# Patient Record
Sex: Female | Born: 1945
Health system: Southern US, Community
[De-identification: ages and names within clinical notes are randomized; demographics above are authoritative.]

## PROBLEM LIST (undated history)

## (undated) DIAGNOSIS — M4712 Other spondylosis with myelopathy, cervical region: Secondary | ICD-10-CM

## (undated) DIAGNOSIS — T8859XA Other complications of anesthesia, initial encounter: Secondary | ICD-10-CM

## (undated) DIAGNOSIS — F329 Major depressive disorder, single episode, unspecified: Secondary | ICD-10-CM

## (undated) DIAGNOSIS — I639 Cerebral infarction, unspecified: Secondary | ICD-10-CM

## (undated) DIAGNOSIS — R51 Headache: Secondary | ICD-10-CM

## (undated) DIAGNOSIS — E119 Type 2 diabetes mellitus without complications: Secondary | ICD-10-CM

## (undated) DIAGNOSIS — K859 Acute pancreatitis without necrosis or infection, unspecified: Secondary | ICD-10-CM

## (undated) DIAGNOSIS — R413 Other amnesia: Secondary | ICD-10-CM

## (undated) DIAGNOSIS — F32A Depression, unspecified: Secondary | ICD-10-CM

## (undated) DIAGNOSIS — I10 Essential (primary) hypertension: Secondary | ICD-10-CM

## (undated) DIAGNOSIS — Z973 Presence of spectacles and contact lenses: Secondary | ICD-10-CM

## (undated) DIAGNOSIS — R519 Headache, unspecified: Secondary | ICD-10-CM

## (undated) DIAGNOSIS — Z972 Presence of dental prosthetic device (complete) (partial): Secondary | ICD-10-CM

## (undated) DIAGNOSIS — T4145XA Adverse effect of unspecified anesthetic, initial encounter: Secondary | ICD-10-CM

## (undated) DIAGNOSIS — J449 Chronic obstructive pulmonary disease, unspecified: Secondary | ICD-10-CM

## (undated) DIAGNOSIS — R0600 Dyspnea, unspecified: Secondary | ICD-10-CM

## (undated) DIAGNOSIS — E1140 Type 2 diabetes mellitus with diabetic neuropathy, unspecified: Secondary | ICD-10-CM

## (undated) DIAGNOSIS — F419 Anxiety disorder, unspecified: Secondary | ICD-10-CM

## (undated) DIAGNOSIS — F209 Schizophrenia, unspecified: Secondary | ICD-10-CM

## (undated) HISTORY — PX: RHINOPLASTY: SUR1284

## (undated) HISTORY — PX: TONSILLECTOMY: SUR1361

## (undated) HISTORY — PX: FOOT SURGERY: SHX648

## (undated) HISTORY — PX: MULTIPLE TOOTH EXTRACTIONS: SHX2053

## (undated) HISTORY — PX: TUBAL LIGATION: SHX77

## (undated) HISTORY — PX: ROTATOR CUFF REPAIR: SHX139

---

## 2013-08-02 DIAGNOSIS — Q618 Other cystic kidney diseases: Secondary | ICD-10-CM | POA: Diagnosis not present

## 2013-08-02 DIAGNOSIS — L723 Sebaceous cyst: Secondary | ICD-10-CM | POA: Diagnosis not present

## 2013-08-02 DIAGNOSIS — K8689 Other specified diseases of pancreas: Secondary | ICD-10-CM | POA: Diagnosis not present

## 2013-08-02 DIAGNOSIS — K7689 Other specified diseases of liver: Secondary | ICD-10-CM | POA: Diagnosis not present

## 2013-08-02 DIAGNOSIS — N281 Cyst of kidney, acquired: Secondary | ICD-10-CM | POA: Diagnosis not present

## 2013-08-09 DIAGNOSIS — R05 Cough: Secondary | ICD-10-CM | POA: Diagnosis not present

## 2013-08-09 DIAGNOSIS — R059 Cough, unspecified: Secondary | ICD-10-CM | POA: Diagnosis not present

## 2013-08-09 DIAGNOSIS — J370 Chronic laryngitis: Secondary | ICD-10-CM | POA: Diagnosis not present

## 2013-08-09 DIAGNOSIS — R498 Other voice and resonance disorders: Secondary | ICD-10-CM | POA: Diagnosis not present

## 2013-08-09 DIAGNOSIS — R51 Headache: Secondary | ICD-10-CM | POA: Diagnosis not present

## 2014-05-01 DIAGNOSIS — F319 Bipolar disorder, unspecified: Secondary | ICD-10-CM | POA: Diagnosis not present

## 2014-05-01 DIAGNOSIS — F331 Major depressive disorder, recurrent, moderate: Secondary | ICD-10-CM | POA: Diagnosis not present

## 2014-05-01 DIAGNOSIS — E119 Type 2 diabetes mellitus without complications: Secondary | ICD-10-CM | POA: Diagnosis not present

## 2014-05-01 DIAGNOSIS — Z23 Encounter for immunization: Secondary | ICD-10-CM | POA: Diagnosis not present

## 2014-05-01 DIAGNOSIS — I119 Hypertensive heart disease without heart failure: Secondary | ICD-10-CM | POA: Diagnosis not present

## 2014-05-01 DIAGNOSIS — E782 Mixed hyperlipidemia: Secondary | ICD-10-CM | POA: Diagnosis not present

## 2014-05-01 DIAGNOSIS — R413 Other amnesia: Secondary | ICD-10-CM | POA: Diagnosis not present

## 2014-05-01 DIAGNOSIS — F3170 Bipolar disorder, currently in remission, most recent episode unspecified: Secondary | ICD-10-CM | POA: Diagnosis not present

## 2014-05-28 DIAGNOSIS — R6889 Other general symptoms and signs: Secondary | ICD-10-CM | POA: Diagnosis not present

## 2014-06-24 DIAGNOSIS — J019 Acute sinusitis, unspecified: Secondary | ICD-10-CM | POA: Diagnosis not present

## 2014-06-24 DIAGNOSIS — H6593 Unspecified nonsuppurative otitis media, bilateral: Secondary | ICD-10-CM | POA: Diagnosis not present

## 2014-06-24 DIAGNOSIS — R05 Cough: Secondary | ICD-10-CM | POA: Diagnosis not present

## 2014-10-16 DIAGNOSIS — K5901 Slow transit constipation: Secondary | ICD-10-CM | POA: Diagnosis not present

## 2014-10-16 DIAGNOSIS — I119 Hypertensive heart disease without heart failure: Secondary | ICD-10-CM | POA: Diagnosis not present

## 2014-10-16 DIAGNOSIS — G4700 Insomnia, unspecified: Secondary | ICD-10-CM | POA: Diagnosis not present

## 2014-10-16 DIAGNOSIS — F331 Major depressive disorder, recurrent, moderate: Secondary | ICD-10-CM | POA: Diagnosis not present

## 2014-10-16 DIAGNOSIS — F3170 Bipolar disorder, currently in remission, most recent episode unspecified: Secondary | ICD-10-CM | POA: Diagnosis not present

## 2014-10-16 DIAGNOSIS — Z23 Encounter for immunization: Secondary | ICD-10-CM | POA: Diagnosis not present

## 2014-10-16 DIAGNOSIS — E119 Type 2 diabetes mellitus without complications: Secondary | ICD-10-CM | POA: Diagnosis not present

## 2014-10-16 DIAGNOSIS — J45909 Unspecified asthma, uncomplicated: Secondary | ICD-10-CM | POA: Diagnosis not present

## 2014-10-16 DIAGNOSIS — R413 Other amnesia: Secondary | ICD-10-CM | POA: Diagnosis not present

## 2014-10-16 DIAGNOSIS — E782 Mixed hyperlipidemia: Secondary | ICD-10-CM | POA: Diagnosis not present

## 2015-04-20 ENCOUNTER — Encounter (HOSPITAL_COMMUNITY): Payer: Self-pay

## 2015-04-20 ENCOUNTER — Emergency Department (HOSPITAL_COMMUNITY)
Admission: EM | Admit: 2015-04-20 | Discharge: 2015-04-20 | Disposition: A | Discharge status: Home / Self Care | Payer: Medicare Other | Attending: Emergency Medicine | Admitting: Emergency Medicine

## 2015-04-20 ENCOUNTER — Emergency Department (HOSPITAL_COMMUNITY): Payer: Medicare Other

## 2015-04-20 DIAGNOSIS — S0990XA Unspecified injury of head, initial encounter: Secondary | ICD-10-CM | POA: Diagnosis present

## 2015-04-20 DIAGNOSIS — S40022A Contusion of left upper arm, initial encounter: Secondary | ICD-10-CM | POA: Diagnosis not present

## 2015-04-20 DIAGNOSIS — Y9389 Activity, other specified: Secondary | ICD-10-CM | POA: Insufficient documentation

## 2015-04-20 DIAGNOSIS — Y9289 Other specified places as the place of occurrence of the external cause: Secondary | ICD-10-CM | POA: Diagnosis not present

## 2015-04-20 DIAGNOSIS — Y998 Other external cause status: Secondary | ICD-10-CM | POA: Diagnosis not present

## 2015-04-20 DIAGNOSIS — S8011XA Contusion of right lower leg, initial encounter: Secondary | ICD-10-CM | POA: Diagnosis not present

## 2015-04-20 DIAGNOSIS — W19XXXA Unspecified fall, initial encounter: Secondary | ICD-10-CM

## 2015-04-20 DIAGNOSIS — Z87891 Personal history of nicotine dependence: Secondary | ICD-10-CM | POA: Diagnosis not present

## 2015-04-20 DIAGNOSIS — S0101XA Laceration without foreign body of scalp, initial encounter: Secondary | ICD-10-CM | POA: Insufficient documentation

## 2015-04-20 DIAGNOSIS — M542 Cervicalgia: Secondary | ICD-10-CM | POA: Diagnosis not present

## 2015-04-20 DIAGNOSIS — S199XXA Unspecified injury of neck, initial encounter: Secondary | ICD-10-CM | POA: Diagnosis not present

## 2015-04-20 DIAGNOSIS — S40021A Contusion of right upper arm, initial encounter: Secondary | ICD-10-CM | POA: Diagnosis not present

## 2015-04-20 DIAGNOSIS — E119 Type 2 diabetes mellitus without complications: Secondary | ICD-10-CM | POA: Diagnosis not present

## 2015-04-20 DIAGNOSIS — W01198A Fall on same level from slipping, tripping and stumbling with subsequent striking against other object, initial encounter: Secondary | ICD-10-CM | POA: Insufficient documentation

## 2015-04-20 DIAGNOSIS — I10 Essential (primary) hypertension: Secondary | ICD-10-CM | POA: Diagnosis not present

## 2015-04-20 DIAGNOSIS — Z8673 Personal history of transient ischemic attack (TIA), and cerebral infarction without residual deficits: Secondary | ICD-10-CM | POA: Diagnosis not present

## 2015-04-20 DIAGNOSIS — S0003XA Contusion of scalp, initial encounter: Secondary | ICD-10-CM | POA: Diagnosis not present

## 2015-04-20 HISTORY — DX: Type 2 diabetes mellitus without complications: E11.9

## 2015-04-20 HISTORY — DX: Essential (primary) hypertension: I10

## 2015-04-20 HISTORY — DX: Cerebral infarction, unspecified: I63.9

## 2015-04-20 LAB — CBG MONITORING, ED: GLUCOSE-CAPILLARY: 180 mg/dL — AB (ref 65–99)

## 2015-04-20 MED ORDER — LIDOCAINE HCL 2 % IJ SOLN
10.0000 mL | Freq: Once | INTRAMUSCULAR | Status: AC
  Administered 2015-04-20: 200 mg via INTRADERMAL
  Filled 2015-04-20: qty 20

## 2015-04-20 NOTE — ED Notes (Signed)
Pt fell this morning getting back to bed.  Struck back of head on something.  Has laceration.  Bleeding controlled.  Pt on blood thinner.  Pt states she had no LOC but fall was unwitnessed and pt states she could hear everyone but she "dozed off".  Spouse heard her and came to check on her.

## 2015-04-20 NOTE — ED Provider Notes (Signed)
LACERATION REPAIR Performed by: Simeon Craft Authorized by: Simeon Craft Consent: Verbal consent obtained. Risks and benefits: risks, benefits and alternatives were discussed Consent given by: patient Patient identity confirmed: provided demographic data Prepped and Draped in normal sterile fashion Wound explored  Laceration Location: Occipital scalp  Laceration Length: 3 cm  No Foreign Bodies seen or palpated  Anesthesia: local infiltration  Local anesthetic: lidocaine 2% without epinephrine  Anesthetic total: 6 ml  Irrigation method: syringe Amount of cleaning: standard  Skin closure: approximated well  Number of staples: 4  Technique: staples  Patient tolerance: Patient tolerated the procedure well with no immediate complications.  Alveta Heimlich, PA-C 04/20/15 1220  Doug Sou, MD 04/20/15 1610

## 2015-04-20 NOTE — ED Notes (Signed)
MD at bedside. 

## 2015-04-20 NOTE — ED Provider Notes (Signed)
CSN: [id number]     Arrival date & time 04/20/15  0806 History   First MD Initiated Contact with Patient 04/20/15 [id number]     Chief Complaint  Patient presents with  . Fall  . Head Laceration     (Consider location/radiation/quality/duration/timing/severity/associated sxs/prior Treatment) HPI Patient fell while getting into bed 5 AM today. She struck her occipital scalp on table next to her bed creating laceration. She complains of headache and neck pain since the event. She is acting as her normal self per her daughters who accompany her. No treatment prior to coming here. Pain is mild at present. No other associated symptoms. It is uncertain if patient is currently on blood thinners. Past Medical History  Diagnosis Date  . Hypertension   . Diabetes mellitus without complication   . Stroke    Past Surgical History  Procedure Laterality Date  . Tubal ligation    . Rotator cuff repair    . Rhinoplasty    . Foot surgery     History reviewed. No pertinent family history. Social History  Substance Use Topics  . Smoking status: Former Games developer  . Smokeless tobacco: None  . Alcohol Use: No   OB History    No data available     Review of Systems  Musculoskeletal: Positive for gait problem and neck pain.       Walks with walker  Neurological: Positive for headaches.      Allergies  Review of patient's allergies indicates no known allergies.  Home Medications   Prior to Admission medications   Not on File   BP 134/70 mmHg  Pulse 67  Temp(Src) 98 F (36.7 C) (Oral)  Resp 18  SpO2 99% Physical Exam  Constitutional: No distress.  Chronically ill-appearing  HENT:  Right Ear: External ear normal.  Left Ear: External ear normal.  3 cm stellate laceration at occiput. No surrounding hematoma.  Eyes: Conjunctivae are normal. Pupils are equal, round, and reactive to light.  Neck: Neck supple. No tracheal deviation present. No thyromegaly present.  Mild diffuse tenderness  posteriorly  Cardiovascular: Normal rate.   No murmur heard. Pulmonary/Chest: Effort normal and breath sounds normal.  Abdominal: Soft. Bowel sounds are normal. She exhibits no distension. There is no tenderness.  Musculoskeletal: Normal range of motion. She exhibits no edema or tenderness.  Pelvis stable and nontender. All 4 extremity is a contusion abrasion and tenderness neurovascular intact  Neurological: She is alert. Coordination normal.  Walks with minimal assistance. Her baseline gait per her daughter who accompanies her  Skin: Skin is warm and dry. No rash noted.  Psychiatric: She has a normal mood and affect.  Nursing note and vitals reviewed.   ED Course  Procedures (including critical care time) Labs Review Labs Reviewed - No data to display  Imaging Review No results found. I have personally reviewed and evaluated these images and lab results as part of my medical decision-making.   EKG Interpretation None     Scalp laceration repaired by Ms. Barrett. Results for orders placed or performed during the hospital encounter of 04/20/15  CBG monitoring, ED  Result Value Ref Range   Glucose-Capillary 180 (H) 65 - 99 mg/dL   Comment 1 Notify RN    Comment 2 Document in Chart    Ct Head Wo Contrast  04/20/2015   CLINICAL DATA:  Fall this morning. Blunt trauma to back of head. Head and neck pain. On anticoagulation.  EXAM: CT HEAD WITHOUT CONTRAST  CT  CERVICAL SPINE WITHOUT CONTRAST  TECHNIQUE: Multidetector CT imaging of the head and cervical spine was performed following the standard protocol without intravenous contrast. Multiplanar CT image reconstructions of the cervical spine were also generated.  COMPARISON:  None.  FINDINGS: CT HEAD FINDINGS  There is no evidence of intracranial hemorrhage, brain edema, or other signs of acute infarction. There is no evidence of intracranial mass lesion or mass effect. No abnormal extraaxial fluid collections are identified.  Mild  chronic small vessel disease noted. No evidence of hydrocephalus. Mild posterior scalp soft tissue swelling and subcutaneous emphysema noted however there is no evidence of skull fracture or pneumocephalus.  CT CERVICAL SPINE FINDINGS  No evidence of acute fracture, subluxation, or prevertebral soft tissue swelling.  New moderate to severe degenerative disc disease seen at C3-4, C5-6, and C6-7. Mild facet DJD also seen bilaterally at multiple levels. Atlantoaxial degenerative changes noted. In addition, calcified disc herniations and spinal canal stenosis seen at levels of C3-4, C4-5, C5-6, and C6-7.  IMPRESSION: Posterior scalp hematoma. No evidence of skull fracture or acute intracranial findings. Mild chronic small vessel disease.  No evidence of acute cervical spine fracture or subluxation.  Degenerative cervical spondylosis, as described above. Calcified posterior disc herniations and spinal canal stenosis seen at levels of C3 through C7.   Electronically Signed   By: Myles Rosenthal M.D.   On: 04/20/2015 10:15   Ct Cervical Spine Wo Contrast  04/20/2015   CLINICAL DATA:  Fall this morning. Blunt trauma to back of head. Head and neck pain. On anticoagulation.  EXAM: CT HEAD WITHOUT CONTRAST  CT CERVICAL SPINE WITHOUT CONTRAST  TECHNIQUE: Multidetector CT imaging of the head and cervical spine was performed following the standard protocol without intravenous contrast. Multiplanar CT image reconstructions of the cervical spine were also generated.  COMPARISON:  None.  FINDINGS: CT HEAD FINDINGS  There is no evidence of intracranial hemorrhage, brain edema, or other signs of acute infarction. There is no evidence of intracranial mass lesion or mass effect. No abnormal extraaxial fluid collections are identified.  Mild chronic small vessel disease noted. No evidence of hydrocephalus. Mild posterior scalp soft tissue swelling and subcutaneous emphysema noted however there is no evidence of skull fracture or  pneumocephalus.  CT CERVICAL SPINE FINDINGS  No evidence of acute fracture, subluxation, or prevertebral soft tissue swelling.  New moderate to severe degenerative disc disease seen at C3-4, C5-6, and C6-7. Mild facet DJD also seen bilaterally at multiple levels. Atlantoaxial degenerative changes noted. In addition, calcified disc herniations and spinal canal stenosis seen at levels of C3-4, C4-5, C5-6, and C6-7.  IMPRESSION: Posterior scalp hematoma. No evidence of skull fracture or acute intracranial findings. Mild chronic small vessel disease.  No evidence of acute cervical spine fracture or subluxation.  Degenerative cervical spondylosis, as described above. Calcified posterior disc herniations and spinal canal stenosis seen at levels of C3 through C7.   Electronically Signed   By: Myles Rosenthal M.D.   On: 04/20/2015 10:15    MDM  Plan Cherlynn Polo out one week. Tylenol for pain. She'll be given resource guide and [facility] as referrals for to get Medicare physician Diagnosis #1 fall #2 minor closed head trauma #3 scalp laceration  #4 hyperglycemia  Final diagnoses:  None        Doug Sou, MD 04/20/15 1139

## 2015-04-20 NOTE — Discharge Instructions (Signed)
Fall Prevention and Home Safety Take Tylenol as directed for pain. You can wash your hair normally in 24 hours. Staples to come out in 1 week. You can go to any urgent care center to get them out. Call the [facility] and community wellness center or any of the numbers on the resource guide to get a primary care physician Falls cause injuries and can affect all age groups. It is possible to use preventive measures to significantly decrease the likelihood of falls. There are many simple measures which can make your home safer and prevent falls. OUTDOORS  Repair cracks and edges of walkways and driveways.  Remove high doorway thresholds.  Trim shrubbery on the main path into your home.  Have good outside lighting.  Clear walkways of tools, rocks, debris, and clutter.  Check that handrails are not broken and are securely fastened. Both sides of steps should have handrails.  Have leaves, snow, and ice cleared regularly.  Use sand or salt on walkways during winter months.  In the garage, clean up grease or oil spills. BATHROOM  Install night lights.  Install grab bars by the toilet and in the tub and shower.  Use non-skid mats or decals in the tub or shower.  Place a plastic non-slip stool in the shower to sit on, if needed.  Keep floors dry and clean up all water on the floor immediately.  Remove soap buildup in the tub or shower on a regular basis.  Secure bath mats with non-slip, double-sided rug tape.  Remove throw rugs and tripping hazards from the floors. BEDROOMS  Install night lights.  Make sure a bedside light is easy to reach.  Do not use oversized bedding.  Keep a telephone by your bedside.  Have a firm chair with side arms to use for getting dressed.  Remove throw rugs and tripping hazards from the floor. KITCHEN  Keep handles on pots and pans turned toward the center of the stove. Use back burners when possible.  Clean up spills quickly and allow time  for drying.  Avoid walking on wet floors.  Avoid hot utensils and knives.  Position shelves so they are not too high or low.  Place commonly used objects within easy reach.  If necessary, use a sturdy step stool with a grab bar when reaching.  Keep electrical cables out of the way.  Do not use floor polish or wax that makes floors slippery. If you must use wax, use non-skid floor wax.  Remove throw rugs and tripping hazards from the floor. STAIRWAYS  Never leave objects on stairs.  Place handrails on both sides of stairways and use them. Fix any loose handrails. Make sure handrails on both sides of the stairways are as long as the stairs.  Check carpeting to make sure it is firmly attached along stairs. Make repairs to worn or loose carpet promptly.  Avoid placing throw rugs at the top or bottom of stairways, or properly secure the rug with carpet tape to prevent slippage. Get rid of throw rugs, if possible.  Have an electrician put in a light switch at the top and bottom of the stairs. OTHER FALL PREVENTION TIPS  Wear low-heel or rubber-soled shoes that are supportive and fit well. Wear closed toe shoes.  When using a stepladder, make sure it is fully opened and both spreaders are firmly locked. Do not climb a closed stepladder.  Add color or contrast paint or tape to grab bars and handrails in your  home. Place contrasting color strips on first and last steps.  Learn and use mobility aids as needed. Install an electrical emergency response system.  Turn on lights to avoid dark areas. Replace light bulbs that burn out immediately. Get light switches that glow.  Arrange furniture to create clear pathways. Keep furniture in the same place.  Firmly attach carpet with non-skid or double-sided tape.  Eliminate uneven floor surfaces.  Select a carpet pattern that does not visually hide the edge of steps.  Be aware of all pets. OTHER HOME SAFETY TIPS  Set the water  temperature for 120 F (48.8 C).  Keep emergency numbers on or near the telephone.  Keep smoke detectors on every level of the home and near sleeping areas. Document Released: 07/08/2002 Document Revised: 01/17/2012 Document Reviewed: 10/07/2011 [facility] Patient Information 2015 [place], [place]. This information is not intended to replace advice given to you by your health care provider. Make sure you discuss any questions you have with your health care provider.  Emergency Department Resource Guide 1) Find a Doctor and Pay Out of Pocket Although you won't have to find out who is covered by your insurance plan, it is a good idea to ask around and get recommendations. You will then need to call the office and see if the doctor you have chosen will accept you as a new patient and what types of options they offer for patients who are self-pay. Some doctors offer discounts or will set up payment plans for their patients who do not have insurance, but you will need to ask so you aren't surprised when you get to your appointment.  2) Contact Your Local Health Department Not all health departments have doctors that can see patients for sick visits, but many do, so it is worth a call to see if yours does. If you don't know where your local health department is, you can check in your phone book. The CDC also has a tool to help you locate your state's health department, and many state websites also have listings of all of their local health departments.  3) Find a Walk-in Clinic If your illness is not likely to be very severe or complicated, you may want to try a walk in clinic. These are popping up all over the country in pharmacies, drugstores, and shopping centers. They're usually staffed by nurse practitioners or physician assistants that have been trained to treat common illnesses and complaints. They're usually fairly quick and inexpensive. However, if you have serious medical issues or chronic medical  problems, these are probably not your best option.  No Primary Care Doctor: - Call Health Connect at  [phone] - they can help you locate a primary care doctor that  accepts your insurance, provides certain services, etc. - Physician Referral Service- [phone]  Chronic Pain Problems: Organization         Address  Phone   Notes  [facility]  [phone] Patients need to be referred by their primary care doctor.   Medication Assistance: Organization         Address  Phone   Notes  [facility] Medication [facility] [address] [place] [place], [place] [address] [phone] --Must be a resident of [facility] -- Must have NO insurance coverage whatsoever (no Medicaid/ Medicare, etc.) -- The pt. MUST have a primary care doctor that directs their care regularly and follows them in the community   [facility]  [phone]  [employer]  [phone]    Agencies that provide inexpensive medical care: Organization         Address  Phone   Notes  [facility]  [phone]   [facility]    [phone]   [facility] [address] [place], [place] [address] [phone]   [facility] [address] [place] [place] [phone]   Planned Parenthood    [phone]   [facility]    [phone]   [facility]  [address], [place] Phone:  [phone], Fax:  [phone] Hours of Operation:  9 am - 6 pm, M-F.  Also accepts Medicaid/Medicare and self-pay.  [facility]  [address], [place] Phone: [phone], Fax: [phone]. Hours of Operation:  8:30 am - 5:30 pm, M-F.  Also accepts Medicaid and self-pay.  [facility] [place] [address] [place] Phone: [phone]   [facility] [address] Natasha Bence [place], [place] [phone], Ext. [phone]  Mondays & Thursdays: 7-9 AM.  First 15 patients are seen on a first come, first serve basis.    Medicaid-accepting [facility] Providers:  Organization         Address  Phone   Notes  [facility] [address], [place] [phone] Also accepts self-pay patients.  [facility] [address] Laurell Josephs [place], [place]  [phone]   [facility] [address], [place] [phone]   [facility] [address], [place] [phone]   Renaye Rakers [address], [place]   [phone] Only accepts [place] Access [place] patients after they have their name applied to their card.   Self-Pay (no insurance) in [facility]:  Organization         Address  Phone   Notes  Sickle Cell Patients, [facility] Internal Medicine [address] [place], [place] [phone]   [facility] [address] [place], [place] [phone]   [facility] [place]  [address], [place] [phone]   [facility]/Dr. Osei-Bonsu  [address] [place] or [address], [place] [phone] Phone number for both [place] and [place] locations is the same.  Urgent Medical and [facility] [address], [place] [phone]   [facility] [address], [place] or [address] [phone]   [facility] [address], [place] [phone], phone; [phone], fax Sees patients 1st and 3rd Saturday of every month.  Must not qualify for public or private insurance (i.e. Medicaid, Medicare, [place] Health Choice, Veterans' Benefits)  Household income should be no more than 200% of the poverty level The clinic cannot treat you if you are pregnant or think you are pregnant  Sexually transmitted diseases are not treated at  the clinic.    Dental Care: Organization         Address  Phone  Notes  [facility] [address] [place], [place] [phone] Accepts children up to age 77 who are enrolled in [place] or [place] Health Choice; pregnant women with a Medicaid card;  and children who have applied for Medicaid or [place] Health Choice, but were declined, whose parents can pay a reduced fee at time of service.  [facility]  [address], [place] [phone] Accepts children up to age 109 who are enrolled in [place] or [place] Health Choice; pregnant women with a Medicaid card; and children who have applied for Medicaid or [place] Health Choice, but were declined, whose parents can pay a reduced fee at time of service.  [facility]  [address] [place], [place] [phone] Patients are seen by appointment only. Walk-ins are not accepted. [facility] will see patients 2 years of age and older. Monday - Tuesday (8am-5pm) Most Wednesdays (8:30-5pm) $30 per visit, cash only  [facility] Adult Dental Access PRO[facility]  [address], [facility] [phone] Patients are seen by appointment only. Walk-ins are not accepted. [facility] will see patients 53 years of age and older. One Wednesday Evening (Monthly: Volunteer Based).  $30 per visit, cash only  [employer]  [phone] for adults; Children under age 47, call Graduate Pediatric Dentistry at [phone]. Children aged 67-14, please call [phone] to request a pediatric application.  Dental services are provided in all areas of dental care including fillings, crowns and bridges, complete and partial dentures, implants, gum treatment, root canals, and extractions. Preventive care is also provided. Treatment is provided to both adults and children. Patients are selected via a lottery and there is often a  waiting list.   [facility] [address], [place]  [phone] [email or web address]   [facility] [address] [place], [place] [phone], Ext. [phone] Second and Fourth Thursday of each month, opens at 6:30 AM; Clinic ends at 9 AM.  Patients are seen on a first-come first-served basis, and a limited number are seen during each clinic.   [facility]  [address] Ether Griffins [place], [place] [phone]   Eligibility Requirements You must have lived in [place], [place], or [place] counties for at least the last three months.   You cannot be eligible for state or federal sponsored [place], including [employer], [place], or [employer].   You generally cannot be eligible for healthcare insurance through your employer.    How to apply: Eligibility screenings are held every Tuesday and Wednesday afternoon from 1:00 pm until 4:00 pm. You do not need an appointment for the interview!  [facility] [address] [place], [place] [phone]   [facility] Health Department  [phone]   [facility] Health Department  [phone]   [facility]  [phone]    Behavioral Health Resources in the Community: Intensive Outpatient Programs Organization         Address  Phone  Notes  [facility] [address], [place], [place] [phone]   [facility] Outpatient [address] [place], [place] [phone]   [facility] [address] [place], [place]  [phone]   [facility] Mental Health [address],  [place], [place] [phone] or [phone]   Substance Abuse Resources Organization         Address  Phone  Notes  [facility]  [phone]   [facility]  [phone]   [facility]  [phone]   [facility]  [phone]   Residential &  Outpatient Substance Abuse  Program  [phone]   Psychological Services Organization         Address  Phone  Notes  [facility] Behavioral Health  [phone]   [facility]  [phone]   [facility] Mental Health [phone] [address] [place] [phone] or [phone]    Mobile Crisis Teams Organization         Address  Phone  Notes  [facility], Mobile Crisis Care Unit  [phone]   [facility]  [address] [place], [place] [phone]   Doristine Locks [address] [place] [phone]    Self-Help/Support Groups Organization         Address  Phone             Notes  [facility] [place] - variety of support groups  336- I7437963 Call for more information  Narcotics Anonymous (NA), [facility] [address], [employer] [place]  2 meetings at this location   Statistician         Address  Phone  Notes  [facility] [address],    [place]  [phone]   [facility]  [address], [place] [address], [place], [place] [phone]   [facility] [address] [place], [place] [phone] Admissions: 8am-3pm M-F  [facility] [address],    [place], [place] [phone]   [facility] [address] [place], [place], [place] [phone]   [facility] [address],  [place], [place] [phone]   [facility] - Intensive Outpatient [address] Laurell Josephs [address], [place], [place] [phone]   [facility] (Addiction [facility] [address] [place]  [place], [place] [phone] or [phone]   [facility] ([facility]) [address] [place], [place] [phone] Accepts Medicaid  [facility] [place] [address],  [place] [phone] Substance Abuse/Addiction Treatment   [facility] Organization          Address  Phone  Notes  [facility]  [phone]   Angie Fava, PhD [address] Ervin Knack [place], [place]   [phone] or [phone]   [facility] Behavioral   [address] [place], [place] [phone]   [facility] [address], [place], [place] [phone] Insurance/Medicaid/sponsorship through [facility] and Families [address]                                    [place], [place] [phone] Therapy/tele-psych/case  [facility] [address], [place] [phone]    Dr. Lolly Mustache  [phone]   [facility]  [employer] [facility] Dept. 1) [address] [place] 2) [address] [place] 3)  [address], [place] [phone]  [phone]   [facility] Child Abuse Hotline 669-409-6565 or [phone] (After Hours)

## 2015-04-20 NOTE — ED Notes (Signed)
Patient transported to CT 

## 2015-04-27 DIAGNOSIS — R112 Nausea with vomiting, unspecified: Secondary | ICD-10-CM | POA: Diagnosis not present

## 2015-04-27 DIAGNOSIS — Z4802 Encounter for removal of sutures: Secondary | ICD-10-CM | POA: Diagnosis not present

## 2015-05-15 DIAGNOSIS — M6281 Muscle weakness (generalized): Secondary | ICD-10-CM | POA: Diagnosis not present

## 2015-05-15 DIAGNOSIS — F331 Major depressive disorder, recurrent, moderate: Secondary | ICD-10-CM | POA: Diagnosis not present

## 2015-05-15 DIAGNOSIS — R42 Dizziness and giddiness: Secondary | ICD-10-CM | POA: Diagnosis not present

## 2015-05-15 DIAGNOSIS — E538 Deficiency of other specified B group vitamins: Secondary | ICD-10-CM | POA: Diagnosis not present

## 2015-05-15 DIAGNOSIS — I10 Essential (primary) hypertension: Secondary | ICD-10-CM | POA: Diagnosis not present

## 2015-05-15 DIAGNOSIS — Z23 Encounter for immunization: Secondary | ICD-10-CM | POA: Diagnosis not present

## 2015-05-15 DIAGNOSIS — Z79899 Other long term (current) drug therapy: Secondary | ICD-10-CM | POA: Diagnosis not present

## 2015-05-15 DIAGNOSIS — E119 Type 2 diabetes mellitus without complications: Secondary | ICD-10-CM | POA: Diagnosis not present

## 2016-01-01 DIAGNOSIS — Z7984 Long term (current) use of oral hypoglycemic drugs: Secondary | ICD-10-CM | POA: Diagnosis not present

## 2016-01-01 DIAGNOSIS — Z79899 Other long term (current) drug therapy: Secondary | ICD-10-CM | POA: Diagnosis not present

## 2016-01-01 DIAGNOSIS — E7800 Pure hypercholesterolemia, unspecified: Secondary | ICD-10-CM | POA: Diagnosis not present

## 2016-01-01 DIAGNOSIS — K5901 Slow transit constipation: Secondary | ICD-10-CM | POA: Diagnosis not present

## 2016-01-01 DIAGNOSIS — E119 Type 2 diabetes mellitus without complications: Secondary | ICD-10-CM | POA: Diagnosis not present

## 2016-01-01 DIAGNOSIS — R269 Unspecified abnormalities of gait and mobility: Secondary | ICD-10-CM | POA: Diagnosis not present

## 2016-01-01 DIAGNOSIS — E538 Deficiency of other specified B group vitamins: Secondary | ICD-10-CM | POA: Diagnosis not present

## 2016-01-01 DIAGNOSIS — F331 Major depressive disorder, recurrent, moderate: Secondary | ICD-10-CM | POA: Diagnosis not present

## 2016-01-01 DIAGNOSIS — E1165 Type 2 diabetes mellitus with hyperglycemia: Secondary | ICD-10-CM | POA: Diagnosis not present

## 2016-01-26 DIAGNOSIS — J069 Acute upper respiratory infection, unspecified: Secondary | ICD-10-CM | POA: Diagnosis not present

## 2016-01-26 DIAGNOSIS — R269 Unspecified abnormalities of gait and mobility: Secondary | ICD-10-CM | POA: Diagnosis not present

## 2016-02-19 ENCOUNTER — Encounter: Payer: Self-pay | Admitting: Neurology

## 2016-02-19 ENCOUNTER — Ambulatory Visit (INDEPENDENT_AMBULATORY_CARE_PROVIDER_SITE_OTHER): Payer: Medicare Other | Admitting: Neurology

## 2016-02-19 ENCOUNTER — Other Ambulatory Visit (INDEPENDENT_AMBULATORY_CARE_PROVIDER_SITE_OTHER): Payer: Medicare Other

## 2016-02-19 VITALS — BP 140/84 | HR 64 | Ht 64.0 in | Wt 134.2 lb

## 2016-02-19 DIAGNOSIS — F1010 Alcohol abuse, uncomplicated: Secondary | ICD-10-CM

## 2016-02-19 DIAGNOSIS — F1011 Alcohol abuse, in remission: Secondary | ICD-10-CM

## 2016-02-19 DIAGNOSIS — H5500 Unspecified nystagmus: Secondary | ICD-10-CM

## 2016-02-19 DIAGNOSIS — R292 Abnormal reflex: Secondary | ICD-10-CM

## 2016-02-19 DIAGNOSIS — R4189 Other symptoms and signs involving cognitive functions and awareness: Secondary | ICD-10-CM | POA: Diagnosis not present

## 2016-02-19 DIAGNOSIS — G959 Disease of spinal cord, unspecified: Secondary | ICD-10-CM | POA: Diagnosis not present

## 2016-02-19 LAB — C-REACTIVE PROTEIN: CRP: 0.1 mg/dL — AB (ref 0.5–20.0)

## 2016-02-19 LAB — SEDIMENTATION RATE: Sed Rate: 10 mm/hr (ref 0–30)

## 2016-02-19 NOTE — Addendum Note (Signed)
Addended by: Vivien RotaRIVER, Kalub Morillo H on: 4/09/81197/21/2017 02:36 PM   Modules accepted: Orders

## 2016-02-19 NOTE — Progress Notes (Signed)
Note routed

## 2016-02-19 NOTE — Patient Instructions (Signed)
1.  Check blood work 2.  MRI brain and cervical spine 3.  Home health referral will be made 4.  Please call my office with the name and number of her previous psychiatrist so we can obtain their records  We will call you with the results of the testing and determine the next step

## 2016-02-19 NOTE — Progress Notes (Signed)
[facility] Clinic Note - Initial Visit   Date: 02/19/2016  [name] MRN: [id number] DOB: 03/08/1946   Dear Dr. Lauretta Grill:  Thank you for your kind referral of [name] for consultation of bilateral leg weakness and gait instability. Although her history is well known to you, please allow [place] to reiterate it for the purpose of our medical record. The patient was accompanied to the clinic by daughter who also provides collateral information.     History of Present Illness: [name] is a 70 y.o. right-handed [place] American female with hypertension, hyperlipidemia, diabetes mellitus, vitamin B12 deficiency, stroke with residual dysarthria and dysphagia (2014), prior tobacco use, and paranoid schizophrenia presenting for evaluation of gait problems and falls.  She had a stroke in 2014 which manifested with right face and arm weakness, and dysarthria.  Her weakness improved, but she was left with difficulty with speech and swallowing.   Starting ~ 2015, she began having stiffness and difficulty with her walking.  She started using a cane but over the past year has been using rollator.  She fallen four this year with superficial injuries, including scalp laceration which required sutures.   She does not have any pain and denies numbness/tingling of the legs.  She endorses slowed movements, constipation, and vivid dreams.  Smell is intact.  Mood is "up and down".  She has been seeing a psychiatrist for schizophrenia and takes ziprasidone 3m daily.  Her prior antipsychotic medications are unknown.   She used to work in [employer]and retired at the age of 70    She used to drink 1 pint - 1 quart daily for 50 years, but quit drinking in 2013.  She also previously smoked 1-2ppd x 50 years.  When she moved in to live with her daughters, she quit drinking and smoking.  [place]    Out-side paper records, electronic medical record, and images have been reviewed where available and summarized as:   Labs 01/04/2016:  HbA1c 7.5, TSH 1.51, vitamin B12 260  Past Medical History  Diagnosis Date  . Hypertension   . [place]   . Stroke [facility]     Past Surgical History  Procedure Laterality Date  . Tubal ligation    . Rotator cuff repair    . Rhinoplasty    . Foot surgery       Medications:  Outpatient Encounter Prescriptions as of 02/19/2016  Medication Sig Note  . albuterol (PROVENTIL) (2.5 MG/3ML) 0.083% nebulizer solution Take 2.5 mg by nebulization every 6 (six) hours as needed for wheezing or shortness of breath.   .Marland KitchenamLODipine-benazepril (LOTREL) 5-10 MG per capsule Take 1 capsule by mouth daily.   .Marland Kitchenaspirin EC 81 MG tablet Take 81 mg by mouth daily.   . DULoxetine (CYMBALTA) 60 MG capsule Take 60 mg by mouth daily.   . fluticasone (FLONASE) 50 MCG/ACT nasal spray Place 1 spray into both nostrils daily.   . Linaclotide (LINZESS) 145 MCG CAPS capsule Take 145 mcg by mouth daily.   . meclizine (ANTIVERT) 25 MG tablet Take 25 mg by mouth 3 (three) times daily as needed for dizziness.   . metFORMIN (GLUCOPHAGE) 500 MG tablet Take 500 mg by mouth 2 (two) times daily with a meal.   . rosuvastatin (CRESTOR) 20 MG tablet Take 20 mg by mouth daily.   .Marland KitchentiZANidine (ZANAFLEX) 4 MG tablet Take 4 mg  by mouth every 6 (six) hours as needed for muscle spasms.   . traZODone (DESYREL) 150 MG tablet Take 150 mg by mouth at bedtime.   . ziprasidone (GEODON) 80 MG capsule Take 80 mg by mouth at bedtime.    . [DISCONTINUED] Cyanocobalamin (VITAMIN B-12 IJ) Inject 1,000 mcg as directed every 30 (thirty) days. 04/20/2015: 03/02/2015  . [DISCONTINUED] potassium chloride SA (K-DUR,KLOR-CON) 20 MEQ tablet Take 40 mEq by mouth daily.    No facility-administered encounter medications on file as of 02/19/2016.     Allergies: No Known  Allergies  Family History: No family history on file.  Social History: Social History  Substance Use Topics  . Smoking status: Former Research scientist (life sciences)  . Smokeless tobacco: Never Used  . Alcohol Use: No   Social History   Social History Narrative   Lives with 2 sisters in a one story home.  Retired from [employer] work.  Education: GED     Review of Systems:  CONSTITUTIONAL: No fevers, chills, night sweats, or weight loss.   EYES: No visual changes or eye pain ENT: No hearing changes.  No history of nose bleeds.   RESPIRATORY: No cough, wheezing and shortness of breath.   CARDIOVASCULAR: Negative for chest pain, and palpitations.   GI: Negative for abdominal discomfort, blood in stools or black stools.  No recent change in bowel habits.   GU:  No history of incontinence.   MUSCLOSKELETAL: No history of joint pain or swelling.  No myalgias.   SKIN: Negative for lesions, rash, and itching.   HEMATOLOGY/ONCOLOGY: Negative for prolonged bleeding, bruising easily, and swollen nodes.  No history of cancer.   ENDOCRINE: Negative for cold or heat intolerance, polydipsia or goiter.   PSYCH:  +depression or anxiety symptoms.   NEURO: As Above.   Vital Signs:  BP 140/84 mmHg  Pulse 64  Ht 5' 4" (1.626 m)  Wt 134 lb 3 oz (60.867 kg)  BMI 23.02 kg/m2  SpO2 98% Pain Scale: 0 on a scale of 0-10   General Medical Exam:   General:  Thin-appearing, comfortable.   Eyes/ENT: see cranial nerve examination.   Neck: No masses appreciated.  Full range of motion without tenderness.  No carotid bruits. Respiratory:  Clear to auscultation, good air entry bilaterally.   Cardiac:  Regular rate and rhythm, no murmur.   Extremities:  No deformities, edema, or skin discoloration.  Skin:  No rashes or lesions.  Neurological Exam: MENTAL STATUS including orientation to time, place, person, recent and remote memory, attention span and concentration, language, and fund of knowledge is fair. Speech is mildly  dysarthric. Montreal Cognitive Assessment  02/19/2016  Visuospatial/ Executive (0/5) 2  Naming (0/3) 2  Attention: Read list of digits (0/2) 2  Attention: Read list of letters (0/1) 1  Attention: Serial 7 subtraction starting at 100 (0/3) 1  Language: Repeat phrase (0/2) 2  Language : Fluency (0/1) 0  Abstraction (0/2) 1  Delayed Recall (0/5) 1  Orientation (0/6) 5  Total 17  Adjusted Score (based on education) 18    CRANIAL NERVES: II:  No visual field defects.  Unremarkable fundi.   III-IV-VI: Pupils equal round and reactive to light.  Normal conjugate, extra-ocular eye movements in all directions of gaze, except mildly restricted upgaze.  There is nystagmus in horizontal gaze > vertical gaze. Square wave jerks are present.  No ptosis.   V:  Normal facial sensation.  Jaw jerk is present.   VII:  Normal facial symmetry  and movements. Pathologic facial reflexes are present.  VIII:  Normal hearing and vestibular function.   IX-X:  Normal palatal movement.   XI:  Normal shoulder shrug and head rotation.   XII:  Normal tongue strength and range of motion, no deviation or fasciculation.  MOTOR:  Reduced muscle bulk throughout. No fasciculations or abnormal movements.  No pronator drift.  Motor strength is 5/5 throughout. Tone is shows cogwheel rigidity in the upper extremities (1) and spastic catch in the lower extremities (1+).  MSRs:  Reflexes are brisk 3+/4 throughout and 2+/4 at the ankles bilaterally.  Plantar responses are down going.  SENSORY:  Normal and symmetric perception of light touch, pinprick, and vibration.    COORDINATION/GAIT: Normal finger-to- nose-finger.  Bradykinesia with finger tapping bilaterally.  She is unable to rise from a chair without using arms.  Gait severely spastic appearing and unstable.    IMPRESSION: Ms. Cifuentes is a 70 year-old female with referred for evaluation of gait instability and falls.  Her exam shows severely spastic gait, brisk reflexes,  and increased tone of the limbs.  A structural lesion involving the brain and cervical spine needs to be excluded.  If this is unrevealing, the possibility of a neurodegenerative condition along the parkinson-plus spectrum (?progressive supranuclear palsy) is considered.  She does have a history of vitamin B12 deficiency which can cause subacute combined degeneration of the spinal cord, but I would expect more sensory involvement and her recent B12 was within normal limits.   PLAN/RECOMMENDATIONS:  1.  MRI brain and cervical spine wwo contrast 2.  Check copper, MMA, zinc, ESR, ANA, CRP, vitamin B1 3.  Home health referral for PT, OT, and medication management 4.  Consider neuropsychological testing 5.  Records from her prior psychiatrist will be requested to see what antipsychotics she has taken  Return to clinic in 2 months.   The duration of this appointment visit was 60 minutes of face-to-face time with the patient.  Greater than 50% of this time was spent in counseling, explanation of diagnosis, planning of further management, and coordination of care.   Thank you for allowing me to participate in patient's care.  If I can answer any additional questions, I would be pleased to do so.    Sincerely,    Garlin Batdorf K. Posey Pronto, DO

## 2016-02-22 LAB — ANA: Anti Nuclear Antibody(ANA): NEGATIVE

## 2016-02-22 LAB — ZINC: Zinc: 79 ug/dL (ref 60–130)

## 2016-02-23 LAB — COPPER, SERUM: Copper: 114 ug/dL (ref 72–166)

## 2016-02-24 LAB — VITAMIN B1: Vitamin B1 (Thiamine): 7 nmol/L — ABNORMAL LOW (ref 8–30)

## 2016-02-24 LAB — METHYLMALONIC ACID, SERUM: METHYLMALONIC ACID, QUANT: 138 nmol/L (ref 87–318)

## 2016-02-25 DIAGNOSIS — R261 Paralytic gait: Secondary | ICD-10-CM | POA: Diagnosis not present

## 2016-02-25 DIAGNOSIS — I69322 Dysarthria following cerebral infarction: Secondary | ICD-10-CM | POA: Diagnosis not present

## 2016-02-25 DIAGNOSIS — E119 Type 2 diabetes mellitus without complications: Secondary | ICD-10-CM | POA: Diagnosis not present

## 2016-02-25 DIAGNOSIS — Z7982 Long term (current) use of aspirin: Secondary | ICD-10-CM | POA: Diagnosis not present

## 2016-02-25 DIAGNOSIS — Z7984 Long term (current) use of oral hypoglycemic drugs: Secondary | ICD-10-CM | POA: Diagnosis not present

## 2016-02-25 DIAGNOSIS — I10 Essential (primary) hypertension: Secondary | ICD-10-CM | POA: Diagnosis not present

## 2016-02-25 DIAGNOSIS — H5500 Unspecified nystagmus: Secondary | ICD-10-CM | POA: Diagnosis not present

## 2016-02-25 DIAGNOSIS — G959 Disease of spinal cord, unspecified: Secondary | ICD-10-CM | POA: Diagnosis not present

## 2016-02-25 DIAGNOSIS — R296 Repeated falls: Secondary | ICD-10-CM | POA: Diagnosis not present

## 2016-02-25 DIAGNOSIS — I69331 Monoplegia of upper limb following cerebral infarction affecting right dominant side: Secondary | ICD-10-CM | POA: Diagnosis not present

## 2016-02-25 DIAGNOSIS — Z87891 Personal history of nicotine dependence: Secondary | ICD-10-CM | POA: Diagnosis not present

## 2016-02-25 DIAGNOSIS — I69392 Facial weakness following cerebral infarction: Secondary | ICD-10-CM | POA: Diagnosis not present

## 2016-02-25 DIAGNOSIS — I69391 Dysphagia following cerebral infarction: Secondary | ICD-10-CM | POA: Diagnosis not present

## 2016-02-25 DIAGNOSIS — F200 Paranoid schizophrenia: Secondary | ICD-10-CM | POA: Diagnosis not present

## 2016-02-25 DIAGNOSIS — Z8659 Personal history of other mental and behavioral disorders: Secondary | ICD-10-CM | POA: Diagnosis not present

## 2016-02-25 NOTE — Progress Notes (Signed)
Patient's daughter given results and instructions.

## 2016-03-02 DIAGNOSIS — R296 Repeated falls: Secondary | ICD-10-CM | POA: Diagnosis not present

## 2016-03-02 DIAGNOSIS — G959 Disease of spinal cord, unspecified: Secondary | ICD-10-CM | POA: Diagnosis not present

## 2016-03-02 DIAGNOSIS — R261 Paralytic gait: Secondary | ICD-10-CM | POA: Diagnosis not present

## 2016-03-02 DIAGNOSIS — I69331 Monoplegia of upper limb following cerebral infarction affecting right dominant side: Secondary | ICD-10-CM | POA: Diagnosis not present

## 2016-03-02 DIAGNOSIS — E119 Type 2 diabetes mellitus without complications: Secondary | ICD-10-CM | POA: Diagnosis not present

## 2016-03-02 DIAGNOSIS — H5500 Unspecified nystagmus: Secondary | ICD-10-CM | POA: Diagnosis not present

## 2016-03-04 ENCOUNTER — Other Ambulatory Visit: Payer: Medicare Other

## 2016-03-04 ENCOUNTER — Ambulatory Visit
Admission: RE | Admit: 2016-03-04 | Discharge: 2016-03-04 | Disposition: A | Discharge status: Home / Self Care | Payer: Medicare Other | Source: Ambulatory Visit | Attending: Neurology | Admitting: Neurology

## 2016-03-04 ENCOUNTER — Telehealth: Payer: Self-pay | Admitting: Neurology

## 2016-03-04 DIAGNOSIS — F1011 Alcohol abuse, in remission: Secondary | ICD-10-CM

## 2016-03-04 DIAGNOSIS — R4189 Other symptoms and signs involving cognitive functions and awareness: Secondary | ICD-10-CM

## 2016-03-04 DIAGNOSIS — H5500 Unspecified nystagmus: Secondary | ICD-10-CM | POA: Diagnosis not present

## 2016-03-04 DIAGNOSIS — R292 Abnormal reflex: Secondary | ICD-10-CM

## 2016-03-04 DIAGNOSIS — G959 Disease of spinal cord, unspecified: Secondary | ICD-10-CM | POA: Diagnosis not present

## 2016-03-04 DIAGNOSIS — G992 Myelopathy in diseases classified elsewhere: Secondary | ICD-10-CM | POA: Diagnosis not present

## 2016-03-04 DIAGNOSIS — E119 Type 2 diabetes mellitus without complications: Secondary | ICD-10-CM | POA: Diagnosis not present

## 2016-03-04 DIAGNOSIS — R261 Paralytic gait: Secondary | ICD-10-CM | POA: Diagnosis not present

## 2016-03-04 DIAGNOSIS — R296 Repeated falls: Secondary | ICD-10-CM | POA: Diagnosis not present

## 2016-03-04 DIAGNOSIS — I69331 Monoplegia of upper limb following cerebral infarction affecting right dominant side: Secondary | ICD-10-CM | POA: Diagnosis not present

## 2016-03-04 DIAGNOSIS — M4802 Spinal stenosis, cervical region: Secondary | ICD-10-CM | POA: Diagnosis not present

## 2016-03-04 MED ORDER — GADOBENATE DIMEGLUMINE 529 MG/ML IV SOLN
12.0000 mL | Freq: Once | INTRAVENOUS | Status: AC | PRN
  Administered 2016-03-04: 12 mL via INTRAVENOUS
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
## 2016-03-07 NOTE — Telephone Encounter (Signed)
Note and reports faxed.  

## 2016-03-09 DIAGNOSIS — R261 Paralytic gait: Secondary | ICD-10-CM | POA: Diagnosis not present

## 2016-03-09 DIAGNOSIS — I69331 Monoplegia of upper limb following cerebral infarction affecting right dominant side: Secondary | ICD-10-CM | POA: Diagnosis not present

## 2016-03-09 DIAGNOSIS — R296 Repeated falls: Secondary | ICD-10-CM | POA: Diagnosis not present

## 2016-03-09 DIAGNOSIS — H5500 Unspecified nystagmus: Secondary | ICD-10-CM | POA: Diagnosis not present

## 2016-03-09 DIAGNOSIS — E119 Type 2 diabetes mellitus without complications: Secondary | ICD-10-CM | POA: Diagnosis not present

## 2016-03-09 DIAGNOSIS — G959 Disease of spinal cord, unspecified: Secondary | ICD-10-CM | POA: Diagnosis not present

## 2016-03-09 NOTE — Progress Notes (Signed)
Note faxed and confirmed that referral was received.

## 2016-03-10 ENCOUNTER — Telehealth: Payer: Self-pay | Admitting: Neurology

## 2016-03-10 NOTE — Telephone Encounter (Signed)
[name] [place] 09-02-2045. Her daughter called regarding information on her mother. She was needing to know about where Dr. Allena KatzPatel was referring her mother. Her # is [phone]. Thank you

## 2016-03-11 DIAGNOSIS — I69331 Monoplegia of upper limb following cerebral infarction affecting right dominant side: Secondary | ICD-10-CM | POA: Diagnosis not present

## 2016-03-11 DIAGNOSIS — G959 Disease of spinal cord, unspecified: Secondary | ICD-10-CM | POA: Diagnosis not present

## 2016-03-11 DIAGNOSIS — R261 Paralytic gait: Secondary | ICD-10-CM | POA: Diagnosis not present

## 2016-03-11 DIAGNOSIS — E119 Type 2 diabetes mellitus without complications: Secondary | ICD-10-CM | POA: Diagnosis not present

## 2016-03-11 DIAGNOSIS — H5500 Unspecified nystagmus: Secondary | ICD-10-CM | POA: Diagnosis not present

## 2016-03-11 DIAGNOSIS — R296 Repeated falls: Secondary | ICD-10-CM | POA: Diagnosis not present

## 2016-03-11 NOTE — Telephone Encounter (Signed)
Pt daughter is calling back to check the status of the message left on 03-10-16 please call [phone]

## 2016-03-11 NOTE — Telephone Encounter (Signed)
Patient's daughter has been notified by [place] from [facility] that patient's appointment is on Monday at 10:00.

## 2016-03-11 NOTE — Telephone Encounter (Signed)
Noted  

## 2016-03-14 DIAGNOSIS — M4712 Other spondylosis with myelopathy, cervical region: Secondary | ICD-10-CM | POA: Diagnosis not present

## 2016-03-16 ENCOUNTER — Ambulatory Visit (INDEPENDENT_AMBULATORY_CARE_PROVIDER_SITE_OTHER): Payer: Medicare Other | Admitting: Clinical

## 2016-03-16 ENCOUNTER — Encounter (HOSPITAL_COMMUNITY): Payer: Self-pay | Admitting: Clinical

## 2016-03-16 ENCOUNTER — Encounter (INDEPENDENT_AMBULATORY_CARE_PROVIDER_SITE_OTHER): Payer: Self-pay

## 2016-03-16 DIAGNOSIS — F203 Undifferentiated schizophrenia: Secondary | ICD-10-CM | POA: Diagnosis not present
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
## 2016-03-17 ENCOUNTER — Other Ambulatory Visit: Payer: Self-pay | Admitting: Neurosurgery

## 2016-03-25 ENCOUNTER — Encounter (HOSPITAL_COMMUNITY)
Admission: RE | Admit: 2016-03-25 | Discharge: 2016-03-25 | Disposition: A | Discharge status: Home / Self Care | Payer: Medicare Other | Source: Ambulatory Visit | Attending: Neurosurgery | Admitting: Neurosurgery

## 2016-03-25 ENCOUNTER — Telehealth: Payer: Self-pay | Admitting: Neurology

## 2016-03-25 ENCOUNTER — Encounter (HOSPITAL_COMMUNITY): Payer: Self-pay

## 2016-03-25 DIAGNOSIS — R001 Bradycardia, unspecified: Secondary | ICD-10-CM | POA: Insufficient documentation

## 2016-03-25 DIAGNOSIS — M4712 Other spondylosis with myelopathy, cervical region: Secondary | ICD-10-CM | POA: Diagnosis not present

## 2016-03-25 DIAGNOSIS — Z01818 Encounter for other preprocedural examination: Secondary | ICD-10-CM | POA: Diagnosis not present

## 2016-03-25 DIAGNOSIS — Z01812 Encounter for preprocedural laboratory examination: Secondary | ICD-10-CM | POA: Diagnosis not present

## 2016-03-25 DIAGNOSIS — E119 Type 2 diabetes mellitus without complications: Secondary | ICD-10-CM | POA: Insufficient documentation

## 2016-03-25 HISTORY — DX: Depression, unspecified: F32.A

## 2016-03-25 HISTORY — DX: Schizophrenia, unspecified: F20.9

## 2016-03-25 HISTORY — DX: Other complications of anesthesia, initial encounter: T88.59XA

## 2016-03-25 HISTORY — DX: Other amnesia: R41.3

## 2016-03-25 HISTORY — DX: Presence of spectacles and contact lenses: Z97.3

## 2016-03-25 HISTORY — DX: Major depressive disorder, single episode, unspecified: F32.9

## 2016-03-25 HISTORY — DX: Acute pancreatitis without necrosis or infection, unspecified: K85.90

## 2016-03-25 HISTORY — DX: Headache: R51

## 2016-03-25 HISTORY — DX: Type 2 diabetes mellitus with diabetic neuropathy, unspecified: E11.40

## 2016-03-25 HISTORY — DX: Presence of dental prosthetic device (complete) (partial): Z97.2

## 2016-03-25 HISTORY — DX: Chronic obstructive pulmonary disease, unspecified: J44.9

## 2016-03-25 HISTORY — DX: Adverse effect of unspecified anesthetic, initial encounter: T41.45XA

## 2016-03-25 HISTORY — DX: Headache, unspecified: R51.9

## 2016-03-25 HISTORY — DX: Other spondylosis with myelopathy, cervical region: M47.12

## 2016-03-25 HISTORY — DX: Anxiety disorder, unspecified: F41.9

## 2016-03-25 LAB — CBC
HEMATOCRIT: 39.6 % (ref 36.0–46.0)
Hemoglobin: 12.9 g/dL (ref 12.0–15.0)
MCH: 28.4 pg (ref 26.0–34.0)
MCHC: 32.6 g/dL (ref 30.0–36.0)
MCV: 87 fL (ref 78.0–100.0)
Platelets: 165 10*3/uL (ref 150–400)
RBC: 4.55 MIL/uL (ref 3.87–5.11)
RDW: 13.3 % (ref 11.5–15.5)
WBC: 8.7 10*3/uL (ref 4.0–10.5)

## 2016-03-25 LAB — BASIC METABOLIC PANEL
ANION GAP: 11 (ref 5–15)
BUN: 10 mg/dL (ref 6–20)
CHLORIDE: 108 mmol/L (ref 101–111)
CO2: 20 mmol/L — ABNORMAL LOW (ref 22–32)
Calcium: 10.5 mg/dL — ABNORMAL HIGH (ref 8.9–10.3)
Creatinine, Ser: 1.07 mg/dL — ABNORMAL HIGH (ref 0.44–1.00)
GFR calc non Af Amer: 52 mL/min — ABNORMAL LOW (ref >60)
GFR, EST AFRICAN AMERICAN: 60 mL/min — AB (ref >60)
Glucose, Bld: 137 mg/dL — ABNORMAL HIGH (ref 65–99)
POTASSIUM: 4 mmol/L (ref 3.5–5.1)
SODIUM: 139 mmol/L (ref 135–145)

## 2016-03-25 LAB — SURGICAL PCR SCREEN
MRSA, PCR: NEGATIVE
Staphylococcus aureus: NEGATIVE

## 2016-03-25 LAB — GLUCOSE, CAPILLARY: GLUCOSE-CAPILLARY: 122 mg/dL — AB (ref 65–99)

## 2016-03-25 NOTE — Telephone Encounter (Signed)
Patient daughter [place] Sinkrita  states that the patient needs a brace and [facility] are leaving [facility] now and would like to get brace. She state that dr patel ordered the brace but the order has not been sent to the place on [address] please call [phone]

## 2016-03-25 NOTE — Pre-Procedure Instructions (Signed)
Lurline DelWallace D Eaker  03/25/2016      [facility] [phone] - [place] - [address] [facility] [facility] [address] [place] [id number] Phone: [phone] Fax: [phone]  [facility] - [place], [place] - [address] [place] [id number] Phone: [phone] Fax: [phone]  [facility] [address] - [place], [place] - [id number] [place] [address] Phone: [phone] Fax: [phone]    Your procedure is scheduled on Friday, April 01, 2016  [facility].M.  Call this number if you have problems the morning of surgery:  [phone]   Remember:  Do not eat food or drink liquids after midnight Thursday, March 31, 2016  Take these medicines the morning of surgery with A SIP OF WATER : Duloxetine (Cymbalta), if needed: Meclizine ( Antivert ) for dizziness, Flonase nasal spray for allergies, Albuterol nebulizer treatment for wheezing or shortness of breath Stop taking Aspirin, vitamins and herbal medications. Do not take any NSAIDs ie: Ibuprofen, Advil, Naproxen, BC and Goody Powder or any medication containing Aspirin; stop now.     How to Manage Your Diabetes Before and After Surgery  Why is it important to control my blood sugar before and after surgery? . Improving blood sugar levels before and after surgery helps healing and can limit problems. . A way of improving blood sugar control is eating a healthy diet by: o  Eating less sugar and carbohydrates o  Increasing activity/exercise o  Talking with your doctor about reaching your blood sugar goals . High blood sugars (greater than 180 mg/dL) can raise your risk of infections and slow your recovery, so you will need to focus on controlling your diabetes during the weeks before surgery. . Make sure that the doctor who takes care of your diabetes  knows about your planned surgery including the date and location.  How do I manage my blood sugar before surgery? . Check your blood sugar at least 4 times a day, starting 2 days before surgery, to make sure that the level is not too high or low. o Check your blood sugar the morning of your surgery when you wake up and every 2 hours until you get to the Short Stay unit. . If your blood sugar is less than 70 mg/dL, you will need to treat for low blood sugar: o Do not take insulin. o Treat a low blood sugar (less than 70 mg/dL) with  cup of clear juice (cranberry or apple), 4 glucose tablets, OR glucose gel. o Recheck blood sugar in 15 minutes after treatment (to make sure it is greater than 70 mg/dL). If your blood sugar is not greater than 70 mg/dL on recheck, call [phone] for further instructions. . Report your blood sugar to the short stay nurse when you get to Short Stay.  . If you are admitted to the hospital after surgery: o Your blood sugar will be checked by the staff and you will probably be given insulin after surgery (instead of oral diabetes medicines) to make sure you have good blood sugar levels. o The goal for blood sugar control after surgery is 80-180 mg/dL.  WHAT DO I DO ABOUT MY DIABETES MEDICATION?   Marland Kitchen. Do not take oral diabetes medicines (pills) the morning of surgery such as Metformin ( Glucophage).   Patient Signature:  Date:   Nurse Signature:  Date:  Reviewed and Endorsed by [facility] Patient Education Committee, August 2015  Do not wear jewelry, make-up or nail polish.  Do not wear lotions, powders, or perfumes, or deoderant.  Do not shave 48 hours prior to surgery.    Do not bring valuables to the hospital.  [facility] is not responsible for any belongings or valuables.  Contacts, dentures or bridgework may not be worn into surgery.  Leave your suitcase in the car.  After surgery it may be brought to your room.  For patients admitted to the  hospital, discharge time will be determined by your treatment team.  Patients discharged the day of surgery will not be allowed to drive home.   Name and phone number of your driver:   Special instructions: Shower the night before surgery and the morning of surgery with CHG.  Please read over the following fact sheets that you were given. Pain Booklet, Coughing and Deep Breathing, MRSA Information and Surgical Site Infection Prevention

## 2016-03-25 NOTE — Telephone Encounter (Signed)
[place] SinkRita will call the ordering physician for neck brace order.

## 2016-03-25 NOTE — Progress Notes (Signed)
Pt accompanied by two daughters to PAT appointment. The one daughter, [place] SinkRita, did assist with pt assessment with pt consent. Pt denies SOB, chest pain, and being under the care of a cardiologist. Pt denies having a stress test, echo and cardiac cath. Pt denies having a chest x ray and EKG within the last year. Pt denies having labs within the last two weeks. Pt stated that she does not have a glucometer, therefore, she does not check her blood glucose. Spoke with Clair GullingNicki,, medical assistant at MD's office to clarify pre-op instructions regarding Aspirin. [facility], it is okay for pt to stop Aspirin now.

## 2016-03-26 LAB — HEMOGLOBIN A1C
HEMOGLOBIN A1C: 7.6 % — AB (ref 4.8–5.6)
Mean Plasma Glucose: 171 mg/dL

## 2016-03-31 MED ORDER — CEFAZOLIN SODIUM-DEXTROSE 2-4 GM/100ML-% IV SOLN
2.0000 g | INTRAVENOUS | Status: AC
  Administered 2016-04-01: 2 g via INTRAVENOUS
  Filled 2016-03-31: qty 100

## 2016-04-01 ENCOUNTER — Inpatient Hospital Stay (HOSPITAL_COMMUNITY): Payer: Medicare Other

## 2016-04-01 ENCOUNTER — Inpatient Hospital Stay (HOSPITAL_COMMUNITY)
Admission: RE | Admit: 2016-04-01 | Discharge: 2016-04-05 | DRG: 473 | Disposition: A | Discharge status: Skilled Nursing Facility | Payer: Medicare Other | Source: Ambulatory Visit | Attending: Neurosurgery | Admitting: Neurosurgery

## 2016-04-01 ENCOUNTER — Inpatient Hospital Stay (HOSPITAL_COMMUNITY): Payer: Medicare Other | Admitting: Anesthesiology

## 2016-04-01 ENCOUNTER — Encounter (HOSPITAL_COMMUNITY)
Admission: RE | Disposition: A | Discharge status: Skilled Nursing Facility | Payer: Self-pay | Source: Ambulatory Visit | Attending: Neurosurgery

## 2016-04-01 ENCOUNTER — Encounter (HOSPITAL_COMMUNITY): Payer: Self-pay | Admitting: Anesthesiology

## 2016-04-01 DIAGNOSIS — F209 Schizophrenia, unspecified: Secondary | ICD-10-CM | POA: Diagnosis present

## 2016-04-01 DIAGNOSIS — E1140 Type 2 diabetes mellitus with diabetic neuropathy, unspecified: Secondary | ICD-10-CM | POA: Diagnosis present

## 2016-04-01 DIAGNOSIS — Z7982 Long term (current) use of aspirin: Secondary | ICD-10-CM

## 2016-04-01 DIAGNOSIS — F329 Major depressive disorder, single episode, unspecified: Secondary | ICD-10-CM | POA: Diagnosis present

## 2016-04-01 DIAGNOSIS — F419 Anxiety disorder, unspecified: Secondary | ICD-10-CM | POA: Diagnosis present

## 2016-04-01 DIAGNOSIS — Z4789 Encounter for other orthopedic aftercare: Secondary | ICD-10-CM | POA: Diagnosis not present

## 2016-04-01 DIAGNOSIS — Z7984 Long term (current) use of oral hypoglycemic drugs: Secondary | ICD-10-CM | POA: Diagnosis not present

## 2016-04-01 DIAGNOSIS — Z981 Arthrodesis status: Secondary | ICD-10-CM | POA: Diagnosis not present

## 2016-04-01 DIAGNOSIS — R1310 Dysphagia, unspecified: Secondary | ICD-10-CM | POA: Diagnosis not present

## 2016-04-01 DIAGNOSIS — M6281 Muscle weakness (generalized): Secondary | ICD-10-CM | POA: Diagnosis not present

## 2016-04-01 DIAGNOSIS — Z87891 Personal history of nicotine dependence: Secondary | ICD-10-CM

## 2016-04-01 DIAGNOSIS — Z419 Encounter for procedure for purposes other than remedying health state, unspecified: Secondary | ICD-10-CM

## 2016-04-01 DIAGNOSIS — J449 Chronic obstructive pulmonary disease, unspecified: Secondary | ICD-10-CM | POA: Diagnosis present

## 2016-04-01 DIAGNOSIS — R2681 Unsteadiness on feet: Secondary | ICD-10-CM | POA: Diagnosis not present

## 2016-04-01 DIAGNOSIS — M4712 Other spondylosis with myelopathy, cervical region: Principal | ICD-10-CM | POA: Diagnosis present

## 2016-04-01 DIAGNOSIS — Z79899 Other long term (current) drug therapy: Secondary | ICD-10-CM

## 2016-04-01 DIAGNOSIS — Z8673 Personal history of transient ischemic attack (TIA), and cerebral infarction without residual deficits: Secondary | ICD-10-CM

## 2016-04-01 DIAGNOSIS — R1312 Dysphagia, oropharyngeal phase: Secondary | ICD-10-CM | POA: Diagnosis not present

## 2016-04-01 DIAGNOSIS — I10 Essential (primary) hypertension: Secondary | ICD-10-CM | POA: Diagnosis present

## 2016-04-01 DIAGNOSIS — M549 Dorsalgia, unspecified: Secondary | ICD-10-CM | POA: Diagnosis not present

## 2016-04-01 DIAGNOSIS — R262 Difficulty in walking, not elsewhere classified: Secondary | ICD-10-CM | POA: Diagnosis not present

## 2016-04-01 HISTORY — PX: ANTERIOR CERVICAL DECOMPRESSION/DISCECTOMY FUSION 4 LEVELS: SHX5556

## 2016-04-01 LAB — GLUCOSE, CAPILLARY
GLUCOSE-CAPILLARY: 207 mg/dL — AB (ref 65–99)
GLUCOSE-CAPILLARY: 283 mg/dL — AB (ref 65–99)
GLUCOSE-CAPILLARY: 224 mg/dL — AB (ref 65–99)
Glucose-Capillary: 207 mg/dL — ABNORMAL HIGH (ref 65–99)
Glucose-Capillary: 230 mg/dL — ABNORMAL HIGH (ref 65–99)

## 2016-04-01 SURGERY — ANTERIOR CERVICAL DECOMPRESSION/DISCECTOMY FUSION 4 LEVELS
Anesthesia: General

## 2016-04-01 MED ORDER — DULOXETINE HCL 60 MG PO CPEP
60.0000 mg | ORAL_CAPSULE | Freq: Every day | ORAL | Status: DC
  Administered 2016-04-02 – 2016-04-05 (×4): 60 mg via ORAL
  Filled 2016-04-01 (×5): qty 1

## 2016-04-01 MED ORDER — METHOCARBAMOL 500 MG PO TABS
500.0000 mg | ORAL_TABLET | Freq: Four times a day (QID) | ORAL | Status: DC | PRN
  Administered 2016-04-02 – 2016-04-05 (×6): 500 mg via ORAL
  Filled 2016-04-01 (×7): qty 1

## 2016-04-01 MED ORDER — HYDROMORPHONE HCL 1 MG/ML IJ SOLN
0.2500 mg | INTRAMUSCULAR | Status: DC | PRN
  Administered 2016-04-01 (×2): 0.5 mg via INTRAVENOUS

## 2016-04-01 MED ORDER — INSULIN ASPART 100 UNIT/ML ~~LOC~~ SOLN
SUBCUTANEOUS | Status: AC
  Filled 2016-04-01: qty 1

## 2016-04-01 MED ORDER — FLUTICASONE PROPIONATE 50 MCG/ACT NA SUSP
1.0000 | Freq: Every day | NASAL | Status: DC | PRN
  Filled 2016-04-01: qty 16

## 2016-04-01 MED ORDER — ONDANSETRON HCL 4 MG/2ML IJ SOLN
INTRAMUSCULAR | Status: DC | PRN
  Administered 2016-04-01 (×2): 4 mg via INTRAVENOUS

## 2016-04-01 MED ORDER — SODIUM CHLORIDE 0.9 % IR SOLN
Status: DC | PRN
  Administered 2016-04-01: 09:00:00

## 2016-04-01 MED ORDER — HYDROMORPHONE HCL 1 MG/ML IJ SOLN
0.5000 mg | INTRAMUSCULAR | Status: DC | PRN
  Administered 2016-04-01 – 2016-04-03 (×9): 1 mg via INTRAVENOUS
  Administered 2016-04-04: 0.5 mg via INTRAVENOUS
  Filled 2016-04-01 (×10): qty 1

## 2016-04-01 MED ORDER — SENNA 8.6 MG PO TABS
1.0000 | ORAL_TABLET | Freq: Two times a day (BID) | ORAL | Status: DC
  Administered 2016-04-02 – 2016-04-05 (×6): 8.6 mg via ORAL
  Filled 2016-04-01 (×8): qty 1

## 2016-04-01 MED ORDER — PROPOFOL 10 MG/ML IV BOLUS
INTRAVENOUS | Status: DC | PRN
  Administered 2016-04-01: 120 mg via INTRAVENOUS

## 2016-04-01 MED ORDER — SODIUM CHLORIDE 0.9 % IV SOLN
INTRAVENOUS | Status: DC
  Administered 2016-04-01: 20:00:00 via INTRAVENOUS
  Administered 2016-04-04: 980 mL via INTRAVENOUS

## 2016-04-01 MED ORDER — PROMETHAZINE HCL 25 MG/ML IJ SOLN: 6.2500 mg | INTRAMUSCULAR | Status: DC | PRN

## 2016-04-01 MED ORDER — HYDRALAZINE HCL 20 MG/ML IJ SOLN
INTRAMUSCULAR | Status: DC | PRN
  Administered 2016-04-01 (×2): 5 mg via INTRAVENOUS

## 2016-04-01 MED ORDER — ALBUTEROL SULFATE (2.5 MG/3ML) 0.083% IN NEBU: 2.5000 mg | INHALATION_SOLUTION | Freq: Four times a day (QID) | RESPIRATORY_TRACT | Status: DC | PRN

## 2016-04-01 MED ORDER — MENTHOL 3 MG MT LOZG: 1.0000 | LOZENGE | OROMUCOSAL | Status: DC | PRN

## 2016-04-01 MED ORDER — MIDAZOLAM HCL 5 MG/5ML IJ SOLN
INTRAMUSCULAR | Status: DC | PRN
Start: 2016-04-01 — End: 2016-04-01
  Administered 2016-04-01: 2 mg via INTRAVENOUS

## 2016-04-01 MED ORDER — ONDANSETRON HCL 4 MG/2ML IJ SOLN: 4.0000 mg | INTRAMUSCULAR | Status: DC | PRN

## 2016-04-01 MED ORDER — BUPIVACAINE HCL 0.5 % IJ SOLN
INTRAMUSCULAR | Status: DC | PRN
  Administered 2016-04-01: 7.5 mL

## 2016-04-01 MED ORDER — LIDOCAINE HCL (CARDIAC) 20 MG/ML IV SOLN
INTRAVENOUS | Status: DC | PRN
  Administered 2016-04-01: 60 mg via INTRAVENOUS

## 2016-04-01 MED ORDER — ROCURONIUM BROMIDE 100 MG/10ML IV SOLN
INTRAVENOUS | Status: DC | PRN
  Administered 2016-04-01: 10 mg via INTRAVENOUS
  Administered 2016-04-01: 50 mg via INTRAVENOUS
  Administered 2016-04-01: 10 mg via INTRAVENOUS

## 2016-04-01 MED ORDER — CEFAZOLIN SODIUM-DEXTROSE 2-4 GM/100ML-% IV SOLN
2.0000 g | Freq: Three times a day (TID) | INTRAVENOUS | Status: AC
  Administered 2016-04-01 – 2016-04-02 (×2): 2 g via INTRAVENOUS
  Filled 2016-04-01 (×2): qty 100

## 2016-04-01 MED ORDER — LIDOCAINE-EPINEPHRINE 1 %-1:100000 IJ SOLN
INTRAMUSCULAR | Status: DC | PRN
  Administered 2016-04-01: 7.5 mL

## 2016-04-01 MED ORDER — SUGAMMADEX SODIUM 200 MG/2ML IV SOLN
INTRAVENOUS | Status: DC | PRN
  Administered 2016-04-01: 118.4 mg via INTRAVENOUS

## 2016-04-01 MED ORDER — ACETAMINOPHEN 650 MG RE SUPP: 650.0000 mg | RECTAL | Status: DC | PRN

## 2016-04-01 MED ORDER — CYANOCOBALAMIN 1000 MCG/ML IJ SOLN: 1000.0000 ug | INTRAMUSCULAR | Status: DC

## 2016-04-01 MED ORDER — MECLIZINE HCL 12.5 MG PO TABS: 25.0000 mg | ORAL_TABLET | Freq: Three times a day (TID) | ORAL | Status: DC | PRN

## 2016-04-01 MED ORDER — AMLODIPINE BESY-BENAZEPRIL HCL 5-10 MG PO CAPS: 1.0000 | ORAL_CAPSULE | Freq: Every day | ORAL | Status: DC

## 2016-04-01 MED ORDER — INSULIN ASPART 100 UNIT/ML ~~LOC~~ SOLN
10.0000 [IU] | Freq: Once | SUBCUTANEOUS | Status: AC
  Administered 2016-04-01: 10 [IU] via SUBCUTANEOUS

## 2016-04-01 MED ORDER — OXYCODONE-ACETAMINOPHEN 5-325 MG PO TABS
1.0000 | ORAL_TABLET | ORAL | Status: DC | PRN
  Administered 2016-04-02 (×2): 2 via ORAL
  Administered 2016-04-04 (×2): 1 via ORAL
  Administered 2016-04-04: 2 via ORAL
  Administered 2016-04-05 (×2): 1 via ORAL
  Filled 2016-04-01 (×3): qty 1
  Filled 2016-04-01 (×2): qty 2
  Filled 2016-04-01: qty 1
  Filled 2016-04-01: qty 2

## 2016-04-01 MED ORDER — PROPOFOL 10 MG/ML IV BOLUS
INTRAVENOUS | Status: AC
  Filled 2016-04-01: qty 20

## 2016-04-01 MED ORDER — HYDRALAZINE HCL 20 MG/ML IJ SOLN
5.0000 mg | INTRAMUSCULAR | Status: DC | PRN
  Administered 2016-04-01: 5 mg via INTRAVENOUS
  Administered 2016-04-02: 10 mg via INTRAVENOUS
  Filled 2016-04-01 (×2): qty 1

## 2016-04-01 MED ORDER — AMLODIPINE BESYLATE 5 MG PO TABS
5.0000 mg | ORAL_TABLET | Freq: Every day | ORAL | Status: DC
  Administered 2016-04-02 – 2016-04-04 (×3): 5 mg via ORAL
  Filled 2016-04-01 (×3): qty 1

## 2016-04-01 MED ORDER — ROSUVASTATIN CALCIUM 20 MG PO TABS
20.0000 mg | ORAL_TABLET | Freq: Every day | ORAL | Status: DC
  Administered 2016-04-02 – 2016-04-05 (×4): 20 mg via ORAL
  Filled 2016-04-01 (×5): qty 1

## 2016-04-01 MED ORDER — FENTANYL CITRATE (PF) 100 MCG/2ML IJ SOLN
INTRAMUSCULAR | Status: DC | PRN
  Administered 2016-04-01 (×2): 100 ug via INTRAVENOUS
  Administered 2016-04-01 (×2): 50 ug via INTRAVENOUS
  Administered 2016-04-01 (×2): 25 ug via INTRAVENOUS
  Administered 2016-04-01 (×2): 50 ug via INTRAVENOUS

## 2016-04-01 MED ORDER — 0.9 % SODIUM CHLORIDE (POUR BTL) OPTIME
TOPICAL | Status: DC | PRN
  Administered 2016-04-01: 1000 mL

## 2016-04-01 MED ORDER — SODIUM CHLORIDE 0.9% FLUSH: 3.0000 mL | INTRAVENOUS | Status: DC | PRN

## 2016-04-01 MED ORDER — METHOCARBAMOL 1000 MG/10ML IJ SOLN
500.0000 mg | Freq: Four times a day (QID) | INTRAVENOUS | Status: DC | PRN
  Administered 2016-04-02: 500 mg via INTRAVENOUS
  Filled 2016-04-01 (×3): qty 5

## 2016-04-01 MED ORDER — FENTANYL CITRATE (PF) 100 MCG/2ML IJ SOLN
INTRAMUSCULAR | Status: AC
  Filled 2016-04-01: qty 2

## 2016-04-01 MED ORDER — METFORMIN HCL 500 MG PO TABS
500.0000 mg | ORAL_TABLET | Freq: Two times a day (BID) | ORAL | Status: DC
  Administered 2016-04-02 – 2016-04-05 (×7): 500 mg via ORAL
  Filled 2016-04-01 (×7): qty 1

## 2016-04-01 MED ORDER — BISACODYL 10 MG RE SUPP: 10.0000 mg | Freq: Every day | RECTAL | Status: DC | PRN

## 2016-04-01 MED ORDER — ONDANSETRON HCL 4 MG/2ML IJ SOLN
INTRAMUSCULAR | Status: AC
  Filled 2016-04-01: qty 2

## 2016-04-01 MED ORDER — TRAZODONE HCL 50 MG PO TABS
150.0000 mg | ORAL_TABLET | Freq: Every day | ORAL | Status: DC
  Administered 2016-04-02 – 2016-04-04 (×3): 150 mg via ORAL
  Filled 2016-04-01 (×4): qty 1

## 2016-04-01 MED ORDER — THROMBIN 5000 UNITS EX SOLR
OROMUCOSAL | Status: DC | PRN
  Administered 2016-04-01 (×2): via TOPICAL

## 2016-04-01 MED ORDER — INSULIN ASPART 100 UNIT/ML ~~LOC~~ SOLN
0.0000 [IU] | SUBCUTANEOUS | Status: DC
  Administered 2016-04-02: 2 [IU] via SUBCUTANEOUS
  Administered 2016-04-02: 1 [IU] via SUBCUTANEOUS
  Administered 2016-04-02: 2 [IU] via SUBCUTANEOUS
  Administered 2016-04-02: 5 [IU] via SUBCUTANEOUS
  Administered 2016-04-02: 2 [IU] via SUBCUTANEOUS
  Administered 2016-04-02: 3 [IU] via SUBCUTANEOUS
  Administered 2016-04-03 (×3): 2 [IU] via SUBCUTANEOUS
  Administered 2016-04-03: 1 [IU] via SUBCUTANEOUS
  Administered 2016-04-03: 3 [IU] via SUBCUTANEOUS
  Administered 2016-04-04 (×2): 1 [IU] via SUBCUTANEOUS
  Administered 2016-04-04 (×2): 2 [IU] via SUBCUTANEOUS
  Administered 2016-04-04: 1 [IU] via SUBCUTANEOUS
  Administered 2016-04-04: 2 [IU] via SUBCUTANEOUS
  Administered 2016-04-05 (×2): 1 [IU] via SUBCUTANEOUS
  Administered 2016-04-05: 2 [IU] via SUBCUTANEOUS

## 2016-04-01 MED ORDER — DOCUSATE SODIUM 100 MG PO CAPS
100.0000 mg | ORAL_CAPSULE | Freq: Two times a day (BID) | ORAL | Status: DC
  Administered 2016-04-02 – 2016-04-05 (×5): 100 mg via ORAL
  Filled 2016-04-01 (×7): qty 1

## 2016-04-01 MED ORDER — PANTOPRAZOLE SODIUM 20 MG PO TBEC
20.0000 mg | DELAYED_RELEASE_TABLET | Freq: Every day | ORAL | Status: DC
  Administered 2016-04-02 – 2016-04-05 (×4): 20 mg via ORAL
  Filled 2016-04-01 (×6): qty 1

## 2016-04-01 MED ORDER — PHENOL 1.4 % MT LIQD
1.0000 | OROMUCOSAL | Status: DC | PRN
Start: 2016-04-01 — End: 2016-04-05
  Administered 2016-04-03: 1 via OROMUCOSAL
  Filled 2016-04-01: qty 177

## 2016-04-01 MED ORDER — POLYETHYLENE GLYCOL 3350 17 G PO PACK: 17.0000 g | PACK | Freq: Every day | ORAL | Status: DC | PRN

## 2016-04-01 MED ORDER — LINACLOTIDE 145 MCG PO CAPS
145.0000 ug | ORAL_CAPSULE | Freq: Every day | ORAL | Status: DC
  Administered 2016-04-02 – 2016-04-05 (×3): 145 ug via ORAL
  Filled 2016-04-01 (×5): qty 1

## 2016-04-01 MED ORDER — PHENYLEPHRINE HCL 10 MG/ML IJ SOLN
INTRAVENOUS | Status: DC | PRN
  Administered 2016-04-01: 25 ug/min via INTRAVENOUS

## 2016-04-01 MED ORDER — FENTANYL CITRATE (PF) 100 MCG/2ML IJ SOLN
INTRAMUSCULAR | Status: AC
  Filled 2016-04-01: qty 4

## 2016-04-01 MED ORDER — THROMBIN 20000 UNITS EX SOLR
CUTANEOUS | Status: DC | PRN
  Administered 2016-04-01: 09:00:00 via TOPICAL

## 2016-04-01 MED ORDER — TIZANIDINE HCL 4 MG PO TABS
4.0000 mg | ORAL_TABLET | Freq: Every day | ORAL | Status: DC
  Administered 2016-04-02 – 2016-04-04 (×3): 4 mg via ORAL
  Filled 2016-04-01 (×5): qty 1

## 2016-04-01 MED ORDER — ZIPRASIDONE HCL 80 MG PO CAPS
80.0000 mg | ORAL_CAPSULE | Freq: Every day | ORAL | Status: DC
  Administered 2016-04-02 – 2016-04-04 (×3): 80 mg via ORAL
  Filled 2016-04-01 (×5): qty 1

## 2016-04-01 MED ORDER — ACETAMINOPHEN 325 MG PO TABS: 650.0000 mg | ORAL_TABLET | ORAL | Status: DC | PRN

## 2016-04-01 MED ORDER — DEXAMETHASONE SODIUM PHOSPHATE 10 MG/ML IJ SOLN
INTRAMUSCULAR | Status: DC | PRN
  Administered 2016-04-01: 10 mg via INTRAVENOUS

## 2016-04-01 MED ORDER — MIDAZOLAM HCL 2 MG/2ML IJ SOLN
INTRAMUSCULAR | Status: AC
  Filled 2016-04-01: qty 2

## 2016-04-01 MED ORDER — DEXAMETHASONE SODIUM PHOSPHATE 10 MG/ML IJ SOLN
INTRAMUSCULAR | Status: AC
  Filled 2016-04-01: qty 1

## 2016-04-01 MED ORDER — HYDROMORPHONE HCL 1 MG/ML IJ SOLN
INTRAMUSCULAR | Status: AC
  Filled 2016-04-01: qty 1

## 2016-04-01 MED ORDER — SODIUM CHLORIDE 0.9 % IV SOLN: 250.0000 mL | INTRAVENOUS | Status: DC

## 2016-04-01 MED ORDER — BENAZEPRIL HCL 20 MG PO TABS
10.0000 mg | ORAL_TABLET | Freq: Every day | ORAL | Status: DC
  Administered 2016-04-02 – 2016-04-04 (×3): 10 mg via ORAL
  Filled 2016-04-01 (×3): qty 1

## 2016-04-01 MED ORDER — SODIUM CHLORIDE 0.9% FLUSH
3.0000 mL | Freq: Two times a day (BID) | INTRAVENOUS | Status: DC
  Administered 2016-04-01 – 2016-04-05 (×7): 3 mL via INTRAVENOUS

## 2016-04-01 MED ORDER — LACTATED RINGERS IV SOLN
INTRAVENOUS | Status: DC | PRN
  Administered 2016-04-01 (×2): via INTRAVENOUS

## 2016-04-01 SURGICAL SUPPLY — 78 items
BAG DECANTER FOR FLEXI CONT (MISCELLANEOUS) ×3 IMPLANT
BENZOIN TINCTURE PRP APPL 2/3 (GAUZE/BANDAGES/DRESSINGS) ×3 IMPLANT
BLADE CLIPPER SURG (BLADE) IMPLANT
BLADE SURG 11 STRL SS (BLADE) ×3 IMPLANT
BLADE ULTRA TIP 2M (BLADE) IMPLANT
BUR MATCHSTICK NEURO 3.0 LAGG (BURR) ×6 IMPLANT
BUR PRECISION FLUTE 5.0 (BURR) ×3 IMPLANT
CAGE EXPANSE 8X6X6 (Cage) ×6 IMPLANT
CAGE PEEK 5X14X11 (Cage) ×3 IMPLANT
CAGE PEEK 6X14X11 (Cage) ×8 IMPLANT
CANISTER SUCT 3000ML PPV (MISCELLANEOUS) ×3 IMPLANT
CLOSURE WOUND 1/2 X4 (GAUZE/BANDAGES/DRESSINGS) ×1
DECANTER SPIKE VIAL GLASS SM (MISCELLANEOUS) ×3 IMPLANT
DERMABOND ADVANCED (GAUZE/BANDAGES/DRESSINGS) ×2
DERMABOND ADVANCED .7 DNX12 (GAUZE/BANDAGES/DRESSINGS) ×1 IMPLANT
DRAIN CHANNEL 10M FLAT 3/4 FLT (DRAIN) IMPLANT
DRAPE C-ARM 42X72 X-RAY (DRAPES) ×6 IMPLANT
DRAPE HALF SHEET 40X57 (DRAPES) IMPLANT
DRAPE LAPAROTOMY 100X72 PEDS (DRAPES) ×3 IMPLANT
DRAPE MICROSCOPE LEICA (MISCELLANEOUS) ×3 IMPLANT
DRAPE POUCH INSTRU U-SHP 10X18 (DRAPES) ×3 IMPLANT
DRSG OPSITE POSTOP 3X4 (GAUZE/BANDAGES/DRESSINGS) IMPLANT
DRSG OPSITE POSTOP 4X6 (GAUZE/BANDAGES/DRESSINGS) ×3 IMPLANT
DURAPREP 6ML APPLICATOR 50/CS (WOUND CARE) ×3 IMPLANT
ELECT COATED BLADE 2.86 ST (ELECTRODE) ×3 IMPLANT
ELECT REM PT RETURN 9FT ADLT (ELECTROSURGICAL) ×3
ELECTRODE REM PT RTRN 9FT ADLT (ELECTROSURGICAL) ×1 IMPLANT
EVACUATOR SILICONE 100CC (DRAIN) IMPLANT
GAUZE SPONGE 4X4 16PLY XRAY LF (GAUZE/BANDAGES/DRESSINGS) ×3 IMPLANT
GLOVE BIO SURGEON STRL SZ8 (GLOVE) ×3 IMPLANT
GLOVE BIOGEL PI IND STRL 7.0 (GLOVE) ×1 IMPLANT
GLOVE BIOGEL PI IND STRL 7.5 (GLOVE) ×2 IMPLANT
GLOVE BIOGEL PI IND STRL 8.5 (GLOVE) ×1 IMPLANT
GLOVE BIOGEL PI INDICATOR 7.0 (GLOVE) ×2
GLOVE BIOGEL PI INDICATOR 7.5 (GLOVE) ×4
GLOVE BIOGEL PI INDICATOR 8.5 (GLOVE) ×2
GLOVE ECLIPSE 7.0 STRL STRAW (GLOVE) ×6 IMPLANT
GLOVE EXAM NITRILE LRG STRL (GLOVE) IMPLANT
GLOVE EXAM NITRILE XL STR (GLOVE) IMPLANT
GLOVE EXAM NITRILE XS STR PU (GLOVE) IMPLANT
GOWN STRL REUS W/ TWL LRG LVL3 (GOWN DISPOSABLE) ×2 IMPLANT
GOWN STRL REUS W/ TWL XL LVL3 (GOWN DISPOSABLE) IMPLANT
GOWN STRL REUS W/TWL 2XL LVL3 (GOWN DISPOSABLE) IMPLANT
GOWN STRL REUS W/TWL LRG LVL3 (GOWN DISPOSABLE) ×4
GOWN STRL REUS W/TWL XL LVL3 (GOWN DISPOSABLE)
HEMOSTAT POWDER KIT SURGIFOAM (HEMOSTASIS) ×6 IMPLANT
KIT BASIN OR (CUSTOM PROCEDURE TRAY) ×3 IMPLANT
KIT ROOM TURNOVER OR (KITS) ×3 IMPLANT
NEEDLE HYPO 25X1 1.5 SAFETY (NEEDLE) ×3 IMPLANT
NEEDLE SPNL 22GX3.5 QUINCKE BK (NEEDLE) ×3 IMPLANT
NS IRRIG 1000ML POUR BTL (IV SOLUTION) ×3 IMPLANT
PACK LAMINECTOMY NEURO (CUSTOM PROCEDURE TRAY) ×3 IMPLANT
PAD ARMBOARD 7.5X6 YLW CONV (MISCELLANEOUS) ×9 IMPLANT
PATTIES SURGICAL 1X1 (DISPOSABLE) ×3 IMPLANT
PEEK ANATOMIC STRUT 5X14X11MM (Peek) ×3 IMPLANT
PIN DISTRACTION 14MM (PIN) ×6 IMPLANT
PLATE 4 77.5XLCK NS SPNE CVD (Plate) ×1 IMPLANT
PLATE 4 ATLANTIS TRANS (Plate) ×2 IMPLANT
PUTTY DBX 1CC (Putty) ×3 IMPLANT
PUTTY DBX 1CC DEPUY (Putty) ×1 IMPLANT
RUBBERBAND STERILE (MISCELLANEOUS) ×6 IMPLANT
SCREW 4.0X13 (Screw) ×6 IMPLANT
SCREW BN 13X4XSLF DRL FXANG (Screw) ×3 IMPLANT
SCREW RESCUE 13MM (Screw) ×3 IMPLANT
SCREW SELF TAP VAR 4.0X13 (Screw) ×12 IMPLANT
SPACER SPNL 11X14X6XPEEK CVD (Cage) ×1 IMPLANT
SPCR SPNL 11X14X6XPEEK CVD (Cage) ×1 IMPLANT
SPONGE INTESTINAL PEANUT (DISPOSABLE) ×3 IMPLANT
SPONGE SURGIFOAM ABS GEL 100 (HEMOSTASIS) ×3 IMPLANT
STRIP CLOSURE SKIN 1/2X4 (GAUZE/BANDAGES/DRESSINGS) ×2 IMPLANT
SUT ETHILON 3 0 FSL (SUTURE) IMPLANT
SUT VIC AB 0 CT1 27 (SUTURE) ×2
SUT VIC AB 0 CT1 27XBRD ANBCTR (SUTURE) ×1 IMPLANT
SUT VIC AB 3-0 SH 8-18 (SUTURE) ×3 IMPLANT
SUT VICRYL 3-0 RB1 18 ABS (SUTURE) ×6 IMPLANT
TOWEL OR 17X24 6PK STRL BLUE (TOWEL DISPOSABLE) ×3 IMPLANT
TOWEL OR 17X26 10 PK STRL BLUE (TOWEL DISPOSABLE) ×3 IMPLANT
WATER STERILE IRR 1000ML POUR (IV SOLUTION) ×3 IMPLANT

## 2016-04-01 NOTE — Progress Notes (Signed)
Pt arrived to the unit from PACU; alert and verbally responsive. Pt IV intact and transfusing, foley intact and unclamped; pt has cervical collar on and aligned; pt oriented to the unit and room; fall precaution and prevention education completed with pt and family. Pt VSS; neck honeycomb dsg remains clean, dry and intact; MAE x4; pt report difficulty swallowing; pt speech slurred as pt report it as residual from previous stroke; pt noted to cough when given sips of water. Pt made NPO and advised not to eat until speech comes in to evaluate pt. Will continue to closely monitor pt. Dionne BucyP Amo Temeca Somma RN

## 2016-04-01 NOTE — H&P (Signed)
CC:  Difficulty walking  HPI: [name] is a 70 year old woman I'm seeing for gait instability. Her history begins about 3 years ago, when they started to notice difficulty walking. Her symptoms slowly but progressively worsened. Initially, she did not require any assistive device, but now is only able to walk safely with a Rollator walker. She does not really complain of any significant pain symptoms, neither in the neck, arms, or lower back. Upon questioning, she says that she has also noted some weakness in her hands, as well as a significant change in her handwriting, and sometimes she has a hard time putting on a shirt with buttons. She does not describe any incontinence, or any retention, however she does relate a vague history of urinary frequency. She does have a history of hypertension, diabetes, as well as a stroke about 3 or 4 years ago at which time she had difficulty speaking. They say she recovered very well from the stroke, prior to the onset of the symptoms above  PMH: Past Medical History:  Diagnosis Date  . Anxiety   . Cervical spondylosis with myelopathy   . Complication of anesthesia    hard time waking up with last procedure years ago  . COPD (chronic obstructive pulmonary disease) ([place])   . Depression   . Diabetes mellitus without complication ([place])   . Diabetic neuropathy ([place])   . Headache   . Hypertension   . Memory deficit   . Pancreatitis   . Schizophrenia ([place])   . Stroke ([place])   . Wears dentures   . Wears glasses     PSH: Past Surgical History:  Procedure Laterality Date  . FOOT SURGERY    . MULTIPLE TOOTH EXTRACTIONS    . RHINOPLASTY    . ROTATOR CUFF REPAIR    . TONSILLECTOMY    . TUBAL LIGATION      SH: Social History  Substance Use Topics  . Smoking status: Former Smoker    Types: Cigarettes  . Smokeless tobacco: Never Used     Comment: in 2013  . Alcohol use No    MEDS: Prior to Admission medications   Medication Sig Start Date  End Date Taking? Authorizing Provider  amLODipine-benazepril (LOTREL) 5-10 MG per capsule Take 1 capsule by mouth at bedtime.    Yes Historical Provider, MD  aspirin EC 81 MG tablet Take 81 mg by mouth daily.   Yes Historical Provider, MD  Cyanocobalamin (B-12) 1000 MCG/ML KIT Inject 1,000 mcg as directed every 30 (thirty) days.   Yes Historical Provider, MD  DULoxetine (CYMBALTA) 60 MG capsule Take 60 mg by mouth daily.   Yes Historical Provider, MD  fluticasone (FLONASE) 50 MCG/ACT nasal spray Place 1 spray into both nostrils daily as needed for allergies.    Yes Historical Provider, MD  Linaclotide Rolan Lipa) 145 MCG CAPS capsule Take 145 mcg by mouth daily.   Yes Historical Provider, MD  meclizine (ANTIVERT) 25 MG tablet Take 25 mg by mouth 3 (three) times daily as needed for dizziness.   Yes Historical Provider, MD  metFORMIN (GLUCOPHAGE) 500 MG tablet Take 500 mg by mouth 2 (two) times daily with a meal.   Yes Historical Provider, MD  rosuvastatin (CRESTOR) 20 MG tablet Take 20 mg by mouth daily.   Yes Historical Provider, MD  tiZANidine (ZANAFLEX) 4 MG tablet Take 4 mg by mouth at bedtime.    Yes Historical Provider, MD  traZODone (DESYREL) 150 MG tablet Take 150 mg by mouth at  bedtime.   Yes Historical Provider, MD  ziprasidone (GEODON) 80 MG capsule Take 80 mg by mouth at bedtime.    Yes Historical Provider, MD  albuterol (PROVENTIL) (2.5 MG/3ML) 0.083% nebulizer solution Take 2.5 mg by nebulization every 6 (six) hours as needed for wheezing or shortness of breath.    Historical Provider, MD    ALLERGY: Allergies  Allergen Reactions  . No Known Allergies     ROS: ROS  NEUROLOGIC EXAM: Awake, alert, oriented Memory and concentration grossly intact Speech fluent, appropriate CN grossly intact Motor exam: Upper Extremities Deltoid Bicep Tricep Grip  Right 5/5 5/5 4/5 4/5  Left 5/5 5/5 4/5 4/5   Lower Extremity IP Quad PF DF EHL  Right 5/5 5/5 5/5 5/5 5/5  Left 5/5 5/5 5/5  5/5 5/5   Sensation grossly intact to LT Diffusely increased DTR (+) Hoffman's bilaterally (+) Pectoralis bilaterally  IMGAING: MRI of the cervical spine dated 03/04/16 was reviewed. There is straightening of the normal cervical lordosis. Primary finding is at C3 C4, C4 C5, C5 C6, and C6 C7 where there is fairly central broad-based disc bulge with resultant moderate to severe spinal cord compression. Worst is at C3 C4 and C5 C6. At the C5 C6 level, there is thinning of the spinal cord with T2 signal change.  IMPRESSION: 70 year old woman with chronic cervical spondylosis with myelopathy, related to 4 level disease from C3 to C7. Given the severity of her symptoms and radiographic compression, she will need decompression.  PLAN: ACDF C3 C4, C4 C5, C5 C6, C6 C7  I did review the MRI images and findings with the patient and her family in the office.  With her symptoms and the degree of compression, I did recommend surgical decompression and fusion. The risks of surgery were discussed in detail with the patient and daughters which include but are not limited to spinal cord injury which may result in hand, leg, and bowel dysfunction, postoperative dysphagia, dysphonia, neck hematoma, or subsequent surgery for hematoma. The risk of CSF leak was also discussed. In addition, I explained to him that after spinal fusion surgery, there is the possibility of requiring future surgical intervention. The patient understood our discussion as well as the risks of the surgery and is willing to proceed. All questions were answered.

## 2016-04-01 NOTE — Progress Notes (Signed)
MD paged x2 concerning pt swallowing difficulty and painful swallowing; awaiting on return call back from MD. Pt po medications held until speech eval or MD address. Will continue to closely monitor pt. Dionne BucyP. Amo Snow Peoples RN

## 2016-04-01 NOTE — Anesthesia Postprocedure Evaluation (Signed)
Anesthesia Post Note  Patient: [name]  Procedure(s) Performed: Procedure(s) (LRB): ANTERIOR CERVICAL DECOMPRESSION/DISCECTOMY FUSION CERVICAL THREE- CERVICAL FOUR, CERVICAL FOUR-CERVICAL FIVE, CORPECTOMY C6 (N/A)  Patient location during evaluation: PACU Anesthesia Type: General Level of consciousness: awake and alert Pain management: pain level controlled Vital Signs Assessment: post-procedure vital signs reviewed and stable Respiratory status: spontaneous breathing, nonlabored ventilation, respiratory function stable and patient connected to nasal cannula oxygen Cardiovascular status: blood pressure returned to baseline and stable Postop Assessment: no signs of nausea or vomiting Anesthetic complications: no    Last Vitals:  Vitals:   04/01/16 0632 04/01/16 1254  BP: (!) 159/90 (!) 151/74  Pulse: (!) 53 83  Resp: 18 (!) 26  Temp: 37.1 C 36.6 C    Last Pain:  Vitals:   04/01/16 0632  TempSrc: Oral                 Keston Seever S

## 2016-04-01 NOTE — Op Note (Signed)
PREOP DIAGNOSIS: Cervical Spondylosis with myelopathy, C3-4, C4-5, C5-6, C6-7  POSTOP DIAGNOSIS: Same  PROCEDURE: 1. Discectomy at C3-4, C4-5 for decompression of spinal cord and exiting nerve roots  2. Corpectomy, C6 for decompression of spinal cord and exiting nerve roots  2. Placement of intervertebral biomechanical device, Medtronic PTC PEEK 562m lordotic @ C3-4, C4-5, 166mstatic PEEK C5-C7 3. Placement of anterior instrumentation consisting of interbody plate and screws spanning C3-C7, Medtronic Atlantis translational 77.62m362m. Use of morselized bone allograft  5. Arthrodesis C3-4, C4-5, C5-7, anterior interbody technique  6. Use of intraoperative microscope  SURGEON: Dr. NeeConsuella LoseD  ASSISTANT: Dr. JosErline LevineD  ANESTHESIA: General Endotracheal  EBL: 225cc  SPECIMENS: None  DRAINS: None  COMPLICATIONS: None immediate  CONDITION: Hemodynamically stable to PACU  HISTORY: [name] a 69 47o. woman presenting to the office with progressive weakness and difficulty walking. MRI demonstrated severe spinal cord compression at C3-C7. Treatment options were discussed, and she elected to proceed with surgical decompression and fusion. The preoperative plan was for C3-4, C4-5, C5-6, C6-7 ACDF. Risks and benefits of the surgery were reviewed in detail. AFter all questions were answered, consent was obtained.  PROCEDURE IN DETAIL: The patient was brought to the operating room and transferred to the operative table. After induction of general anesthesia, the patient was positioned on the operative table in the supine position with all pressure points meticulously padded. The skin of the neck was then prepped and draped in the usual sterile fashion.  After timeout was conducted, the skin was infiltrated with local anesthetic. Skin incision was then made sharply and Bovie electrocautery was used to dissect the subcutaneous tissue until the platysma was identified.  The platysma was then divided and undermined. The sternocleidomastoid muscle was then identified and, utilizing natural fascial planes in the neck, the prevertebral fascia was identified and the carotid sheath was retracted laterally and the trachea and esophagus retracted medially. The omohyoid muscle was identified, circumferentially dissected, and reflected inferiorly. Using fluoroscopy, the correct disc spaces were identified. Bovie electrocautery was used to dissect in the subperiosteal plane and elevate the bilateral longus coli muscles. Table mounted retractor were then placed. At this point, the microscope was draped and brought into the field, and the remainder of the case was done under the microscope using microdissecting technique.  The C3-4 disc space was incised sharply and rongeurs were use to initially complete a discectomy. The high-speed drill was then used to complete discectomy until the posterior annulus was identified and removed and the posterior longitudinal ligament was identified. Using a nerve hook, the PLL was elevated, and Kerrison rongeurs were used to remove the posterior longitudinal ligament and the ventral thecal sac was identified. The posterior longitudinal ligament was noted to be significantly thickened, with partially calcified disc fragments significantly indenting the thecal sac slightly eccentric to the left. Using a combination of curettes and rongeurs, complete decompression of the thecal sac and exiting nerve roots at this level was completed, and verified using micro-nerve hook.  At this point, a 6 mm lordotic interbody cage was sized and packed with morcellized bone allograft. This was then inserted and tapped into place.  Attention was then turned to the C4-5 level. In a similar fashion, discectomy was completed initially with curettes and rongeurs, and completed with the drill. The PLL was again identified, elevated and incised. Very similar findings were seen  at C4-5 with significantly spondylotic posterior longitudinal ligament, although compression at this level  did not seem as severe as C3-4. Using Kerrison rongeurs, decompression of the spinal cord and exiting rootswas completed and confirmed with a dissector.  A 6 mm lordotic interbody cage was then sized and filled with bone allograft, and tapped into place. Position of the interbody devices was then confirmed with fluoroscopy.  Attention was then turned to the C5-6 level. Initially, anterior osteophytes were drilled down so that the anterior vertebral body was flush with C4. In a similar fashion, discectomy was completed initially with curettes and rongeurs, and completed with the drill. The PLL was again identified, elevated and incised. Again seen at this level was significantly thickened posterior longitudinal ligament, with partially calcified disc material indenting the thecal sac significantly, slightly towards the left. Multiple attempts were made to decompress the thecal sac however there appeared to be significant compression behind the body of C6. This was inaccessible from the C5-6 disc space. I therefore elected to proceed with C6 corpectomy rather then C5-6 and C6-7 cervical discectomy.  The C6-7 disc space was identified, and again anterior osteophytes on the C7 vertebral body were drilled flush. Superficial discectomy was then completed, and a high-speed drill was used to drill down the inferior endplate of C6 and the disc space, until the posterior longitudinal ligament was identified. Using the high-speed drill, complete C6 corpectomy was then completed. Kerrison rongeurs were then used to elevate the posterior longitudinal ligament, and complete decompression of the thecal sac was then completed behind the body of C6, as well as partially behind the body of C7. Again noted at this level was significant compression of the thecal sac due to hypertrophic posterior longitudinal ligament, and  partially calcified disc material.  The corpectomy defect was then measured, and a 16 mm graft was selected. This was packed with morcellized bone allograft, and tapped into place.  A 77.5 mm translational plate was then selected, and placed across the anterior vertebral bodies. 13 mm screws were then placed in C3, C4, C5, and C7. Final AP and lateral fluoroscopic images demonstrated good position of the interbody devices as well as the anterior plate and screws.  At this point, after all counts were verified to be correct, meticulous hemostasis was secured using a combination of bipolar electrocautery and passive hemostatics. The platysma muscle was then closed using interrupted 3-0 Vicryl sutures, and the skin was closed with a interrupted subcuticular stitch. Dermabond was then applied as were sterile dressings. Drapes were then removed.  The patient tolerated the procedure well and was extubated in the room and taken to the postanesthesia care unit in stable condition. At the end of the case all sponge, needle, instrument, and cottonoid counts were correct.

## 2016-04-01 NOTE — Transfer of Care (Signed)
Immediate Anesthesia Transfer of Care Note  Patient: [name]  Procedure(s) Performed: Procedure(s) with comments: ANTERIOR CERVICAL DECOMPRESSION/DISCECTOMY FUSION CERVICAL THREE- CERVICAL FOUR, CERVICAL FOUR-CERVICAL FIVE, CORPECTOMY C6 (N/A) - ANTERIOR CERVICAL DECOMPRESSION/DISCECTOMY FUSION C3-C4, C4-C5,C5-C6,C6-C7  Patient Location: PACU  Anesthesia Type:General  Level of Consciousness: awake, alert , oriented and patient cooperative  Airway & Oxygen Therapy: Patient Spontanous Breathing and Patient connected to nasal cannula oxygen  Post-op Assessment: Report given to RN and Post -op Vital signs reviewed and stable  Post vital signs: Reviewed and stable  Last Vitals:  Vitals:   04/01/16 0632 04/01/16 1254  BP: (!) 159/90   Pulse: (!) 53   Resp: 18   Temp: 37.1 C (P) 36.6 C    Last Pain:  Vitals:   04/01/16 16100632  TempSrc: Oral         Complications: No apparent anesthesia complications

## 2016-04-01 NOTE — Anesthesia Preprocedure Evaluation (Signed)
Anesthesia Evaluation  Patient identified by MRN, date of birth, ID band Patient awake    Reviewed: Allergy & Precautions, NPO status , Patient's Chart, lab work & pertinent test results  Airway Mallampati: II  TM Distance: >3 FB Neck ROM: Full    Dental no notable dental hx.    Pulmonary COPD, former smoker,    Pulmonary exam normal breath sounds clear to auscultation       Cardiovascular hypertension, Normal cardiovascular exam Rhythm:Regular Rate:Normal     Neuro/Psych Anxiety CVA    GI/Hepatic negative GI ROS, Neg liver ROS,   Endo/Other  diabetes  Renal/GU negative Renal ROS  negative genitourinary   Musculoskeletal negative musculoskeletal ROS (+)   Abdominal   Peds negative pediatric ROS (+)  Hematology negative hematology ROS (+)   Anesthesia Other Findings   Reproductive/Obstetrics negative OB ROS                             Anesthesia Physical Anesthesia Plan  ASA: III  Anesthesia Plan: General   Post-op Pain Management:    Induction: Intravenous  Airway Management Planned: Oral ETT and Video Laryngoscope Planned  Additional Equipment:   Intra-op Plan:   Post-operative Plan: Extubation in OR  Informed Consent: I have reviewed the patients History and Physical, chart, labs and discussed the procedure including the risks, benefits and alternatives for the proposed anesthesia with the patient or authorized representative who has indicated his/her understanding and acceptance.   Dental advisory given  Plan Discussed with: CRNA and Surgeon  Anesthesia Plan Comments:         Anesthesia Quick Evaluation

## 2016-04-02 ENCOUNTER — Encounter (HOSPITAL_COMMUNITY): Payer: Self-pay | Admitting: General Practice

## 2016-04-02 LAB — GLUCOSE, CAPILLARY
GLUCOSE-CAPILLARY: 168 mg/dL — AB (ref 65–99)
GLUCOSE-CAPILLARY: 147 mg/dL — AB (ref 65–99)
GLUCOSE-CAPILLARY: 171 mg/dL — AB (ref 65–99)
Glucose-Capillary: 177 mg/dL — ABNORMAL HIGH (ref 65–99)
Glucose-Capillary: 299 mg/dL — ABNORMAL HIGH (ref 65–99)

## 2016-04-02 MED ORDER — RESOURCE THICKENUP CLEAR PO POWD
ORAL | Status: DC | PRN
  Filled 2016-04-02: qty 125

## 2016-04-02 NOTE — Evaluation (Signed)
Occupational Therapy Evaluation Patient Details Name: MAJESTI GAMBRELL MRN: [id number] DOB: 11/05/1945 Today's Date: 04/02/2016    History of Present Illness Pt is a 70 yo female who underwent a ACDF x4 levels for gait instability and LE weakness x 3 years due to cervical cord compression. PMH: schizophrenia, anxiety.   Clinical Impression   Pt admitted with above. She demonstrates the below listed deficits and will benefit from continued OT to maximize safety and independence with BADLs.  Pt presents to OT with generalized weakness, impaired FMC, impaired sensation bil. Hands (subjective), and pain.  She currently requires min - mod A for ADLs.  Family works during the day.  Recommend SNF level rehab.       Follow Up Recommendations  SNF;Supervision/Assistance - 24 hour    Equipment Recommendations  None recommended by OT    Recommendations for Other Services       Precautions / Restrictions Precautions Precautions: Fall Precaution Comments: cervical precautions-educated pt Required Braces or Orthoses: Cervical Brace Cervical Brace: Hard collar Restrictions Weight Bearing Restrictions: No      Mobility Bed Mobility Overal bed mobility: Needs Assistance Bed Mobility: Rolling;Sidelying to Sit;Sit to Sidelying Rolling: Min assist Sidelying to sit: Min guard     Sit to sidelying: Mod assist General bed mobility comments: max directional v/c's, pt initiated LEs mvmt off bed, HOB elevated, assist required for LE management back into bed  Transfers Overall transfer level: Needs assistance Equipment used: Rolling walker (2 wheeled) Transfers: Sit to/from Stand Sit to Stand: Min assist;Mod assist         General transfer comment: pt unsteady upon standing    Balance Overall balance assessment: Needs assistance Sitting-balance support: Feet supported;No upper extremity supported Sitting balance-Leahy Scale: Fair Sitting balance - Comments: pt sat EOB and assisted  with hygiene   Standing balance support: Single extremity supported Standing balance-Leahy Scale: Poor Standing balance comment: pt stood to perform pericare with minA to maintain balance                            ADL Overall ADL's : Needs assistance/impaired Eating/Feeding: Minimal assistance;Sitting   Grooming: Wash/dry hands;Wash/dry face;Set up;Sitting   Upper Body Bathing: Set up;Supervision/ safety;Sitting   Lower Body Bathing: Moderate assistance;Sit to/from stand   Upper Body Dressing : Moderate assistance;Sitting   Lower Body Dressing: Maximal assistance;Sit to/from stand   Toilet Transfer: Minimal assistance;Ambulation;Comfort height toilet;Grab bars;RW   Toileting- Clothing Manipulation and Hygiene: Minimal assistance;Sit to/from stand       Functional mobility during ADLs: Minimal assistance;Rolling walker       Vision     Perception     Praxis      Pertinent Vitals/Pain Pain Assessment: 0-10 Pain Score: 10-Worst pain ever Pain Location: neck  Pain Descriptors / Indicators: Aching;Operative site guarding Pain Intervention(s): Limited activity within patient's tolerance;Monitored during session;Repositioned;RN gave pain meds during session     Hand Dominance Right   Extremity/Trunk Assessment Upper Extremity Assessment Upper Extremity Assessment: RUE deficits/detail;Generalized weakness;LUE deficits/detail RUE Deficits / Details:   Pt with limited shoulder flexion due to previous CVA and rotator cuff injury.  Pt with impaired FMC, and reports decreased light touch sensation bil hands.  RUE Sensation: decreased light touch RUE Coordination: decreased fine motor LUE Deficits / Details: Pt with impaired FMC, and reports decreased light touch sensation bil hands.  LUE Sensation: decreased light touch LUE Coordination: decreased fine motor   Lower Extremity Assessment  Lower Extremity Assessment: Defer to PT evaluation;RLE  deficits/detail;LLE deficits/detail RLE Deficits / Details: grossly 3/5 RLE Sensation: decreased light touch RLE Coordination: decreased gross motor LLE Deficits / Details: grossly 3/5 LLE Sensation: history of peripheral neuropathy LLE Coordination: decreased gross motor   Cervical / Trunk Assessment Cervical / Trunk Assessment: Other exceptions Cervical / Trunk Exceptions: limited due to cervical fusion    Communication Communication Communication: Expressive difficulties (dysarthria )   Cognition Arousal/Alertness: Awake/alert Behavior During Therapy: WFL for tasks assessed/performed Overall Cognitive Status: Within Functional Limits for tasks assessed                     General Comments       Exercises       Shoulder Instructions      Home Living Family/patient expects to be discharged to:: Skilled nursing facility Living Arrangements: Children                               Additional Comments: pt lives with daughter usually, however she works      Prior Functioning/Environment Level of Independence: Needs Financial plannerassistance  Gait / Transfers Assistance Needed: used rollator ADL's / Homemaking Assistance Needed: requires some help recently due to extremity weakness   Comments: required assist with fasterners and IADLs     OT Diagnosis: Generalized weakness;Acute pain   OT Problem List: Decreased strength;Decreased range of motion;Impaired balance (sitting and/or standing);Decreased activity tolerance;Decreased coordination;Decreased knowledge of use of DME or AE;Decreased knowledge of precautions;Impaired sensation;Impaired UE functional use;Pain   OT Treatment/Interventions: Self-care/ADL training;DME and/or AE instruction;Therapeutic activities;Balance training;Patient/family education    OT Goals(Current goals can be found in the care plan section) Acute Rehab OT Goals Patient Stated Goal: feel better  OT Goal Formulation: With patient Time  For Goal Achievement: 04/16/16 ADL Goals Pt Will Perform Grooming: with min guard assist;standing Pt Will Perform Lower Body Bathing: with min guard assist;sit to/from stand;with adaptive equipment Pt Will Perform Lower Body Dressing: with min guard assist;sit to/from stand Pt Will Transfer to Toilet: with min guard assist;ambulating;regular height toilet;bedside commode;grab bars Pt Will Perform Toileting - Clothing Manipulation and hygiene: with min guard assist;sit to/from stand;with adaptive equipment  OT Frequency: Min 2X/week   Barriers to D/C: Decreased caregiver support  family works during the day        Co-evaluation PT/OT/SLP Co-Evaluation/Treatment: Yes Reason for Co-Treatment: For patient/therapist safety   OT goals addressed during session: ADL's and self-care      End of Session Equipment Utilized During Treatment: Rolling walker;Cervical collar Nurse Communication: Mobility status;Patient requests pain meds  Activity Tolerance: Patient limited by pain;Patient limited by fatigue Patient left: in bed;with call bell/phone within reach;with bed alarm set   Time: 6045-40981402-1422 OT Time Calculation (min): 20 min Charges:  OT General Charges $OT Visit: 1 Procedure OT Evaluation $OT Eval Moderate Complexity: 1 Procedure G-Codes:    Shatori Bertucci M 04/02/2016, 4:20 PM

## 2016-04-02 NOTE — Evaluation (Signed)
Physical Therapy Evaluation Patient Details Name: [name] D Fodera MRN: [id number] DOB: 02/21/1946 Today's Date: 04/02/2016   History of Present Illness  Pt is a 70 yo female who underwent a ACDF x4 levels for gait instability and LE weakness x 3 years due to cervical cord compression. PMH: schizophrenia, anxiety.  Clinical Impression  Pt admitted with above. Pt with bilat UE and LE weakness, impaired balance, increased falls risk, and significant neck pain limiting OOB mobility this date. Pt to benefit from ST-SNF upon d/c to maximize functionally recovery for safe transition home with daughter.    Follow Up Recommendations SNF;Supervision/Assistance - 24 hour (family requesting [facility])    Equipment Recommendations  None recommended by PT    Recommendations for Other Services       Precautions / Restrictions Precautions Precautions: Fall Precaution Comments: cervical precautions-educated pt Required Braces or Orthoses: Cervical Brace Cervical Brace: Hard collar Restrictions Weight Bearing Restrictions: No      Mobility  Bed Mobility Overal bed mobility: Needs Assistance Bed Mobility: Rolling;Sidelying to Sit;Sit to Sidelying Rolling: Min assist Sidelying to sit: Min guard     Sit to sidelying: Mod assist General bed mobility comments: max directional v/c's, pt initiated LEs mvmt off bed, HOB elevated, assist required for LE management back into bed  Transfers Overall transfer level: Needs assistance Equipment used: Rolling walker (2 wheeled) Transfers: Sit to/from Stand Sit to Stand: Min assist;Mod assist         General transfer comment: pt unsteady upon standing  Ambulation/Gait Ambulation/Gait assistance: Min assist;Mod assist;+2 safety/equipment Ambulation Distance (Feet): 20 Feet Assistive device: Rolling walker (2 wheeled) (2nd person for line mangement) Gait Pattern/deviations: Decreased stride length;Shuffle Gait velocity: slow Gait velocity  interpretation: Below normal speed for age/gender General Gait Details: shuffled gait pattern with extremely narrow support, v/c's to increased step lenght, pt shaky with increased pain  Stairs            Wheelchair Mobility    Modified Rankin (Stroke Patients Only)       Balance Overall balance assessment: Needs assistance Sitting-balance support: Feet supported;No upper extremity supported Sitting balance-Leahy Scale: Fair Sitting balance - Comments: pt sat EOB and assisted with hygiene   Standing balance support: Single extremity supported Standing balance-Leahy Scale: Poor Standing balance comment: pt stood to perform pericare with minA to maintain balance                             Pertinent Vitals/Pain Pain Assessment: 0-10 Pain Score: 10-Worst pain ever Pain Location: neck Pain Intervention(s): RN gave pain meds during session    Home Living Family/patient expects to be discharged to:: Skilled nursing facility Living Arrangements: Children               Additional Comments: pt lives with daughter usually, however she works    Prior Function Level of Independence: Soil scientisteeds assistance   Gait / Transfers Assistance Needed: used rollator  ADL's / Homemaking Assistance Needed: requires some help recently due to extremity weakness        Hand Dominance   Dominant Hand: Right    Extremity/Trunk Assessment   Upper Extremity Assessment: Defer to OT evaluation           Lower Extremity Assessment: LLE deficits/detail;RLE deficits/detail RLE Deficits / Details: grossly 3/5 LLE Deficits / Details: grossly 3/5  Cervical / Trunk Assessment:  (neck surgery)  Communication   Communication: Expressive difficulties (dysarthric)  Cognition  Arousal/Alertness: Awake/alert Behavior During Therapy: WFL for tasks assessed/performed Overall Cognitive Status: Within Functional Limits for tasks assessed (but does have pysch history)                       General Comments General comments (skin integrity, edema, etc.): pt sat EOB with OT and assisted with bathing    Exercises        Assessment/Plan    PT Assessment Patient needs continued PT services  PT Diagnosis Difficulty walking;Generalized weakness   PT Problem List Decreased strength;Decreased activity tolerance;Decreased range of motion;Decreased balance;Decreased mobility;Decreased coordination;Decreased cognition;Impaired sensation  PT Treatment Interventions DME instruction;Gait training;Stair training;Functional mobility training;Therapeutic activities;Therapeutic exercise;Balance training;Neuromuscular re-education;Patient/family education   PT Goals (Current goals can be found in the Care Plan section) Acute Rehab PT Goals Patient Stated Goal: stop the pain PT Goal Formulation: With patient Time For Goal Achievement: 04/16/16 Potential to Achieve Goals: Good    Frequency Min 5X/week   Barriers to discharge Decreased caregiver support daughter works    Co-evaluation               End of Session Equipment Utilized During Treatment: Gait belt;Cervical collar Activity Tolerance: Patient limited by pain Patient left: in bed;with call bell/phone within reach;with bed alarm set Nurse Communication: Mobility status         Time: 9604-5409 PT Time Calculation (min) (ACUTE ONLY): 16 min   Charges:   PT Evaluation $PT Eval Moderate Complexity: 1 Procedure     PT G CodesMarcene Brawn 04/02/2016, 3:24 PM   Lewis Shock, PT, DPT Pager #: [phone] Office #: [phone]

## 2016-04-02 NOTE — Progress Notes (Signed)
No acute events Awake and alert Moving extremities well Neck c/d/i and flat Stable Therapy

## 2016-04-02 NOTE — Progress Notes (Addendum)
Pt voices concerns that she is really thirsty and wanted to try ice chips.Pt verbalized that she feels much better. Ice chips offered per requests and able to swallow. Pt able to swallow oral liquids without any sign of coughing or difficulty at this time.  Pt noted performing ICS on her own. RN still rec'ed speech to see pt prior to having solid food. Family at bedside. Per family, one of pt's dght will bring in her dentures today.  Sim BoastHavy, RN

## 2016-04-02 NOTE — Evaluation (Signed)
Clinical/Bedside Swallow Evaluation Patient Details  Name: [name] MRN: [id number] Date of Birth: [birth year]  Today's Date: 04/02/2016 Time: SLP Start Time (ACUTE ONLY): 2130 SLP Stop Time (ACUTE ONLY): 0836 SLP Time Calculation (min) (ACUTE ONLY): 25 min  Past Medical History:  Past Medical History:  Diagnosis Date  . Anxiety   . Cervical spondylosis with myelopathy   . Complication of anesthesia    hard time waking up with last procedure years ago  . COPD (chronic obstructive pulmonary disease) (HCC)   . Depression   . Diabetes mellitus without complication (HCC)   . Diabetic neuropathy (HCC)   . Headache   . Hypertension   . Memory deficit   . Pancreatitis   . Schizophrenia (HCC)   . Stroke (HCC)   . Wears dentures   . Wears glasses    Past Surgical History:  Past Surgical History:  Procedure Laterality Date  . FOOT SURGERY    . MULTIPLE TOOTH EXTRACTIONS    . RHINOPLASTY    . ROTATOR CUFF REPAIR    . TONSILLECTOMY    . TUBAL LIGATION     HPI:  [name] a 70 y.o.woman presenting to the office with progressive weakness and difficulty walking. MRI demonstrated severe spinal cord compression at C3-C7. Treatment options were discussed, and she elected to proceed with surgical decompression and fusion. Pt developed swallow difficulty post op, ST to evaluate swallow function.    Assessment / Plan / Recommendation Clinical Impression  Pt presents with mild to moderate oropharyngeal dysphagia. Pt with suspected reduced airway protection following thin liquids which was characterized by intermittent wet vocal quality and delayed coughing. Facial grimacing evidenced and pt reports odynophagia. Nectar thick liquids without overt signs or symptoms of aspiration. Pts cervical collar limits oral musculature ROM and impacts ability for functional mastication of solid PO. Recommend conservative diet of nectar thick liquids and dysphagia 2 (chopped) with medicines  whole in puree. ST to follow up for diet tolerance.     Aspiration Risk  Moderate aspiration risk    Diet Recommendation   Dysphagia 2 (chopped) Nectar thick liquids   Medication Administration: Whole meds with puree; crush larger pills    Other  Recommendations Oral Care Recommendations: Oral care BID Other Recommendations: Order thickener from pharmacy   Follow up Recommendations  Skilled Nursing facility;24 hour supervision/assistance    Frequency and Duration min 2x/week  1 week       Prognosis        Swallow Study   General Date of Onset: 04/01/16 HPI: [name] a 70 y.o.woman presenting to the office with progressive weakness and difficulty walking. MRI demonstrated severe spinal cord compression at C3-C7. Treatment options were discussed, and she elected to proceed with surgical decompression and fusion. Pt developed swallow difficulty post op, ST to evaluate swallow function.  Type of Study: Bedside Swallow Evaluation Previous Swallow Assessment: no prior hx Diet Prior to this Study: NPO Temperature Spikes Noted: Yes Respiratory Status: Room air Behavior/Cognition: Alert;Cooperative Oral Cavity Assessment: Within Functional Limits Oral Cavity - Dentition: Edentulous;Dentures, not available Vision: Functional for self-feeding Self-Feeding Abilities: Needs assist Patient Positioning: Upright in bed Baseline Vocal Quality: Breathy;Low vocal intensity Volitional Cough: Weak Volitional Swallow: Unable to elicit    Oral/Motor/Sensory Function Overall Oral Motor/Sensory Function: Generalized oral weakness   Ice Chips Ice chips: Impaired Presentation: Spoon Oral Phase Impairments: Reduced lingual movement/coordination Oral Phase Functional Implications: Prolonged oral transit Pharyngeal Phase Impairments: Suspected delayed Swallow;Multiple swallows  Thin Liquid Thin Liquid: Impaired Presentation: Cup;Straw Oral Phase Impairments: Reduced lingual  movement/coordination Oral Phase Functional Implications: Prolonged oral transit Pharyngeal  Phase Impairments: Suspected delayed Swallow;Multiple swallows;Wet Vocal Quality;Cough - Delayed;Throat Clearing - Delayed;Decreased hyoid-laryngeal movement    Nectar Thick Nectar Thick Liquid: Impaired Presentation: Straw;Cup Oral Phase Impairments: Reduced lingual movement/coordination Oral phase functional implications: Prolonged oral transit Pharyngeal Phase Impairments: Suspected delayed Swallow;Multiple swallows;Throat Clearing - Delayed   Honey Thick Honey Thick Liquid: Not tested   Puree Puree: Within functional limits   Solid   GO   Solid: Impaired Oral Phase Impairments: Impaired mastication;Reduced lingual movement/coordination Oral Phase Functional Implications: Prolonged oral transit;Impaired mastication Pharyngeal Phase Impairments: Suspected delayed Swallow;Multiple swallows       Marcene Duoshelsea Sumney MA, CCC-SLP Acute Care Speech Language Pathologist    Kennieth RadSumney, Signe Tackitt E 04/02/2016,8:42 AM

## 2016-04-03 LAB — GLUCOSE, CAPILLARY
GLUCOSE-CAPILLARY: 124 mg/dL — AB (ref 65–99)
GLUCOSE-CAPILLARY: 161 mg/dL — AB (ref 65–99)
Glucose-Capillary: 116 mg/dL — ABNORMAL HIGH (ref 65–99)
Glucose-Capillary: 164 mg/dL — ABNORMAL HIGH (ref 65–99)
Glucose-Capillary: 168 mg/dL — ABNORMAL HIGH (ref 65–99)
Glucose-Capillary: 205 mg/dL — ABNORMAL HIGH (ref 65–99)

## 2016-04-03 MED ORDER — GLUCERNA SHAKE PO LIQD
237.0000 mL | Freq: Three times a day (TID) | ORAL | Status: DC
  Administered 2016-04-03 – 2016-04-05 (×6): 237 mL via ORAL
  Filled 2016-04-03 (×9): qty 237

## 2016-04-03 NOTE — Progress Notes (Signed)
Pt BP has come up to 111/58 pulse 70 all other vital signs normal.

## 2016-04-03 NOTE — Progress Notes (Signed)
Subjective: Patient reports doing well  Objective: Vital signs in last 24 hours: Temp:  [97.5 F (36.4 C)-100.1 F (37.8 C)] 97.8 F (36.6 C) (09/03 0422) Pulse Rate:  [65-103] 70 (09/03 0422) Resp:  [16-18] 16 (09/03 0422) BP: (83-147)/(53-87) 111/58 (09/03 0422) SpO2:  [95 %-100 %] 95 % (09/03 0422) Weight:  [57.2 kg (125 lb 15.9 oz)] 57.2 kg (125 lb 15.9 oz) (09/02 2300)  Intake/Output from previous day: 09/02 0701 - 09/03 0700 In: 828 [I.V.:828] Out: 400 [Urine:400] Intake/Output this shift: No intake/output data recorded.  Physical Exam: Dressing CDI.  Less difficulty with swallowing.  Strength good bilateral D/B/T/HI.  MAEW.  Feels she is stronger.  Lab Results: No results for input(s): WBC, HGB, HCT, PLT in the last 72 hours. BMET No results for input(s): NA, K, CL, CO2, GLUCOSE, BUN, CREATININE, CALCIUM in the last 72 hours.  Studies/Results: Dg Cervical Spine 2-3 Views  Result Date: 04/01/2016 CLINICAL DATA:  C3-C7 ACDF. EXAM: DG C-ARM 61-120 MIN; CERVICAL SPINE - 2-3 VIEW COMPARISON:  03/04/2016 cervical spine MRI. FINDINGS: Fluoroscopy time 0 minutes 8 seconds. Two spot fluoroscopic nondiagnostic intraoperative cervical spine radiographs demonstrate postsurgical changes from C3-C7 ACDF. Tubes and wires overlie the anterior neck. IMPRESSION: Intraoperative fluoroscopic guidance for C3-C7 ACDF. Electronically Signed   By: Delbert PhenixJason A Poff M.D.   On: 04/01/2016 15:23   Dg C-arm 1-60 Min  Result Date: 04/01/2016 CLINICAL DATA:  C3-C7 ACDF. EXAM: DG C-ARM 61-120 MIN; CERVICAL SPINE - 2-3 VIEW COMPARISON:  03/04/2016 cervical spine MRI. FINDINGS: Fluoroscopy time 0 minutes 8 seconds. Two spot fluoroscopic nondiagnostic intraoperative cervical spine radiographs demonstrate postsurgical changes from C3-C7 ACDF. Tubes and wires overlie the anterior neck. IMPRESSION: Intraoperative fluoroscopic guidance for C3-C7 ACDF. Electronically Signed   By: Delbert PhenixJason A Poff M.D.   On: 04/01/2016  15:23    Assessment/Plan: Mobilize with PT.  Patient is making good progress.    LOS: 2 days    Dorian HeckleSTERN,Dink Creps D, MD 04/03/2016, 9:53 AM

## 2016-04-03 NOTE — Progress Notes (Signed)
Physical Therapy Treatment Patient Details Name: Lurline DelWallace D Gaumer MRN: [id number] DOB: 19-Apr-1946 Today's Date: 04/03/2016    History of Present Illness Pt is a 70 yo female who underwent a ACDF x4 levels for gait instability and LE weakness x 3 years due to cervical cord compression. PMH: schizophrenia, anxiety.    PT Comments    Pt progressing well towards all goals. Pt with improved gait tolerance this date. Con't to recommend SNF upon d/c to progress indep with mobility.  Follow Up Recommendations  SNF;Supervision/Assistance - 24 hour     Equipment Recommendations  None recommended by PT    Recommendations for Other Services       Precautions / Restrictions Precautions Precautions: Fall Precaution Comments: cervical precautions-educated pt Required Braces or Orthoses: Cervical Brace Cervical Brace: Hard collar Restrictions Weight Bearing Restrictions: No    Mobility  Bed Mobility Overal bed mobility: Needs Assistance Bed Mobility: Rolling;Sidelying to Sit;Sit to Sidelying Rolling: Min assist Sidelying to sit: Min guard       General bed mobility comments: pt with good technique and adherence to precautions, used bed rail  Transfers Overall transfer level: Needs assistance Equipment used: Rolling walker (2 wheeled) Transfers: Sit to/from Stand Sit to Stand: Min assist         General transfer comment: v/c's for safe hand placement  Ambulation/Gait Ambulation/Gait assistance: Min assist Ambulation Distance (Feet): 50 Feet (x2) Assistive device: Rolling walker (2 wheeled) Gait Pattern/deviations: Step-through pattern;Decreased stride length;Narrow base of support Gait velocity: slow Gait velocity interpretation: Below normal speed for age/gender General Gait Details: shuffled gait pattern with extremely narrow support, v/c's to increased step lenght, pt shaky with increased pain   Stairs            Wheelchair Mobility    Modified Rankin  (Stroke Patients Only)       Balance Overall balance assessment: Needs assistance Sitting-balance support: Feet supported;No upper extremity supported Sitting balance-Leahy Scale: Fair     Standing balance support: Single extremity supported;Bilateral upper extremity supported Standing balance-Leahy Scale: Poor Standing balance comment: needs RW                    Cognition Arousal/Alertness: Awake/alert Behavior During Therapy: WFL for tasks assessed/performed Overall Cognitive Status: Within Functional Limits for tasks assessed                      Exercises      General Comments        Pertinent Vitals/Pain Pain Assessment: 0-10 Pain Score: 5  Pain Location: soar throat Pain Intervention(s): RN gave pain meds during session    Home Living                      Prior Function            PT Goals (current goals can now be found in the care plan section) Progress towards PT goals: Progressing toward goals    Frequency  Min 5X/week    PT Plan Current plan remains appropriate    Co-evaluation             End of Session Equipment Utilized During Treatment: Gait belt;Cervical collar Activity Tolerance: Patient tolerated treatment well Patient left: in chair;with call bell/phone within reach;with chair alarm set     Time: [id number] PT Time Calculation (min) (ACUTE ONLY): 22 min  Charges:  $Gait Training: 8-22 mins  G CodesMarcene Brawn 04/03/2016, 2:29 PM   Lewis Shock, PT, DPT Pager #: [phone] Office #: [phone]

## 2016-04-03 NOTE — Progress Notes (Signed)
Pt's BP is soft on dynamap 83/53 taken manually it was 102/55 Hypotension is a common side affect of her sleeping medicine which she received for the first time since surgery to night her daughter is in the room with her. She is asymptomatic and all other vital signs are normal, will assess her again around 0400.

## 2016-04-04 ENCOUNTER — Encounter (HOSPITAL_COMMUNITY): Payer: Self-pay | Admitting: Neurosurgery

## 2016-04-04 LAB — GLUCOSE, CAPILLARY
GLUCOSE-CAPILLARY: 135 mg/dL — AB (ref 65–99)
GLUCOSE-CAPILLARY: 149 mg/dL — AB (ref 65–99)
Glucose-Capillary: 127 mg/dL — ABNORMAL HIGH (ref 65–99)
Glucose-Capillary: 164 mg/dL — ABNORMAL HIGH (ref 65–99)
Glucose-Capillary: 164 mg/dL — ABNORMAL HIGH (ref 65–99)
Glucose-Capillary: 169 mg/dL — ABNORMAL HIGH (ref 65–99)

## 2016-04-04 NOTE — Clinical Social Work Note (Signed)
Clinical Social Work Assessment  Patient Details  Name: [name] MRN: [id number] Date of Birth: [birth year]  Date of referral:  04/04/16               Reason for consult:  Facility Placement                Permission sought to share information with:  Family Supports, Guardian Permission granted to share information::  Yes, Verbal Permission Granted  Name::     Madolyn Frieze  Relationship::  daughter/guardian  Contact Information:  [phone]  Housing/Transportation Living arrangements for the past 2 months:  [place] of Information:  Adult Children, Guardian Patient Interpreter Needed:  None Criminal Activity/Legal Involvement Pertinent to Current Situation/Hospitalization:  No - Comment as needed Significant Relationships:  Adult Children, Other Family Members Lives with:  Adult Children Do you feel safe going back to the place where you live?  Yes Need for family participation in patient care:  Yes (Comment)  Care giving concerns:  No care giving concerns identified.   Social Worker assessment / plan:  CSW met with pt's daughter, Velva Harman, to address consult for New SNF. CSW introduced herself and explained role of social work. CSW also explained the process of discharging to SNF. Pt was sleeping soundly during assessment. Pt's daughter, Velva Harman, is pt's guardian, and paperwork has been placed on chart. Pt lives with Velva Harman and her family. Pt has a history of schizophrenia, depression, and anxiety. Pt has been seen at [facility] to establish with a psychiatrist. Pt was last seen on 03/16/2016. Pt's daughter shared that she has preregistered with [employer] for STR. PT is recommending STR at SNF. CSW spoke with [facility] and they are able to accept pt at discharge. PASARR is currently under manual review. CSW will continue to follow.   Employment status:  Retired Forensic scientist:  Medicare PT Recommendations:  [place] / Referral to community resources:  [place]  Patient/Family's Response to care:  Pt's daughter was Patent attorney of CSW support.   Patient/Family's Understanding of and Emotional Response to Diagnosis, Current Treatment, and Prognosis:  Pt's daughter understands that pt needs STR at discharge in order to return home safely.   Emotional Assessment Appearance:  Appears stated age Attitude/Demeanor/Rapport:  Other Affect (typically observed):  Other Orientation:  Oriented to Self Alcohol / Substance use:  Never Used Psych involvement (Current and /or in the community):  Outpatient Provider  Discharge Needs  Concerns to be addressed:  Adjustment to Illness Readmission within the last 30 days:  No Current discharge risk:  None Barriers to Discharge:    Continued Medical Workup  Darden Dates, LCSW 04/04/2016, 11:27 AM

## 2016-04-04 NOTE — Progress Notes (Signed)
Pt having very difficult time swallowing and the aspen collar she has does not fit her well. Will inform on coming day nurse that the collar needs to be re evaluated by bio med or who ever sets the patients up with these.

## 2016-04-04 NOTE — Care Management Note (Signed)
Case Management Note  Patient Details  Name: [name] MRN: [id number] Date of Birth: Sep 17, 1945  Subjective/Objective:      Pt underwent:ANTERIOR CERVICAL DECOMPRESSION/DISCECTOMY FUSION CERVICAL THREE- CERVICAL FOUR, CERVICAL FOUR-CERVICAL FIVE, CORPECTOMY C6. She is from home with family.                Action/Plan: Plan is SNF when medically ready. CM following for d/c needs.   Expected Discharge Date:                  Expected Discharge Plan:  Skilled Nursing Facility  In-House Referral:  Clinical Social Work  Discharge planning Services  CM Consult  Post Acute Care Choice:    Choice offered to:     DME Arranged:    DME Agency:     HH Arranged:    HH Agency:     Status of Service:  In process, will continue to follow  If discussed at Long Length of Stay Meetings, dates discussed:    Additional Comments:  Kermit BaloKelli F Josip Merolla, RN 04/04/2016, 4:34 PM

## 2016-04-04 NOTE — Progress Notes (Signed)
Physical Therapy Treatment Patient Details Name: [name] MRN: [id number] DOB: [birth year] Today's Date: 04/04/2016    History of Present Illness Pt is a 70 yo female who underwent a ACDF x4 levels for gait instability and LE weakness x 3 years due to cervical cord compression. PMH: schizophrenia, anxiety.    PT Comments    Pt with much less pain today during session, ambulated 90' with RW and min A, continues to have LE weakness L>R. PT will continue to follow.   Follow Up Recommendations  SNF;Supervision/Assistance - 24 hour     Equipment Recommendations  None recommended by PT    Recommendations for Other Services       Precautions / Restrictions Precautions Precautions: Fall Precaution Comments: cervical precautions-educated pt Required Braces or Orthoses: Cervical Brace Cervical Brace: Hard collar Restrictions Weight Bearing Restrictions: No    Mobility  Bed Mobility Overal bed mobility: Needs Assistance Bed Mobility: Supine to Sit;Sit to Supine Rolling: Supervision Sidelying to sit: Supervision Supine to sit: Supervision     General bed mobility comments: vc's for rolling before beginning to sit up but pt able to get out and back into bed without physical assist  Transfers Overall transfer level: Needs assistance Equipment used: Rolling walker (2 wheeled) Transfers: Sit to/from Stand Sit to Stand: Min guard         General transfer comment: v/c's for safe hand placement  Ambulation/Gait Ambulation/Gait assistance: Min assist Ambulation Distance (Feet): 90 Feet Assistive device: Rolling walker (2 wheeled) Gait Pattern/deviations: Step-through pattern;Decreased weight shift to left;Decreased step length - left;Narrow base of support Gait velocity: decreased Gait velocity interpretation: <1.8 ft/sec, indicative of risk for recurrent falls General Gait Details: pt with left foot shuffle, improved slightly with vc's. Fatigue after 50' and slower  return back to room.    Stairs            Wheelchair Mobility    Modified Rankin (Stroke Patients Only)       Balance Overall balance assessment: Needs assistance Sitting-balance support: No upper extremity supported Sitting balance-Leahy Scale: Fair     Standing balance support: Single extremity supported Standing balance-Leahy Scale: Poor                      Cognition Arousal/Alertness: Lethargic;Suspect due to medications Behavior During Therapy: WFL for tasks assessed/performed Overall Cognitive Status: Within Functional Limits for tasks assessed                      Exercises General Exercises - Lower Extremity Hip Flexion/Marching: AROM;10 reps;Standing Mini-Sqauts: 10 reps;Standing    General Comments General comments (skin integrity, edema, etc.): pt without pain after pain meds this afternoon, encouraged by that      Pertinent Vitals/Pain Pain Assessment: No/denies pain    Home Living                      Prior Function            PT Goals (current goals can now be found in the care plan section) Acute Rehab PT Goals Patient Stated Goal: feel better  PT Goal Formulation: With patient Time For Goal Achievement: 04/16/16 Potential to Achieve Goals: Good Progress towards PT goals: Progressing toward goals    Frequency  Min 5X/week    PT Plan Current plan remains appropriate    Cross-evaluation             End of Session  Equipment Utilized During Treatment: Gait belt;Cervical collar Activity Tolerance: Patient tolerated treatment well Patient left: in bed;with call bell/phone within reach     Time: 1255-1316 PT Time Calculation (min) (ACUTE ONLY): 21 min  Charges:  $Gait Training: 8-22 mins                    G Codes:     [name], PT  Acute Rehab Services  [phone]  [place], [place] 04/04/2016, 2:03 PM

## 2016-04-04 NOTE — Clinical Social Work Placement (Signed)
   CLINICAL SOCIAL WORK PLACEMENT  NOTE  Date:  04/04/2016  Patient Details  Name: [name] MRN: [id number] Date of Birth: 1946/05/20  Clinical Social Work is seeking post-discharge placement for this patient at the Skilled  Nursing Facility level of care (*CSW will initial, date and re-position this form in  chart as items are completed):  Yes   Patient/family provided with [facility] Clinical Social Work Department's list of facilities offering this level of care within the geographic area requested by the patient (or if unable, by the patient's family).  Yes   Patient/family informed of their freedom to choose among providers that offer the needed level of care, that participate in Medicare, Medicaid or managed care program needed by the patient, have an available bed and are willing to accept the patient.  Yes   Patient/family informed of [facility]'s ownership interest in [facility] and [facility], as well as of the fact that they are under no obligation to receive care at these facilities.  PASRR submitted to EDS on 04/04/16     PASRR number received on       Existing PASRR number confirmed on       FL2 transmitted to all facilities in geographic area requested by pt/family on 04/04/16     FL2 transmitted to all facilities within larger geographic area on       Patient informed that his/her managed care company has contracts with or will negotiate with certain facilities, including the following:        Yes   Patient/family informed of bed offers received.  [facility]     Physician recommends and patient chooses bed at      Patient to be transferred to   on  .  Patient to be transferred to facility by       Patient family notified on   of transfer.  Name of family member notified:        PHYSICIAN       Additional Comment:    _______________________________________________ Dede QuerySarah Tomeca Helm, LCSW 04/04/2016, 11:25 AM

## 2016-04-04 NOTE — Care Management Important Message (Signed)
Important Message  Patient Details  Name: [name] MRN: [id number] Date of Birth: 1946/05/09   Medicare Important Message Given:  Yes    [name] 04/04/2016, 9:25 AM

## 2016-04-04 NOTE — Progress Notes (Signed)
No acute events Moving arms and legs well Stable Therapy

## 2016-04-04 NOTE — Progress Notes (Signed)
Initial Nutrition Assessment  DOCUMENTATION CODES:   Not applicable  INTERVENTION:   Glucerna Shake po TID, each supplement provides 220 kcal and 10 grams of protein  NUTRITION DIAGNOSIS:   Inadequate oral intake related to  (decreased appetite) as evidenced by meal completion < 50%.  GOAL:   Patient will meet greater than or equal to 90% of their needs  MONITOR:   PO intake, Supplement acceptance, I & O's  REASON FOR ASSESSMENT:   Malnutrition Screening Tool    ASSESSMENT:   Pt is a [age] yo female who underwent a ACDF x4 levels for gait instability and LE weakness x 3 years due to cervical cord compression. PMH: schizophrenia, anxiety.  Meal Completion: 50% Per family pt eats oatmeal for breakfast, peanut butter and jelly sandwich for lunch and dinner with the family. They report that if they are not there while she eats she sometimes throws away food before she eats much of it. Mostly they feel she has been eating well. She has lost weight they feel due to increased activity. She had been mostly sitting on the sofa but recently started in home therapy and was more active. Reports she likes glucerna.   Medications reviewed and include: vitamin B 12, colace CBG's: 116-168 Labs reviewed  Unable to complete Nutrition-Focused physical exam at this time.  Pt sleeping soundly, family prefers if I do not disturb.  Per family pt will d/c to SNF today or tomorrow.   Diet Order:  DIET DYS 2 Room service appropriate? Yes; Fluid consistency: Nectar Thick  Skin:  Reviewed, no issues (closed neck incision)  Last BM:  unknown  Height:   Ht Readings from Last 1 Encounters:  04/02/16 5\' 4"  (1.626 m)    Weight:   Wt Readings from Last 1 Encounters:  04/02/16 125 lb 15.9 oz (57.2 kg)    Ideal Body Weight:  54.5 kg  BMI:  Body mass index is 21.63 kg/m.  Estimated Nutritional Needs:   Kcal:  1500-1700  Protein:  70-80 grams  Fluid:  > 1.5 L/day  EDUCATION NEEDS:    No education needs identified at this time  Kendell BaneHeather Vernis Cabacungan RD, LDN, CNSC [phone] Pager [phone] After Hours Pager

## 2016-04-04 NOTE — NC FL2 (Deleted)
[place] MEDICAID FL2 LEVEL OF CARE SCREENING TOOL     IDENTIFICATION  Patient Name: [name] Birthdate: Jul 31, 1946 Sex: female Admission Date (Current Location): 04/01/2016  [facility] and [place] Number:  Producer, television/film/video and Address:  [facility] [place], [address] [place], [place] [address]      Provider Number: [id number]  Attending Physician Name and Address:  Lisbeth Renshaw, MD  Relative Name and Phone Number:       Current Level of Care: Hospital Recommended Level of Care: Skilled Nursing Facility Prior Approval Number:    Date Approved/Denied:   PASRR Number:    Discharge Plan: SNF    Current Diagnoses: Patient Active Problem List   Diagnosis Date Noted  . Cervical spondylosis with myelopathy 04/01/2016  . Myelopathy (HCC) 02/19/2016  . Cognitive impairment 02/19/2016  . History of alcohol abuse 02/19/2016    Orientation RESPIRATION BLADDER Height & Weight     Self  Normal Continent Weight: 125 lb 15.9 oz (57.2 kg) Height:  5\' 4"  (162.6 cm)  BEHAVIORAL SYMPTOMS/MOOD NEUROLOGICAL BOWEL NUTRITION STATUS      Continent Diet (DYS 2, Nectar Thick, Medicines whole in puree, Full supervision with all PO)  AMBULATORY STATUS COMMUNICATION OF NEEDS Skin   Limited Assist   Normal                       Personal Care Assistance Level of Assistance  Bathing, Feeding, Dressing Bathing Assistance: Limited assistance Feeding assistance: Maximum assistance Dressing Assistance: Limited assistance     Functional Limitations Info  Sight, Hearing, Speech Sight Info: Adequate Hearing Info: Adequate Speech Info: Adequate    SPECIAL CARE FACTORS FREQUENCY  PT (By licensed PT), OT (By licensed OT)     PT Frequency: 5 OT Frequency: 5            Contractures Contractures Info: Not present    Additional Factors Info  Code Status, Allergies, Psychotropic, Insulin Sliding Scale Code Status Info: Full Code Allergies  Info: No Known Allergies Psychotropic Info: Medications Insulin Sliding Scale Info: 4x/day       Current Medications (04/04/2016):  This is the current hospital active medication list Current Facility-Administered Medications  Medication Dose Route Frequency Provider Last Rate Last Dose  . 0.9 %  sodium chloride infusion   Intravenous Continuous Lisbeth Renshaw, MD 75 mL/hr at 04/04/16 0018 980 mL at 04/04/16 0018  . 0.9 %  sodium chloride infusion  250 mL Intravenous Continuous Lisbeth Renshaw, MD      . acetaminophen (TYLENOL) tablet 650 mg  650 mg Oral Q4H PRN Lisbeth Renshaw, MD       Or  . acetaminophen (TYLENOL) suppository 650 mg  650 mg Rectal Q4H PRN Lisbeth Renshaw, MD      . albuterol (PROVENTIL) (2.5 MG/3ML) 0.083% nebulizer solution 2.5 mg  2.5 mg Nebulization Q6H PRN Lisbeth Renshaw, MD      . amLODipine (NORVASC) tablet 5 mg  5 mg Oral QHS Lisbeth Renshaw, MD   5 mg at 04/03/16 2244  . benazepril (LOTENSIN) tablet 10 mg  10 mg Oral QHS Lisbeth Renshaw, MD   10 mg at 04/03/16 2245  . bisacodyl (DULCOLAX) suppository 10 mg  10 mg Rectal Daily PRN Lisbeth Renshaw, MD      . Melene Muller ON 04/25/2016] cyanocobalamin ((VITAMIN B-12)) injection 1,000 mcg  1,000 mcg Intramuscular Q30 days Lisbeth Renshaw, MD      . docusate sodium (COLACE) capsule  100 mg  100 mg Oral BID Lisbeth Renshaw, MD   100 mg at 04/03/16 2245  . DULoxetine (CYMBALTA) DR capsule 60 mg  60 mg Oral Daily Lisbeth Renshaw, MD   60 mg at 04/04/16 0809  . feeding supplement (GLUCERNA SHAKE) (GLUCERNA SHAKE) liquid 237 mL  237 mL Oral TID BM Loura Halt Ditty, MD   237 mL at 04/04/16 1000  . fluticasone (FLONASE) 50 MCG/ACT nasal spray 1 spray  1 spray Each Nare Daily PRN Lisbeth Renshaw, MD      . hydrALAZINE (APRESOLINE) injection 5-10 mg  5-10 mg Intravenous Q4H PRN Maeola Harman, MD   10 mg at 04/02/16 0748  . HYDROmorphone (DILAUDID) injection 0.5-1 mg  0.5-1 mg Intravenous Q2H PRN Lisbeth Renshaw, MD   0.5 mg at 04/04/16 0528  . insulin aspart (novoLOG) injection 0-9 Units  0-9 Units Subcutaneous Q4H Maeola Harman, MD   1 Units at 04/04/16 0808  . linaclotide Karlene Einstein) capsule 145 mcg  145 mcg Oral Daily Lisbeth Renshaw, MD   145 mcg at 04/04/16 0809  . meclizine (ANTIVERT) tablet 25 mg  25 mg Oral TID PRN Lisbeth Renshaw, MD      . menthol-cetylpyridinium (CEPACOL) lozenge 3 mg  1 lozenge Oral PRN Lisbeth Renshaw, MD       Or  . phenol (CHLORASEPTIC) mouth spray 1 spray  1 spray Mouth/Throat PRN Lisbeth Renshaw, MD   1 spray at 04/03/16 0602  . metFORMIN (GLUCOPHAGE) tablet 500 mg  500 mg Oral BID WC Lisbeth Renshaw, MD   500 mg at 04/04/16 0809  . methocarbamol (ROBAXIN) tablet 500 mg  500 mg Oral Q6H PRN Lisbeth Renshaw, MD   500 mg at 04/04/16 0809   Or  . methocarbamol (ROBAXIN) 500 mg in dextrose 5 % 50 mL IVPB  500 mg Intravenous Q6H PRN Lisbeth Renshaw, MD   500 mg at 04/02/16 0019  . ondansetron (ZOFRAN) injection 4 mg  4 mg Intravenous Q4H PRN Lisbeth Renshaw, MD      . oxyCODONE-acetaminophen (PERCOCET/ROXICET) 5-325 MG per tablet 1-2 tablet  1-2 tablet Oral Q4H PRN Lisbeth Renshaw, MD   1 tablet at 04/04/16 0845  . pantoprazole (PROTONIX) EC tablet 20 mg  20 mg Oral Daily Lisbeth Renshaw, MD   20 mg at 04/04/16 0809  . polyethylene glycol (MIRALAX / GLYCOLAX) packet 17 g  17 g Oral Daily PRN Lisbeth Renshaw, MD      . RESOURCE THICKENUP CLEAR   Oral PRN Lisbeth Renshaw, MD      . rosuvastatin (CRESTOR) tablet 20 mg  20 mg Oral Daily Lisbeth Renshaw, MD   20 mg at 04/04/16 0809  . senna (SENOKOT) tablet 8.6 mg  1 tablet Oral BID Lisbeth Renshaw, MD   8.6 mg at 04/04/16 0809  . sodium chloride flush (NS) 0.9 % injection 3 mL  3 mL Intravenous Q12H Lisbeth Renshaw, MD   3 mL at 04/03/16 2246  . sodium chloride flush (NS) 0.9 % injection 3 mL  3 mL Intravenous PRN Lisbeth Renshaw, MD      . tiZANidine (ZANAFLEX) tablet 4 mg  4 mg Oral QHS  Lisbeth Renshaw, MD   4 mg at 04/03/16 2242  . traZODone (DESYREL) tablet 150 mg  150 mg Oral QHS Lisbeth Renshaw, MD   150 mg at 04/03/16 2254  . ziprasidone (GEODON) capsule 80 mg  80 mg Oral QHS Lisbeth Renshaw, MD   80 mg at 04/03/16 2242     Discharge  Medications: Please see discharge summary for a list of discharge medications.  Relevant Imaging Results:  Relevant Lab Results:   Additional Information SSN:  [id number]  Dede QuerySarah Deyonna Fitzsimmons, LCSW

## 2016-04-04 NOTE — [place] FL2 (Signed)
[place] MEDICAID FL2 LEVEL OF CARE SCREENING TOOL     IDENTIFICATION  Patient Name: [name] Birthdate: 07/20/1946 Sex: female Admission Date (Current Location): 04/01/2016  [facility] and [place] Number:  Producer, television/film/video and Address:  [facility], [address], [place], [place] [address]      Provider Number: [id number]  Attending Physician Name and Address:  Lisbeth Renshaw, MD  Relative Name and Phone Number:       Current Level of Care: Hospital Recommended Level of Care: Skilled Nursing Facility Prior Approval Number:    Date Approved/Denied:   PASRR Number:    Discharge Plan: SNF    Current Diagnoses: Patient Active Problem List   Diagnosis Date Noted  . Cervical spondylosis with myelopathy 04/01/2016  . Myelopathy (HCC) 02/19/2016  . Cognitive impairment 02/19/2016  . History of alcohol abuse 02/19/2016    Orientation RESPIRATION BLADDER Height & Weight     Self  Normal Continent Weight: 125 lb 15.9 oz (57.2 kg) Height:  5\' 4"  (162.6 cm)  BEHAVIORAL SYMPTOMS/MOOD NEUROLOGICAL BOWEL NUTRITION STATUS      Continent Diet (DYS 2, Nectar Thick, Medicines whole in puree, Full supervision with all PO)  AMBULATORY STATUS COMMUNICATION OF NEEDS Skin   Limited Assist Verbally Normal                       Personal Care Assistance Level of Assistance  Bathing, Feeding, Dressing Bathing Assistance: Limited assistance Feeding assistance: Maximum assistance Dressing Assistance: Limited assistance     Functional Limitations Info  Sight, Hearing, Speech Sight Info: Adequate Hearing Info: Adequate Speech Info: Adequate    SPECIAL CARE FACTORS FREQUENCY  PT (By licensed PT), OT (By licensed OT), Speech therapy     PT Frequency: 5 OT Frequency: 5     Speech Therapy Frequency: 5      Contractures Contractures Info: Not present    Additional Factors Info  Code Status, Allergies, Psychotropic, Insulin Sliding  Scale Code Status Info: Full Code Allergies Info: No Known Allergies Psychotropic Info: Medications Insulin Sliding Scale Info: 4x/day       Current Medications (04/04/2016):  This is the current hospital active medication list Current Facility-Administered Medications  Medication Dose Route Frequency Provider Last Rate Last Dose  . 0.9 %  sodium chloride infusion   Intravenous Continuous Lisbeth Renshaw, MD 75 mL/hr at 04/04/16 0018 980 mL at 04/04/16 0018  . 0.9 %  sodium chloride infusion  250 mL Intravenous Continuous Lisbeth Renshaw, MD      . acetaminophen (TYLENOL) tablet 650 mg  650 mg Oral Q4H PRN Lisbeth Renshaw, MD       Or  . acetaminophen (TYLENOL) suppository 650 mg  650 mg Rectal Q4H PRN Lisbeth Renshaw, MD      . albuterol (PROVENTIL) (2.5 MG/3ML) 0.083% nebulizer solution 2.5 mg  2.5 mg Nebulization Q6H PRN Lisbeth Renshaw, MD      . amLODipine (NORVASC) tablet 5 mg  5 mg Oral QHS Lisbeth Renshaw, MD   5 mg at 04/03/16 2244  . benazepril (LOTENSIN) tablet 10 mg  10 mg Oral QHS Lisbeth Renshaw, MD   10 mg at 04/03/16 2245  . bisacodyl (DULCOLAX) suppository 10 mg  10 mg Rectal Daily PRN Lisbeth Renshaw, MD      . Melene Muller ON 04/25/2016] cyanocobalamin ((VITAMIN B-12)) injection 1,000 mcg  1,000 mcg Intramuscular Q30 days Lisbeth Renshaw, MD      . docusate  sodium (COLACE) capsule 100 mg  100 mg Oral BID Lisbeth Renshaw, MD   100 mg at 04/03/16 2245  . DULoxetine (CYMBALTA) DR capsule 60 mg  60 mg Oral Daily Lisbeth Renshaw, MD   60 mg at 04/04/16 0809  . feeding supplement (GLUCERNA SHAKE) (GLUCERNA SHAKE) liquid 237 mL  237 mL Oral TID BM Loura Halt Ditty, MD   237 mL at 04/04/16 1000  . fluticasone (FLONASE) 50 MCG/ACT nasal spray 1 spray  1 spray Each Nare Daily PRN Lisbeth Renshaw, MD      . hydrALAZINE (APRESOLINE) injection 5-10 mg  5-10 mg Intravenous Q4H PRN Maeola Harman, MD   10 mg at 04/02/16 0748  . HYDROmorphone (DILAUDID) injection 0.5-1 mg   0.5-1 mg Intravenous Q2H PRN Lisbeth Renshaw, MD   0.5 mg at 04/04/16 0528  . insulin aspart (novoLOG) injection 0-9 Units  0-9 Units Subcutaneous Q4H Maeola Harman, MD   1 Units at 04/04/16 0808  . linaclotide Karlene Einstein) capsule 145 mcg  145 mcg Oral Daily Lisbeth Renshaw, MD   145 mcg at 04/04/16 0809  . meclizine (ANTIVERT) tablet 25 mg  25 mg Oral TID PRN Lisbeth Renshaw, MD      . menthol-cetylpyridinium (CEPACOL) lozenge 3 mg  1 lozenge Oral PRN Lisbeth Renshaw, MD       Or  . phenol (CHLORASEPTIC) mouth spray 1 spray  1 spray Mouth/Throat PRN Lisbeth Renshaw, MD   1 spray at 04/03/16 0602  . metFORMIN (GLUCOPHAGE) tablet 500 mg  500 mg Oral BID WC Lisbeth Renshaw, MD   500 mg at 04/04/16 0809  . methocarbamol (ROBAXIN) tablet 500 mg  500 mg Oral Q6H PRN Lisbeth Renshaw, MD   500 mg at 04/04/16 0809   Or  . methocarbamol (ROBAXIN) 500 mg in dextrose 5 % 50 mL IVPB  500 mg Intravenous Q6H PRN Lisbeth Renshaw, MD   500 mg at 04/02/16 0019  . ondansetron (ZOFRAN) injection 4 mg  4 mg Intravenous Q4H PRN Lisbeth Renshaw, MD      . oxyCODONE-acetaminophen (PERCOCET/ROXICET) 5-325 MG per tablet 1-2 tablet  1-2 tablet Oral Q4H PRN Lisbeth Renshaw, MD   1 tablet at 04/04/16 0845  . pantoprazole (PROTONIX) EC tablet 20 mg  20 mg Oral Daily Lisbeth Renshaw, MD   20 mg at 04/04/16 0809  . polyethylene glycol (MIRALAX / GLYCOLAX) packet 17 g  17 g Oral Daily PRN Lisbeth Renshaw, MD      . RESOURCE THICKENUP CLEAR   Oral PRN Lisbeth Renshaw, MD      . rosuvastatin (CRESTOR) tablet 20 mg  20 mg Oral Daily Lisbeth Renshaw, MD   20 mg at 04/04/16 0809  . senna (SENOKOT) tablet 8.6 mg  1 tablet Oral BID Lisbeth Renshaw, MD   8.6 mg at 04/04/16 0809  . sodium chloride flush (NS) 0.9 % injection 3 mL  3 mL Intravenous Q12H Lisbeth Renshaw, MD   3 mL at 04/03/16 2246  . sodium chloride flush (NS) 0.9 % injection 3 mL  3 mL Intravenous PRN Lisbeth Renshaw, MD      . tiZANidine  (ZANAFLEX) tablet 4 mg  4 mg Oral QHS Lisbeth Renshaw, MD   4 mg at 04/03/16 2242  . traZODone (DESYREL) tablet 150 mg  150 mg Oral QHS Lisbeth Renshaw, MD   150 mg at 04/03/16 2254  . ziprasidone (GEODON) capsule 80 mg  80 mg Oral QHS Lisbeth Renshaw, MD   80 mg at 04/03/16 2242  Discharge Medications: Please see discharge summary for a list of discharge medications.  Relevant Imaging Results:  Relevant Lab Results:   Additional Information SSN:  [id number]  Dede QuerySarah Macio Kissoon, LCSW

## 2016-04-04 NOTE — Progress Notes (Signed)
Speech Language Pathology Treatment: Dysphagia  Patient Details Name: [name] MRN: [id number] DOB: 1946-06-28 Today's Date: 04/04/2016 Time: 1350-1402 SLP Time Calculation (min) (ACUTE ONLY): 12 min  Assessment / Plan / Recommendation Clinical Impression  Pt continues to present with dysphagia s/p ACDF, likely due to edema preventing adequate airway closure.  Is tolerating a modified diet (dys 2) with nectar-liquids.  Cough is strong and lungs remain clear per notes, RN report.  Trials of thin liquids continue to elicit overt s/s of aspiration.  Recommend continued current diet, but meds should be crushed given concerns for narrowing of pharyngeal space. SLP will follow for safety and to determine if MBS is necessary prior to D/C to SNF.  D/W RN.    HPI HPI: Robin ConstantWallace D Warfieldis a 70 y.o.woman presenting to the office with progressive weakness and difficulty walking. MRI demonstrated severe spinal cord compression at C3-C7. Treatment options were discussed, and she elected to proceed with surgical decompression and fusion. Pt developed swallow difficulty post op, ST to evaluate swallow function.       SLP Plan  Continue with current plan of care     Recommendations  Diet recommendations: Dysphagia 2 (fine chop);Nectar-thick liquid Liquids provided via: Cup;Teaspoon Medication Administration: Crushed with puree Compensations: Small sips/bites Postural Changes and/or Swallow Maneuvers: Seated upright 90 degrees             Oral Care Recommendations: Oral care BID Plan: Continue with current plan of care     GO             Sadey Yandell L. Samson Fredericouture, [place] CCC/SLP Pager [phone]    Blenda MountsCouture, Ruth Tully Laurice 04/04/2016, 2:08 PM

## 2016-04-05 DIAGNOSIS — E1100 Type 2 diabetes mellitus with hyperosmolarity without nonketotic hyperglycemic-hyperosmolar coma (NKHHC): Secondary | ICD-10-CM | POA: Diagnosis not present

## 2016-04-05 DIAGNOSIS — R4189 Other symptoms and signs involving cognitive functions and awareness: Secondary | ICD-10-CM | POA: Diagnosis not present

## 2016-04-05 DIAGNOSIS — F203 Undifferentiated schizophrenia: Secondary | ICD-10-CM | POA: Diagnosis not present

## 2016-04-05 DIAGNOSIS — R531 Weakness: Secondary | ICD-10-CM | POA: Diagnosis not present

## 2016-04-05 DIAGNOSIS — R262 Difficulty in walking, not elsewhere classified: Secondary | ICD-10-CM | POA: Diagnosis not present

## 2016-04-05 DIAGNOSIS — R42 Dizziness and giddiness: Secondary | ICD-10-CM | POA: Diagnosis not present

## 2016-04-05 DIAGNOSIS — M4712 Other spondylosis with myelopathy, cervical region: Secondary | ICD-10-CM | POA: Diagnosis not present

## 2016-04-05 DIAGNOSIS — E1122 Type 2 diabetes mellitus with diabetic chronic kidney disease: Secondary | ICD-10-CM | POA: Diagnosis not present

## 2016-04-05 DIAGNOSIS — N183 Chronic kidney disease, stage 3 (moderate): Secondary | ICD-10-CM | POA: Diagnosis not present

## 2016-04-05 DIAGNOSIS — G959 Disease of spinal cord, unspecified: Secondary | ICD-10-CM | POA: Diagnosis not present

## 2016-04-05 DIAGNOSIS — Z4789 Encounter for other orthopedic aftercare: Secondary | ICD-10-CM | POA: Diagnosis not present

## 2016-04-05 DIAGNOSIS — G3184 Mild cognitive impairment, so stated: Secondary | ICD-10-CM | POA: Diagnosis not present

## 2016-04-05 DIAGNOSIS — Z981 Arthrodesis status: Secondary | ICD-10-CM | POA: Diagnosis not present

## 2016-04-05 DIAGNOSIS — F200 Paranoid schizophrenia: Secondary | ICD-10-CM | POA: Diagnosis not present

## 2016-04-05 DIAGNOSIS — E785 Hyperlipidemia, unspecified: Secondary | ICD-10-CM | POA: Diagnosis not present

## 2016-04-05 DIAGNOSIS — I10 Essential (primary) hypertension: Secondary | ICD-10-CM | POA: Diagnosis not present

## 2016-04-05 DIAGNOSIS — M6281 Muscle weakness (generalized): Secondary | ICD-10-CM | POA: Diagnosis not present

## 2016-04-05 DIAGNOSIS — F419 Anxiety disorder, unspecified: Secondary | ICD-10-CM | POA: Diagnosis not present

## 2016-04-05 DIAGNOSIS — F339 Major depressive disorder, recurrent, unspecified: Secondary | ICD-10-CM | POA: Diagnosis not present

## 2016-04-05 DIAGNOSIS — M549 Dorsalgia, unspecified: Secondary | ICD-10-CM | POA: Diagnosis not present

## 2016-04-05 DIAGNOSIS — K5904 Chronic idiopathic constipation: Secondary | ICD-10-CM | POA: Diagnosis not present

## 2016-04-05 DIAGNOSIS — R1310 Dysphagia, unspecified: Secondary | ICD-10-CM | POA: Diagnosis not present

## 2016-04-05 DIAGNOSIS — Z79899 Other long term (current) drug therapy: Secondary | ICD-10-CM | POA: Diagnosis not present

## 2016-04-05 DIAGNOSIS — R2681 Unsteadiness on feet: Secondary | ICD-10-CM | POA: Diagnosis not present

## 2016-04-05 DIAGNOSIS — G4700 Insomnia, unspecified: Secondary | ICD-10-CM | POA: Diagnosis not present

## 2016-04-05 LAB — GLUCOSE, CAPILLARY
GLUCOSE-CAPILLARY: 129 mg/dL — AB (ref 65–99)
GLUCOSE-CAPILLARY: 132 mg/dL — AB (ref 65–99)
GLUCOSE-CAPILLARY: 177 mg/dL — AB (ref 65–99)
Glucose-Capillary: 121 mg/dL — ABNORMAL HIGH (ref 65–99)

## 2016-04-05 MED ORDER — GLUCERNA SHAKE PO LIQD: 237.0000 mL | Freq: Three times a day (TID) | ORAL | 0 refills | Status: DC

## 2016-04-05 MED ORDER — METHOCARBAMOL 500 MG PO TABS: 500.0000 mg | ORAL_TABLET | Freq: Four times a day (QID) | ORAL | 0 refills | Status: DC | PRN

## 2016-04-05 MED ORDER — OXYCODONE-ACETAMINOPHEN 5-325 MG PO TABS: 1.0000 | ORAL_TABLET | ORAL | 0 refills | Status: DC | PRN

## 2016-04-05 NOTE — Progress Notes (Signed)
Speech Language Pathology Treatment: Dysphagia  Patient Details Name: [name] MRN: [id number] DOB: Mar 10, 1946 Today's Date: 04/05/2016 Time: [id number] SLP Time Calculation (min) (ACUTE ONLY): 12 min  Assessment / Plan / Recommendation Clinical Impression  Skilled treatment session focused on addressing dysphagia goals. SLP facilitated session by providing set-up assist and Min verbal cues for portion control and pacing with nectar-thick and thin liquids via straw as well as Dys.1 textures.  Skilled observation revealed multiple swallows across tested consistencies with s/s of aspiration only when thin liquids followed Dys.1 textures, likely due to pharyngeal residue.  Note plans for discharge to next level of care soon; recommend to continue with current diet restrictions.  Also recommend the initiation of the [employer] Free Water Protocol as well as upgrade to thins at bedside at next level of care given intact sensation and improvements as post-op edema resolves.      HPI HPI: Robin ConstantWallace D Warfieldis a 70 y.o.woman presenting to the office with progressive weakness and difficulty walking. MRI demonstrated severe spinal cord compression at C3-C7. Treatment options were discussed, and she elected to proceed with surgical decompression and fusion. Pt developed swallow difficulty post op, ST to evaluate swallow function.       SLP Plan  Continue with current plan of care     Recommendations  Diet recommendations: Dysphagia 2 (fine chop);Nectar-thick liquid Liquids provided via: Cup;Straw Medication Administration: Crushed with puree Supervision: Patient able to self feed Compensations: Small sips/bites;Slow rate;Multiple dry swallows after each bite/sip Postural Changes and/or Swallow Maneuvers: Seated upright 90 degrees             Oral Care Recommendations: Oral care BID Follow up Recommendations: Skilled Nursing facility;24 hour supervision/assistance Plan: Continue with current  plan of care     GO              [name], M.A., CCC-SLP [phone]   [name] 04/05/2016, 10:17 AM

## 2016-04-05 NOTE — Progress Notes (Signed)
Pt discharged to Skilled Nursing Facility (SNF) per orders from MD. Pt aware of discharge and family notified. Pt's IV was removed before discharge. RN called to give report to [place], [facility]. Pt exited hospital via stretcher.

## 2016-04-05 NOTE — Clinical Social Work Placement (Signed)
   CLINICAL SOCIAL WORK PLACEMENT  NOTE  Date:  04/05/2016  Patient Details  Name: [name] MRN: [id number] Date of Birth: 12-22-1945  Clinical Social Work is seeking post-discharge placement for this patient at the Skilled  Nursing Facility level of care (*CSW will initial, date and re-position this form in  chart as items are completed):  Yes   Patient/family provided with [facility] Clinical Social Work Department's list of facilities offering this level of care within the geographic area requested by the patient (or if unable, by the patient's family).  Yes   Patient/family informed of their freedom to choose among providers that offer the needed level of care, that participate in Medicare, Medicaid or managed care program needed by the patient, have an available bed and are willing to accept the patient.  Yes   Patient/family informed of [facility]'s ownership interest in [facility] and [facility], as well as of the fact that they are under no obligation to receive care at these facilities.  PASRR submitted to EDS on 04/04/16     PASRR number received on       Existing PASRR number confirmed on       FL2 transmitted to all facilities in geographic area requested by pt/family on 04/04/16     FL2 transmitted to all facilities within larger geographic area on       Patient informed that his/her managed care company has contracts with or will negotiate with certain facilities, including the following:        Yes   Patient/family informed of bed offers received.  [facility]     Physician recommends and patient chooses bed at      [facility] 04[facility].  Patient to be transferred to facility by [facility]AR     Patient family notified on 04/05/16 of transfer.  Name of family member notified:  pt's daughter, [place] SinkRita     PHYSICIAN       Additional Comment:     _______________________________________________ Dede QuerySarah Gayle Collard, LCSW 04/05/2016, 2:57 PM

## 2016-04-05 NOTE — Care Management Note (Signed)
Case Management Note  Patient Details  Name: [name] MRN: [id number] Date of Birth: 04-Nov-1945  Subjective/Objective:                    Action/Plan: Pt discharging [facility] today. No further needs per CM.   Expected Discharge Date:                  Expected Discharge Plan:  Skilled Nursing Facility  In-House Referral:  Clinical Social Work  Discharge planning Services  CM Consult  Post Acute Care Choice:    Choice offered to:     DME Arranged:    DME Agency:     HH Arranged:    HH Agency:     Status of Service:  Completed, signed off  If discussed at [employer] Meetings, dates discussed:    Additional Comments:  Kermit BaloKelli F Compton Brigance, RN 04/05/2016, 2:14 PM

## 2016-04-05 NOTE — Clinical Social Work Note (Signed)
RN Report Information [facility] via [facility] Report# [phone] Room#  602-[place]  [facility]. PASARR has been received. Facility is ready to admit pt as they have received discharge information. Pt and daughter are aware and agreeable to discharge plan. RN to call report. PTAR will provide transportation. CSW is signing off as no further needs identified.   Dede QuerySarah Kyleen Villatoro, MSW, LCSW  Clinical Social Worker  [phone]

## 2016-04-05 NOTE — Progress Notes (Signed)
Physical Therapy Treatment Patient Details Name: [name] MRN: [id number] DOB: 07/19/1946 Today's Date: 04/05/2016    History of Present Illness Pt is a 70 yo female who underwent a ACDF x4 levels for gait instability and LE weakness x 3 years due to cervical cord compression. PMH: schizophrenia, anxiety.    PT Comments    Pt progressing well towards all goals, PT to con't to follow.  Follow Up Recommendations  SNF;Supervision/Assistance - 24 hour     Equipment Recommendations  None recommended by PT    Recommendations for Other Services       Precautions / Restrictions Precautions Precautions: Fall Precaution Comments: cervical precautions-educated pt Required Braces or Orthoses: Cervical Brace Cervical Brace: Hard collar Restrictions Weight Bearing Restrictions: No    Mobility  Bed Mobility Overal bed mobility: Needs Assistance Bed Mobility: Rolling;Sidelying to Sit Rolling: Min guard Sidelying to sit: Min guard       General bed mobility comments: use of bedrail, increased time  Transfers Overall transfer level: Needs assistance Equipment used: Rolling walker (2 wheeled) Transfers: Sit to/from Stand Sit to Stand: Min guard         General transfer comment: v/c's for safe hand placement  Ambulation/Gait Ambulation/Gait assistance: Min assist Ambulation Distance (Feet): 75 Feet Assistive device: Rolling walker (2 wheeled) Gait Pattern/deviations: Step-through pattern   Gait velocity interpretation: Below normal speed for age/gender General Gait Details: pt with decreased step height   Stairs            Wheelchair Mobility    Modified Rankin (Stroke Patients Only)       Balance           Standing balance support: Single extremity supported Standing balance-Leahy Scale: Poor Standing balance comment: pt leaned on counter to wash hands                    Cognition Arousal/Alertness: Awake/alert Behavior During  Therapy: WFL for tasks assessed/performed Overall Cognitive Status: History of cognitive impairments - at baseline                      Exercises      General Comments General comments (skin integrity, edema, etc.): pt assisted to bathroom, pt indep with hygiene      Pertinent Vitals/Pain Pain Assessment: 0-10 Pain Score: 8  Pain Location: neck Pain Intervention(s): Patient requesting pain meds-RN notified    Home Living                      Prior Function            PT Goals (current goals can now be found in the care plan section) Progress towards PT goals: Progressing toward goals    Frequency  Min 5X/week    PT Plan Current plan remains appropriate    Co-evaluation             End of Session Equipment Utilized During Treatment: Gait belt;Cervical collar Activity Tolerance: Patient tolerated treatment well Patient left: in bed;with call bell/phone within reach     Time: 1420-1438 PT Time Calculation (min) (ACUTE ONLY): 18 min  Charges:  $Gait Training: 8-22 mins                    G Codes:      [name], [name] 04/05/2016, 5:14 PM   [name], PT, DPT Pager #: [phone] Office #: [phone]

## 2016-04-05 NOTE — Discharge Summary (Signed)
   Physician Discharge Summary  Patient ID: [name] MRN: [id number] DOB/AGE: 70-Jul-1947 70 y.o.  Admit date: 04/01/2016 Discharge date: 04/05/2016  Admission Diagnoses: Cervical spondylosis with myelopathy, C3-4, 4-5, 5-6, 6-7  Discharge Diagnoses: Same Active Problems:   Cervical spondylosis with myelopathy   Discharged Condition: Stable  Hospital Course:  [name] is a 70 y.o. female electively admitted after multilevel cervical decompression/fusion for myelopathy, initially presenting with difficulty walking, imbalance, and arm weakness. Postoperatively, she was at baseline, and admitted to the floor. She had mild dysphagia expected after surgery and was seen by SLP. She was ambulating well with PT with assistance, much better than preop. SNF placement was recommended and she was discharged in stable condition.  Treatments: Surgery - C3-4, C4-5 ACDF with C6 corpectomy  Discharge Exam: Blood pressure 122/65, pulse 74, temperature 98.9 F (37.2 C), temperature source Oral, resp. rate 18, height _0  (1.626 m), weight 57.2 kg (125 lb 15.9 oz), SpO2 98 %. Awake, alert, oriented Speech fluent, appropriate CN grossly intact 4/5 distal BUE, good BLE strength Wound c/d/i  Disposition: SNF     Medication List    TAKE these medications   albuterol (2.5 MG/3ML) 0.083% nebulizer solution Commonly known as:  PROVENTIL Take 2.5 mg by nebulization every 6 (six) hours as needed for wheezing or shortness of breath.   amLODipine-benazepril 5-10 MG capsule Commonly known as:  LOTREL Take 1 capsule by mouth at bedtime.   aspirin EC 81 MG tablet Take 81 mg by mouth daily.   B-12 1000 MCG/ML Kit Inject 1,000 mcg as directed every 30 (thirty) days.   DULoxetine 60 MG capsule Commonly known as:  CYMBALTA Take 60 mg by mouth daily.   feeding supplement (GLUCERNA SHAKE) Liqd Take 237 mLs by mouth 3 (three) times daily between meals.   fluticasone 50 MCG/ACT  nasal spray Commonly known as:  FLONASE Place 1 spray into both nostrils daily as needed for allergies.   LINZESS 145 MCG Caps capsule Generic drug:  linaclotide Take 145 mcg by mouth daily.   meclizine 25 MG tablet Commonly known as:  ANTIVERT Take 25 mg by mouth 3 (three) times daily as needed for dizziness.   metFORMIN 500 MG tablet Commonly known as:  GLUCOPHAGE Take 500 mg by mouth 2 (two) times daily with a meal.   methocarbamol 500 MG tablet Commonly known as:  ROBAXIN Take 1 tablet (500 mg total) by mouth every 6 (six) hours as needed for muscle spasms.   oxyCODONE-acetaminophen 5-325 MG tablet Commonly known as:  PERCOCET/ROXICET Take 1-2 tablets by mouth every 4 (four) hours as needed for moderate pain.   rosuvastatin 20 MG tablet Commonly known as:  CRESTOR Take 20 mg by mouth daily.   tiZANidine 4 MG tablet Commonly known as:  ZANAFLEX Take 4 mg by mouth at bedtime.   traZODone 150 MG tablet Commonly known as:  DESYREL Take 150 mg by mouth at bedtime.   ziprasidone 80 MG capsule Commonly known as:  GEODON Take 80 mg by mouth at bedtime.      Follow-up Information    Tabor Bartram, C, MD Follow up in 3 week(s).   Specialty:  Neurosurgery Contact information: [address] [place] [address] [phone]           Signed: Consuella Lose, Loletha Grayer 04/05/2016, 1:56 PM

## 2016-04-06 ENCOUNTER — Encounter: Payer: Self-pay | Admitting: Internal Medicine

## 2016-04-06 ENCOUNTER — Non-Acute Institutional Stay (SKILLED_NURSING_FACILITY): Payer: Medicare Other | Admitting: Internal Medicine

## 2016-04-06 DIAGNOSIS — E1122 Type 2 diabetes mellitus with diabetic chronic kidney disease: Secondary | ICD-10-CM

## 2016-04-06 DIAGNOSIS — E46 Unspecified protein-calorie malnutrition: Secondary | ICD-10-CM

## 2016-04-06 DIAGNOSIS — R4189 Other symptoms and signs involving cognitive functions and awareness: Secondary | ICD-10-CM | POA: Diagnosis not present

## 2016-04-06 DIAGNOSIS — I10 Essential (primary) hypertension: Secondary | ICD-10-CM

## 2016-04-06 DIAGNOSIS — R42 Dizziness and giddiness: Secondary | ICD-10-CM

## 2016-04-06 DIAGNOSIS — F200 Paranoid schizophrenia: Secondary | ICD-10-CM

## 2016-04-06 DIAGNOSIS — E785 Hyperlipidemia, unspecified: Secondary | ICD-10-CM | POA: Diagnosis not present

## 2016-04-06 DIAGNOSIS — K5904 Chronic idiopathic constipation: Secondary | ICD-10-CM | POA: Diagnosis not present

## 2016-04-06 DIAGNOSIS — N183 Chronic kidney disease, stage 3 unspecified: Secondary | ICD-10-CM

## 2016-04-06 DIAGNOSIS — R1310 Dysphagia, unspecified: Secondary | ICD-10-CM | POA: Diagnosis not present

## 2016-04-06 DIAGNOSIS — G4700 Insomnia, unspecified: Secondary | ICD-10-CM | POA: Diagnosis not present

## 2016-04-06 DIAGNOSIS — M4712 Other spondylosis with myelopathy, cervical region: Secondary | ICD-10-CM | POA: Diagnosis not present

## 2016-04-06 DIAGNOSIS — I69922 Dysarthria following unspecified cerebrovascular disease: Secondary | ICD-10-CM

## 2016-04-06 DIAGNOSIS — R531 Weakness: Secondary | ICD-10-CM

## 2016-04-06 DIAGNOSIS — IMO0002 Reserved for concepts with insufficient information to code with codable children: Secondary | ICD-10-CM

## 2016-04-06 NOTE — Progress Notes (Signed)
LOCATION: [facility]  PCP: Lujean Amel, MD   Code Status: Full Code  Goals of care: Advanced Directive information Advanced Directives 04/02/2016  Does patient have an advance directive? No  Would patient like information on creating an advanced directive? Yes - Scientist, clinical (histocompatibility and immunogenetics) given       Extended Emergency Contact Information Primary Emergency Contact: Minnifee,[name] Address: [address]          [place], [place] [address] [place] Phone: [phone] Relation: Daughter Secondary Emergency Contact: Reed,Gina Address: [address]          [place], [place] [address] [place] Phone: [phone] Relation: Daughter   Allergies  Allergen Reactions  . No Known Allergies     Chief Complaint  Patient presents with  . New Admit To SNF    New Admission     HPI:  Patient is a 70 y.o. female seen today for short term rehabilitation post hospital admission from 04/01/16-04/05/16 with cervical spondylosis with myelopathy c3-4, c4-5, c5-6, c6-7. She underwent c3-4 and c4-5 decompression and fusion with C6 corpectomy. She is seen in her room today. She has PMH of DM, HTN, HLD, stroke with residual dysarthria and dysphagia, paranoid schizophrenia among others.   Review of Systems:  Constitutional: Negative for fever, chills, diaphoresis. Feels weak and tired. HENT: Negative for headache, congestion, nasal discharge. Positive for difficulty swallowing. Some throat discomfort.   Eyes: Negative for blurred vision, double vision and discharge.  Respiratory: Negative for cough, shortness of breath and wheezing.   Cardiovascular: Negative for chest pain, palpitations, leg swelling.  Gastrointestinal: Negative for heartburn, nausea, vomiting, abdominal pain. Last bowel movement was yesterday.  Genitourinary: Negative for dysuria and flank pain.  Musculoskeletal: Negative for fall in the facility. she has weakness to her upper extremity but  has noticed improvement since surgery.  Skin: Negative for itching, rash.  Neurological: Positive for dizziness. Psychiatric/Behavioral: Negative for depression   Past Medical History:  Diagnosis Date  . Anxiety   . Cervical spondylosis with myelopathy   . Complication of anesthesia    hard time waking up with last procedure years ago  . COPD (chronic obstructive pulmonary disease) ([place])   . Depression   . Diabetes mellitus without complication ([place])   . Diabetic neuropathy ([place])   . Headache   . Hypertension   . Memory deficit   . Pancreatitis   . Schizophrenia ([place])   . Stroke ([place])   . Wears dentures   . Wears glasses    Past Surgical History:  Procedure Laterality Date  . ANTERIOR CERVICAL DECOMPRESSION/DISCECTOMY FUSION 4 LEVELS N/A 04/01/2016   Procedure: ANTERIOR CERVICAL DECOMPRESSION/DISCECTOMY FUSION CERVICAL THREE- CERVICAL FOUR, CERVICAL FOUR-CERVICAL FIVE, CORPECTOMY C6;  Surgeon: Consuella Lose, MD;  Location: [facility];  Service: Neurosurgery;  Laterality: N/A;  ANTERIOR CERVICAL DECOMPRESSION/DISCECTOMY FUSION C3-C4, C4-C5,C5-C6,C6-C7  . FOOT SURGERY    . MULTIPLE TOOTH EXTRACTIONS    . RHINOPLASTY    . ROTATOR CUFF REPAIR    . TONSILLECTOMY    . TUBAL LIGATION     Social History:   reports that she has quit smoking. Her smoking use included Cigarettes. She has never used smokeless tobacco. She reports that she does not drink alcohol or use drugs.  No family history on file.  Medications:   Medication List       Accurate as of 04/06/16 12:54 PM. Always use your most recent med list.  albuterol (2.5 MG/3ML) 0.083% nebulizer solution Commonly known as:  PROVENTIL Take 2.5 mg by nebulization every 6 (six) hours as needed for wheezing or shortness of breath.   amLODipine-benazepril 5-10 MG capsule Commonly known as:  LOTREL Take 1 capsule by mouth at bedtime.   aspirin EC 81 MG tablet Take 81 mg by mouth daily.   B-12 1000 MCG/ML  Kit Inject 1,000 mcg as directed every 30 (thirty) days.   DULoxetine 60 MG capsule Commonly known as:  CYMBALTA Take 60 mg by mouth daily.   fluticasone 50 MCG/ACT nasal spray Commonly known as:  FLONASE Place 1 spray into both nostrils daily as needed for allergies.   LINZESS 145 MCG Caps capsule Generic drug:  linaclotide Take 145 mcg by mouth daily.   meclizine 25 MG tablet Commonly known as:  ANTIVERT Take 25 mg by mouth 3 (three) times daily as needed for dizziness.   metFORMIN 500 MG tablet Commonly known as:  GLUCOPHAGE Take 500 mg by mouth 2 (two) times daily with a meal.   methocarbamol 500 MG tablet Commonly known as:  ROBAXIN Take 1 tablet (500 mg total) by mouth every 6 (six) hours as needed for muscle spasms.   oxyCODONE-acetaminophen 5-325 MG tablet Commonly known as:  PERCOCET/ROXICET Take 1-2 tablets by mouth every 4 (four) hours as needed for moderate pain.   rosuvastatin 20 MG tablet Commonly known as:  CRESTOR Take 20 mg by mouth daily.   tiZANidine 4 MG tablet Commonly known as:  ZANAFLEX Take 4 mg by mouth at bedtime.   traZODone 150 MG tablet Commonly known as:  DESYREL Take 150 mg by mouth at bedtime.   UNABLE TO FIND Med Name: Med pass 120 cc by mouth 3 times daily   ziprasidone 80 MG capsule Commonly known as:  GEODON Take 80 mg by mouth at bedtime.       Immunizations:  There is no immunization history on file for this patient.   Physical Exam:  Vitals:   04/06/16 1249  BP: 136/80  Pulse: 78  Resp: 18  Temp: 98.3 F (36.8 C)  TempSrc: Oral  SpO2: 96%  Weight: 125 lb (56.7 kg)  Height: 5' 4"  (1.626 m)   Body mass index is 21.46 kg/m.  General- elderly female, well built, in no acute distress Head- normocephalic, atraumatic Nose- no maxillary or frontal sinus tenderness, no nasal discharge Throat- moist mucus membrane Eyes- PERRLA, EOMI, no pallor, no icterus Neck- no cervical lymphadenopathy, cervical collar  present Cardiovascular- normal s1,s2, no murmur, no leg edema Respiratory- bilateral clear to auscultation, no wheeze, no rhonchi, no crackles, no use of accessory muscles Abdomen- bowel sounds present, soft, non tender Musculoskeletal- able to move all 4 extremities, upper extremity strength 4/5 and lower extremity 5/5 Neurological- alert and oriented to person but not to place and time, dysarthria present Skin- warm and dry Psychiatry- normal mood and affect    Labs reviewed: Basic Metabolic Panel:  Recent Labs  03/25/16 1003  NA 139  K 4.0  CL 108  CO2 20*  GLUCOSE 137*  BUN 10  CREATININE 1.07*  CALCIUM 10.5*   Liver Function Tests: No results for input(s): AST, ALT, ALKPHOS, BILITOT, PROT, ALBUMIN in the last 8760 hours. No results for input(s): LIPASE, AMYLASE in the last 8760 hours. No results for input(s): AMMONIA in the last 8760 hours. CBC:  Recent Labs  03/25/16 1003  WBC 8.7  HGB 12.9  HCT 39.6  MCV 87.0  PLT 165  Cardiac Enzymes: No results for input(s): CKTOTAL, CKMB, CKMBINDEX, TROPONINI in the last 8760 hours. BNP: Invalid input(s): POCBNP CBG:  Recent Labs  04/05/16 0444 04/05/16 0731 04/05/16 1211  GLUCAP 129* 132* 177*    Radiological Exams: Dg Cervical Spine 2-3 Views  Result Date: 04/01/2016 CLINICAL DATA:  C3-C7 ACDF. EXAM: DG C-ARM 61-120 MIN; CERVICAL SPINE - 2-3 VIEW COMPARISON:  03/04/2016 cervical spine MRI. FINDINGS: Fluoroscopy time 0 minutes 8 seconds. Two spot fluoroscopic nondiagnostic intraoperative cervical spine radiographs demonstrate postsurgical changes from C3-C7 ACDF. Tubes and wires overlie the anterior neck. IMPRESSION: Intraoperative fluoroscopic guidance for C3-C7 ACDF. Electronically Signed   By: Ilona Sorrel M.D.   On: 04/01/2016 15:23   Dg C-arm 1-60 Min  Result Date: 04/01/2016 CLINICAL DATA:  C3-C7 ACDF. EXAM: DG C-ARM 61-120 MIN; CERVICAL SPINE - 2-3 VIEW COMPARISON:  03/04/2016 cervical spine MRI.  FINDINGS: Fluoroscopy time 0 minutes 8 seconds. Two spot fluoroscopic nondiagnostic intraoperative cervical spine radiographs demonstrate postsurgical changes from C3-C7 ACDF. Tubes and wires overlie the anterior neck. IMPRESSION: Intraoperative fluoroscopic guidance for C3-C7 ACDF. Electronically Signed   By: Ilona Sorrel M.D.   On: 04/01/2016 15:23    Assessment/Plan  Generalized weakness Will have her work with physical therapy and occupational therapy team to help with gait training and muscle strengthening exercises.fall precautions. Skin care. Encourage to be out of bed.   Cervical spondylosis with myelopathy S/p cervical decompression and fusion. Has follow up with neurosurgery. Continue to wear her neck collar. Continue percocet 5-325 mg 1-2 tab q4h prn pain with robaxin 500 mg q6h prn muscle spasm. Continue zanaflex at bedtime. Will have patient work with PT/OT as tolerated to regain strength and restore function.  Fall precautions are in place.  Dysphagia Get SLP to evaluate, aspiration precautions  Dm type 2 Lab Results  Component Value Date   HGBA1C 7.6 (H) 03/25/2016   Monitor cbg. Continue metformin 500 mg bid  Dizziness Occasionally with position change, continue meclizine on need basis and monitor BP/ HR  Hypertension Monitor bp, continue amlodipine- benazepril current regimen and check bmp  Hyperlipidemia Continue crestor  Cognitive impairment Alert and oriented to person only. Provide supportive care for now. check tsh and b12. Will need further workup with possible neuropsych evaluation as outpatient. SLP to evaluate  Protein calorie malnutrition Get RD to evaluate, monitor po intake and continue protein supplement  Insomnia Continue trazodone  Chronic idiopathic constipation Continue her linzess  Paranoid schizophrenia Continue ziprasidone 80 mg qhs with duloxetine 60 mg daily, no changes made, monitor mood, psych consult  ckd stage 3 Monitor  bmp  Old cva with dysarthria Continue baby aspirin daily with her BP meds.    Goals of care: short term rehabilitation   Labs/tests ordered: cbc, cmp, tsh, b12   Family/ staff Communication: reviewed care plan with patient and nursing supervisor    Blanchie Serve, MD Internal Medicine [place], [place] [address] Cell Phone (Monday-Friday 8 am - 5 pm): [phone] On Call: [phone] and follow prompts after 5 pm and on weekends Office Phone: [phone] Office Fax: [phone]

## 2016-04-29 ENCOUNTER — Non-Acute Institutional Stay (SKILLED_NURSING_FACILITY): Payer: Medicare Other | Admitting: Nurse Practitioner

## 2016-04-29 ENCOUNTER — Encounter: Payer: Self-pay | Admitting: Nurse Practitioner

## 2016-04-29 DIAGNOSIS — F203 Undifferentiated schizophrenia: Secondary | ICD-10-CM | POA: Diagnosis not present

## 2016-04-29 DIAGNOSIS — G959 Disease of spinal cord, unspecified: Secondary | ICD-10-CM

## 2016-04-29 DIAGNOSIS — E119 Type 2 diabetes mellitus without complications: Secondary | ICD-10-CM | POA: Insufficient documentation

## 2016-04-29 DIAGNOSIS — E1100 Type 2 diabetes mellitus with hyperosmolarity without nonketotic hyperglycemic-hyperosmolar coma (NKHHC): Secondary | ICD-10-CM

## 2016-04-29 DIAGNOSIS — M4712 Other spondylosis with myelopathy, cervical region: Secondary | ICD-10-CM

## 2016-04-29 DIAGNOSIS — F209 Schizophrenia, unspecified: Secondary | ICD-10-CM | POA: Insufficient documentation

## 2016-04-29 NOTE — Assessment & Plan Note (Signed)
Continue cervical collar, prn Meclizine, Robaxin, Norco available to her.

## 2016-04-29 NOTE — Assessment & Plan Note (Signed)
Continue Metformin 500mg  bid, monitor blood sugar

## 2016-04-29 NOTE — Assessment & Plan Note (Signed)
Continue cervical collar, f/u Ortho, continue prn Meclizine, Robaxin, Norco for pain, dizziness, or nausea.

## 2016-04-29 NOTE — Assessment & Plan Note (Addendum)
Stable, continue Geodon 80mg  qd. Trazodone 150mg  daily.

## 2016-04-29 NOTE — Progress Notes (Signed)
[facility] Room Number: 602-P Place of Service:  SNF (31)  Provider: Chipper OmanMast, Manxie  NP  PCP: Darrow BussingKOIRALA,DIBAS, MD Patient Care Team: Darrow Bussingibas Koirala, MD as PCP - General (Family Medicine)  Extended Emergency Contact Information Primary Emergency Contact: Minnifee,[name] Address: [address]          [place], [place] [address] [place] Home Phone: [phone] Relation: Daughter Secondary Emergency Contact: Reed,Gina Address: [address]          [place], [place] [address] [place] Home Phone: [phone] Relation: Daughter  Code Status: Full Code  Goals of care:  Advanced Directive information Advanced Directives 04/02/2016  Does patient have an advance directive? No  Would patient like information on creating an advanced directive? Yes - Educational materials given     Allergies  Allergen Reactions  . No Known Allergies     Chief Complaint  Patient presents with  . Discharge Note    HPI:  70 y.o. female  Here for Rehab for generalized weakness and cervical spondylosis following hospitalization 04/01/16-04/05/16 with cervical spondylosis with myelopathy c3-4, c4-5, c5-6, c6-7. She underwent c3-4 and c4-5 decompression and fusion with C6 corpectomy. Cervical collar is in place. She is stable to discharge home.      Hx of DM, HTN, HLD, stroke with residual dysarthria and dysphagia, paranoid schizophrenia   Past Medical History:  Diagnosis Date  . Anxiety   . Cervical spondylosis with myelopathy   . Complication of anesthesia    hard time waking up with last procedure years ago  . COPD (chronic obstructive pulmonary disease) ([facility])   . Depression   . Diabetes mellitus without complication ([facility])   . Diabetic neuropathy ([facility])   . Headache   . Hypertension   . Memory deficit   . Pancreatitis   . Schizophrenia ([facility])   . Stroke ([facility])   . Wears dentures   . Wears glasses     Past Surgical History:    Procedure Laterality Date  . ANTERIOR CERVICAL DECOMPRESSION/DISCECTOMY FUSION 4 LEVELS N/A 04/01/2016   Procedure: ANTERIOR CERVICAL DECOMPRESSION/DISCECTOMY FUSION CERVICAL THREE- CERVICAL FOUR, CERVICAL FOUR-CERVICAL FIVE, CORPECTOMY C6;  Surgeon: Lisbeth RenshawNeelesh Nundkumar, MD;  Location: [facility];  Service: Neurosurgery;  Laterality: N/A;  ANTERIOR CERVICAL DECOMPRESSION/DISCECTOMY FUSION C3-C4, C4-C5,C5-C6,C6-C7  . FOOT SURGERY    . MULTIPLE TOOTH EXTRACTIONS    . RHINOPLASTY    . ROTATOR CUFF REPAIR    . TONSILLECTOMY    . TUBAL LIGATION        reports that she has quit smoking. Her smoking use included Cigarettes. She has never used smokeless tobacco. She reports that she does not drink alcohol or use drugs. Social History   Social History  . Marital status: Widowed    Spouse name: N/A  . Number of children: N/A  . Years of education: N/A   Occupational History  . Not on file.   Social History Main Topics  . Smoking status: Former Smoker    Types: Cigarettes  . Smokeless tobacco: Never Used     Comment: in 2013  . Alcohol use No  . Drug use: No  . Sexual activity: Not on file   Other Topics Concern  . Not on file   Social History Narrative   Lives with 2 sisters in a one story home.  Retired from [employer] work.  Education: GED    Functional Status Survey:    Allergies  Allergen  Reactions  . No Known Allergies     Pertinent  Health Maintenance Due  Topic Date Due  . FOOT EXAM  04/17/1956  . OPHTHALMOLOGY EXAM  04/17/1956  . MAMMOGRAM  04/17/1996  . COLONOSCOPY  04/17/1996  . DEXA SCAN  04/18/2011  . PNA vac Low Risk Adult (1 of 2 - PCV13) 04/18/2011  . INFLUENZA VACCINE  03/01/2016  . HEMOGLOBIN A1C  09/25/2016    Medications:   Medication List       Accurate as of 04/29/16  5:31 PM. Always use your most recent med list.          albuterol (2.5 MG/3ML) 0.083% nebulizer solution Commonly known as:  PROVENTIL Take 2.5 mg by nebulization every 6  (six) hours as needed for wheezing or shortness of breath.   aspirin EC 81 MG tablet Take 81 mg by mouth daily.   meclizine 25 MG tablet Commonly known as:  ANTIVERT Take 25 mg by mouth 3 (three) times daily as needed for dizziness.   metFORMIN 500 MG tablet Commonly known as:  GLUCOPHAGE Take 500 mg by mouth 2 (two) times daily with a meal.   methocarbamol 500 MG tablet Commonly known as:  ROBAXIN Take 1 tablet (500 mg total) by mouth every 6 (six) hours as needed for muscle spasms.   oxyCODONE-acetaminophen 5-325 MG tablet Commonly known as:  PERCOCET/ROXICET Take 1-2 tablets by mouth every 4 (four) hours as needed for moderate pain.   traZODone 150 MG tablet Commonly known as:  DESYREL Take 150 mg by mouth at bedtime.   UNABLE TO FIND Med Name: Med pass 120 cc by mouth 3 times daily   ziprasidone 80 MG capsule Commonly known as:  GEODON Take 80 mg by mouth at bedtime.       Review of Systems  Constitutional: Positive for fatigue. Negative for activity change, appetite change, chills, diaphoresis and fever.  HENT: Positive for hearing loss. Negative for congestion, drooling, ear discharge, facial swelling, mouth sores and nosebleeds.   Eyes: Negative for photophobia, pain, discharge, redness and itching.  Respiratory: Negative for cough, choking, chest tightness, shortness of breath and wheezing.   Cardiovascular: Negative for chest pain, palpitations and leg swelling.  Gastrointestinal: Negative for abdominal distention, abdominal pain, blood in stool, constipation, diarrhea, nausea and vomiting.  Endocrine: Negative for cold intolerance, heat intolerance, polydipsia, polyphagia and polyuria.  Genitourinary: Negative for difficulty urinating, dysuria, flank pain, frequency, hematuria and urgency.  Musculoskeletal: Positive for gait problem, myalgias, neck pain and neck stiffness. Negative for arthralgias and back pain.  Skin: Negative for color change, pallor, rash and  wound.  Allergic/Immunologic: Negative.   Neurological: Positive for speech difficulty. Negative for dizziness, tremors, seizures, syncope, facial asymmetry, weakness, light-headedness, numbness and headaches.  Hematological: Negative.   Psychiatric/Behavioral: Negative for agitation, behavioral problems, confusion, decreased concentration, hallucinations, self-injury, sleep disturbance and suicidal ideas. The patient is not nervous/anxious and is not hyperactive.     Vitals:   04/29/16 1612  BP: 127/82  Pulse: 70  Resp: 20  Temp: 98 F (36.7 C)  SpO2: 96%  Weight: 125 lb (56.7 kg)  Height: 5\' 4"  (1.626 m)   Body mass index is 21.46 kg/m. Physical Exam  Labs reviewed: Basic Metabolic Panel:  Recent Labs  95/62/13 1003  NA 139  K 4.0  CL 108  CO2 20*  GLUCOSE 137*  BUN 10  CREATININE 1.07*  CALCIUM 10.5*   Liver Function Tests: No results for input(s): AST, ALT, ALKPHOS,  BILITOT, PROT, ALBUMIN in the last 8760 hours. No results for input(s): LIPASE, AMYLASE in the last 8760 hours. No results for input(s): AMMONIA in the last 8760 hours. CBC:  Recent Labs  03/25/16 1003  WBC 8.7  HGB 12.9  HCT 39.6  MCV 87.0  PLT 165   Cardiac Enzymes: No results for input(s): CKTOTAL, CKMB, CKMBINDEX, TROPONINI in the last 8760 hours. BNP: Invalid input(s): POCBNP CBG:  Recent Labs  04/05/16 0444 04/05/16 0731 04/05/16 1211  GLUCAP 129* 132* 177*    Procedures and Imaging Studies During Stay: Dg Cervical Spine 2-3 Views  Result Date: 04/01/2016 CLINICAL DATA:  C3-C7 ACDF. EXAM: DG C-ARM 61-120 MIN; CERVICAL SPINE - 2-3 VIEW COMPARISON:  03/04/2016 cervical spine MRI. FINDINGS: Fluoroscopy time 0 minutes 8 seconds. Two spot fluoroscopic nondiagnostic intraoperative cervical spine radiographs demonstrate postsurgical changes from C3-C7 ACDF. Tubes and wires overlie the anterior neck. IMPRESSION: Intraoperative fluoroscopic guidance for C3-C7 ACDF. Electronically Signed    By: Delbert Phenix M.D.   On: 04/01/2016 15:23   Dg C-arm 1-60 Min  Result Date: 04/01/2016 CLINICAL DATA:  C3-C7 ACDF. EXAM: DG C-ARM 61-120 MIN; CERVICAL SPINE - 2-3 VIEW COMPARISON:  03/04/2016 cervical spine MRI. FINDINGS: Fluoroscopy time 0 minutes 8 seconds. Two spot fluoroscopic nondiagnostic intraoperative cervical spine radiographs demonstrate postsurgical changes from C3-C7 ACDF. Tubes and wires overlie the anterior neck. IMPRESSION: Intraoperative fluoroscopic guidance for C3-C7 ACDF. Electronically Signed   By: Delbert Phenix M.D.   On: 04/01/2016 15:23    Assessment/Plan:   Myelopathy ([facility]) Continue cervical collar, prn Meclizine, Robaxin, Norco available to her.   Cervical spondylosis with myelopathy Continue cervical collar, f/u Ortho, continue prn Meclizine, Robaxin, Norco for pain, dizziness, or nausea.   Schizophrenia ([facility]) Stable, continue Geodon 80mg  qd. Trazodone 150mg  daily.   Type 2 diabetes mellitus ([facility]) Continue Metformin 500mg  bid, monitor blood sugar    Patient is being discharged with the following home health services:    Patient is being discharged with the following durable medical equipment:    Patient has been advised to f/u with their PCP in 1-2 weeks to for a transitions of care visit.  Social services at their facility was responsible for arranging this appointment.  Pt was provided with adequate prescriptions of noncontrolled medications to reach the scheduled appointment .  For controlled substances, a limited supply was provided as appropriate for the individual patient.  If the pt normally receives these medications from a pain clinic or has a contract with another physician, these medications should be received from that clinic or physician only).    Future labs/tests needed:  None.

## 2016-05-01 DIAGNOSIS — Z981 Arthrodesis status: Secondary | ICD-10-CM | POA: Diagnosis not present

## 2016-05-01 DIAGNOSIS — R1312 Dysphagia, oropharyngeal phase: Secondary | ICD-10-CM | POA: Diagnosis not present

## 2016-05-01 DIAGNOSIS — R262 Difficulty in walking, not elsewhere classified: Secondary | ICD-10-CM | POA: Diagnosis not present

## 2016-05-01 DIAGNOSIS — M4712 Other spondylosis with myelopathy, cervical region: Secondary | ICD-10-CM | POA: Diagnosis not present

## 2016-05-01 DIAGNOSIS — Z4789 Encounter for other orthopedic aftercare: Secondary | ICD-10-CM | POA: Diagnosis not present

## 2016-05-01 DIAGNOSIS — R2681 Unsteadiness on feet: Secondary | ICD-10-CM | POA: Diagnosis not present

## 2016-05-01 DIAGNOSIS — M6281 Muscle weakness (generalized): Secondary | ICD-10-CM | POA: Diagnosis not present

## 2016-05-02 DIAGNOSIS — M4712 Other spondylosis with myelopathy, cervical region: Secondary | ICD-10-CM | POA: Diagnosis not present

## 2016-05-08 DIAGNOSIS — Z4789 Encounter for other orthopedic aftercare: Secondary | ICD-10-CM | POA: Diagnosis not present

## 2016-05-08 DIAGNOSIS — I69391 Dysphagia following cerebral infarction: Secondary | ICD-10-CM | POA: Diagnosis not present

## 2016-05-08 DIAGNOSIS — E1140 Type 2 diabetes mellitus with diabetic neuropathy, unspecified: Secondary | ICD-10-CM | POA: Diagnosis not present

## 2016-05-08 DIAGNOSIS — R262 Difficulty in walking, not elsewhere classified: Secondary | ICD-10-CM | POA: Diagnosis not present

## 2016-05-08 DIAGNOSIS — J449 Chronic obstructive pulmonary disease, unspecified: Secondary | ICD-10-CM | POA: Diagnosis not present

## 2016-05-08 DIAGNOSIS — Z7984 Long term (current) use of oral hypoglycemic drugs: Secondary | ICD-10-CM | POA: Diagnosis not present

## 2016-05-08 DIAGNOSIS — R1313 Dysphagia, pharyngeal phase: Secondary | ICD-10-CM | POA: Diagnosis not present

## 2016-05-10 DIAGNOSIS — I69391 Dysphagia following cerebral infarction: Secondary | ICD-10-CM | POA: Diagnosis not present

## 2016-05-10 DIAGNOSIS — Z4789 Encounter for other orthopedic aftercare: Secondary | ICD-10-CM | POA: Diagnosis not present

## 2016-05-10 DIAGNOSIS — J449 Chronic obstructive pulmonary disease, unspecified: Secondary | ICD-10-CM | POA: Diagnosis not present

## 2016-05-10 DIAGNOSIS — R1313 Dysphagia, pharyngeal phase: Secondary | ICD-10-CM | POA: Diagnosis not present

## 2016-05-10 DIAGNOSIS — R262 Difficulty in walking, not elsewhere classified: Secondary | ICD-10-CM | POA: Diagnosis not present

## 2016-05-10 DIAGNOSIS — E1140 Type 2 diabetes mellitus with diabetic neuropathy, unspecified: Secondary | ICD-10-CM | POA: Diagnosis not present

## 2016-05-11 DIAGNOSIS — I69391 Dysphagia following cerebral infarction: Secondary | ICD-10-CM | POA: Diagnosis not present

## 2016-05-11 DIAGNOSIS — R1313 Dysphagia, pharyngeal phase: Secondary | ICD-10-CM | POA: Diagnosis not present

## 2016-05-11 DIAGNOSIS — Z4789 Encounter for other orthopedic aftercare: Secondary | ICD-10-CM | POA: Diagnosis not present

## 2016-05-11 DIAGNOSIS — E1140 Type 2 diabetes mellitus with diabetic neuropathy, unspecified: Secondary | ICD-10-CM | POA: Diagnosis not present

## 2016-05-11 DIAGNOSIS — J449 Chronic obstructive pulmonary disease, unspecified: Secondary | ICD-10-CM | POA: Diagnosis not present

## 2016-05-11 DIAGNOSIS — R262 Difficulty in walking, not elsewhere classified: Secondary | ICD-10-CM | POA: Diagnosis not present

## 2016-05-12 DIAGNOSIS — R262 Difficulty in walking, not elsewhere classified: Secondary | ICD-10-CM | POA: Diagnosis not present

## 2016-05-12 DIAGNOSIS — I69391 Dysphagia following cerebral infarction: Secondary | ICD-10-CM | POA: Diagnosis not present

## 2016-05-12 DIAGNOSIS — Z4789 Encounter for other orthopedic aftercare: Secondary | ICD-10-CM | POA: Diagnosis not present

## 2016-05-12 DIAGNOSIS — E1140 Type 2 diabetes mellitus with diabetic neuropathy, unspecified: Secondary | ICD-10-CM | POA: Diagnosis not present

## 2016-05-12 DIAGNOSIS — R1313 Dysphagia, pharyngeal phase: Secondary | ICD-10-CM | POA: Diagnosis not present

## 2016-05-12 DIAGNOSIS — J449 Chronic obstructive pulmonary disease, unspecified: Secondary | ICD-10-CM | POA: Diagnosis not present

## 2016-05-13 DIAGNOSIS — R262 Difficulty in walking, not elsewhere classified: Secondary | ICD-10-CM | POA: Diagnosis not present

## 2016-05-13 DIAGNOSIS — E1140 Type 2 diabetes mellitus with diabetic neuropathy, unspecified: Secondary | ICD-10-CM | POA: Diagnosis not present

## 2016-05-13 DIAGNOSIS — Z4789 Encounter for other orthopedic aftercare: Secondary | ICD-10-CM | POA: Diagnosis not present

## 2016-05-13 DIAGNOSIS — R1313 Dysphagia, pharyngeal phase: Secondary | ICD-10-CM | POA: Diagnosis not present

## 2016-05-13 DIAGNOSIS — I69391 Dysphagia following cerebral infarction: Secondary | ICD-10-CM | POA: Diagnosis not present

## 2016-05-13 DIAGNOSIS — J449 Chronic obstructive pulmonary disease, unspecified: Secondary | ICD-10-CM | POA: Diagnosis not present

## 2016-05-17 DIAGNOSIS — E1140 Type 2 diabetes mellitus with diabetic neuropathy, unspecified: Secondary | ICD-10-CM | POA: Diagnosis not present

## 2016-05-17 DIAGNOSIS — Z4789 Encounter for other orthopedic aftercare: Secondary | ICD-10-CM | POA: Diagnosis not present

## 2016-05-17 DIAGNOSIS — R1313 Dysphagia, pharyngeal phase: Secondary | ICD-10-CM | POA: Diagnosis not present

## 2016-05-17 DIAGNOSIS — R262 Difficulty in walking, not elsewhere classified: Secondary | ICD-10-CM | POA: Diagnosis not present

## 2016-05-17 DIAGNOSIS — I69391 Dysphagia following cerebral infarction: Secondary | ICD-10-CM | POA: Diagnosis not present

## 2016-05-17 DIAGNOSIS — J449 Chronic obstructive pulmonary disease, unspecified: Secondary | ICD-10-CM | POA: Diagnosis not present

## 2016-05-19 DIAGNOSIS — R262 Difficulty in walking, not elsewhere classified: Secondary | ICD-10-CM | POA: Diagnosis not present

## 2016-05-19 DIAGNOSIS — I69391 Dysphagia following cerebral infarction: Secondary | ICD-10-CM | POA: Diagnosis not present

## 2016-05-19 DIAGNOSIS — Z4789 Encounter for other orthopedic aftercare: Secondary | ICD-10-CM | POA: Diagnosis not present

## 2016-05-19 DIAGNOSIS — E1140 Type 2 diabetes mellitus with diabetic neuropathy, unspecified: Secondary | ICD-10-CM | POA: Diagnosis not present

## 2016-05-19 DIAGNOSIS — J449 Chronic obstructive pulmonary disease, unspecified: Secondary | ICD-10-CM | POA: Diagnosis not present

## 2016-05-19 DIAGNOSIS — R1313 Dysphagia, pharyngeal phase: Secondary | ICD-10-CM | POA: Diagnosis not present

## 2016-05-20 DIAGNOSIS — R269 Unspecified abnormalities of gait and mobility: Secondary | ICD-10-CM | POA: Diagnosis not present

## 2016-05-20 DIAGNOSIS — E1165 Type 2 diabetes mellitus with hyperglycemia: Secondary | ICD-10-CM | POA: Diagnosis not present

## 2016-05-20 DIAGNOSIS — R630 Anorexia: Secondary | ICD-10-CM | POA: Diagnosis not present

## 2016-05-20 DIAGNOSIS — I10 Essential (primary) hypertension: Secondary | ICD-10-CM | POA: Diagnosis not present

## 2016-05-20 DIAGNOSIS — Z7984 Long term (current) use of oral hypoglycemic drugs: Secondary | ICD-10-CM | POA: Diagnosis not present

## 2016-05-20 DIAGNOSIS — F331 Major depressive disorder, recurrent, moderate: Secondary | ICD-10-CM | POA: Diagnosis not present

## 2016-05-20 DIAGNOSIS — E119 Type 2 diabetes mellitus without complications: Secondary | ICD-10-CM | POA: Diagnosis not present

## 2016-05-24 DIAGNOSIS — R1313 Dysphagia, pharyngeal phase: Secondary | ICD-10-CM | POA: Diagnosis not present

## 2016-05-24 DIAGNOSIS — R262 Difficulty in walking, not elsewhere classified: Secondary | ICD-10-CM | POA: Diagnosis not present

## 2016-05-24 DIAGNOSIS — E1140 Type 2 diabetes mellitus with diabetic neuropathy, unspecified: Secondary | ICD-10-CM | POA: Diagnosis not present

## 2016-05-24 DIAGNOSIS — J449 Chronic obstructive pulmonary disease, unspecified: Secondary | ICD-10-CM | POA: Diagnosis not present

## 2016-05-24 DIAGNOSIS — I69391 Dysphagia following cerebral infarction: Secondary | ICD-10-CM | POA: Diagnosis not present

## 2016-05-24 DIAGNOSIS — Z4789 Encounter for other orthopedic aftercare: Secondary | ICD-10-CM | POA: Diagnosis not present

## 2016-05-26 DIAGNOSIS — E1140 Type 2 diabetes mellitus with diabetic neuropathy, unspecified: Secondary | ICD-10-CM | POA: Diagnosis not present

## 2016-05-26 DIAGNOSIS — I69391 Dysphagia following cerebral infarction: Secondary | ICD-10-CM | POA: Diagnosis not present

## 2016-05-26 DIAGNOSIS — J449 Chronic obstructive pulmonary disease, unspecified: Secondary | ICD-10-CM | POA: Diagnosis not present

## 2016-05-26 DIAGNOSIS — Z4789 Encounter for other orthopedic aftercare: Secondary | ICD-10-CM | POA: Diagnosis not present

## 2016-05-26 DIAGNOSIS — R1313 Dysphagia, pharyngeal phase: Secondary | ICD-10-CM | POA: Diagnosis not present

## 2016-05-26 DIAGNOSIS — R262 Difficulty in walking, not elsewhere classified: Secondary | ICD-10-CM | POA: Diagnosis not present

## 2016-05-31 DIAGNOSIS — R1313 Dysphagia, pharyngeal phase: Secondary | ICD-10-CM | POA: Diagnosis not present

## 2016-05-31 DIAGNOSIS — J449 Chronic obstructive pulmonary disease, unspecified: Secondary | ICD-10-CM | POA: Diagnosis not present

## 2016-05-31 DIAGNOSIS — E1140 Type 2 diabetes mellitus with diabetic neuropathy, unspecified: Secondary | ICD-10-CM | POA: Diagnosis not present

## 2016-05-31 DIAGNOSIS — I69391 Dysphagia following cerebral infarction: Secondary | ICD-10-CM | POA: Diagnosis not present

## 2016-05-31 DIAGNOSIS — R262 Difficulty in walking, not elsewhere classified: Secondary | ICD-10-CM | POA: Diagnosis not present

## 2016-05-31 DIAGNOSIS — Z4789 Encounter for other orthopedic aftercare: Secondary | ICD-10-CM | POA: Diagnosis not present

## 2016-06-01 ENCOUNTER — Other Ambulatory Visit: Payer: Self-pay | Admitting: Neurosurgery

## 2016-06-01 DIAGNOSIS — M4712 Other spondylosis with myelopathy, cervical region: Secondary | ICD-10-CM | POA: Diagnosis not present

## 2016-06-01 DIAGNOSIS — M4012 Other secondary kyphosis, cervical region: Secondary | ICD-10-CM | POA: Diagnosis not present

## 2016-06-02 DIAGNOSIS — E1140 Type 2 diabetes mellitus with diabetic neuropathy, unspecified: Secondary | ICD-10-CM | POA: Diagnosis not present

## 2016-06-02 DIAGNOSIS — R1313 Dysphagia, pharyngeal phase: Secondary | ICD-10-CM | POA: Diagnosis not present

## 2016-06-02 DIAGNOSIS — J449 Chronic obstructive pulmonary disease, unspecified: Secondary | ICD-10-CM | POA: Diagnosis not present

## 2016-06-02 DIAGNOSIS — R262 Difficulty in walking, not elsewhere classified: Secondary | ICD-10-CM | POA: Diagnosis not present

## 2016-06-02 DIAGNOSIS — I69391 Dysphagia following cerebral infarction: Secondary | ICD-10-CM | POA: Diagnosis not present

## 2016-06-02 DIAGNOSIS — Z4789 Encounter for other orthopedic aftercare: Secondary | ICD-10-CM | POA: Diagnosis not present

## 2016-06-08 ENCOUNTER — Encounter (HOSPITAL_COMMUNITY): Payer: Self-pay

## 2016-06-08 ENCOUNTER — Encounter (HOSPITAL_COMMUNITY)
Admission: RE | Admit: 2016-06-08 | Discharge: 2016-06-08 | Disposition: A | Discharge status: Home / Self Care | Payer: Medicare Other | Source: Ambulatory Visit | Attending: Neurosurgery | Admitting: Neurosurgery

## 2016-06-08 DIAGNOSIS — I10 Essential (primary) hypertension: Secondary | ICD-10-CM | POA: Insufficient documentation

## 2016-06-08 DIAGNOSIS — F209 Schizophrenia, unspecified: Secondary | ICD-10-CM | POA: Insufficient documentation

## 2016-06-08 DIAGNOSIS — F329 Major depressive disorder, single episode, unspecified: Secondary | ICD-10-CM | POA: Insufficient documentation

## 2016-06-08 DIAGNOSIS — Z7984 Long term (current) use of oral hypoglycemic drugs: Secondary | ICD-10-CM

## 2016-06-08 DIAGNOSIS — E1140 Type 2 diabetes mellitus with diabetic neuropathy, unspecified: Secondary | ICD-10-CM

## 2016-06-08 DIAGNOSIS — M40202 Unspecified kyphosis, cervical region: Secondary | ICD-10-CM

## 2016-06-08 DIAGNOSIS — Z01812 Encounter for preprocedural laboratory examination: Secondary | ICD-10-CM

## 2016-06-08 DIAGNOSIS — Z8673 Personal history of transient ischemic attack (TIA), and cerebral infarction without residual deficits: Secondary | ICD-10-CM | POA: Insufficient documentation

## 2016-06-08 DIAGNOSIS — Z79899 Other long term (current) drug therapy: Secondary | ICD-10-CM | POA: Insufficient documentation

## 2016-06-08 DIAGNOSIS — J449 Chronic obstructive pulmonary disease, unspecified: Secondary | ICD-10-CM | POA: Insufficient documentation

## 2016-06-08 DIAGNOSIS — Z87891 Personal history of nicotine dependence: Secondary | ICD-10-CM

## 2016-06-08 HISTORY — DX: Dyspnea, unspecified: R06.00

## 2016-06-08 LAB — BASIC METABOLIC PANEL
ANION GAP: 9 (ref 5–15)
BUN: 23 mg/dL — AB (ref 6–20)
CALCIUM: 10.1 mg/dL (ref 8.9–10.3)
CO2: 23 mmol/L (ref 22–32)
Chloride: 106 mmol/L (ref 101–111)
Creatinine, Ser: 0.86 mg/dL (ref 0.44–1.00)
GFR calc Af Amer: >60 mL/min (ref >60)
GLUCOSE: 195 mg/dL — AB (ref 65–99)
Potassium: 4.5 mmol/L (ref 3.5–5.1)
Sodium: 138 mmol/L (ref 135–145)

## 2016-06-08 LAB — CBC
HEMATOCRIT: 35.5 % — AB (ref 36.0–46.0)
HEMOGLOBIN: 11.6 g/dL — AB (ref 12.0–15.0)
MCH: 27.9 pg (ref 26.0–34.0)
MCHC: 32.7 g/dL (ref 30.0–36.0)
MCV: 85.3 fL (ref 78.0–100.0)
Platelets: 168 10*3/uL (ref 150–400)
RBC: 4.16 MIL/uL (ref 3.87–5.11)
RDW: 15 % (ref 11.5–15.5)
WBC: 7.3 10*3/uL (ref 4.0–10.5)

## 2016-06-08 LAB — GLUCOSE, CAPILLARY: GLUCOSE-CAPILLARY: 187 mg/dL — AB (ref 65–99)

## 2016-06-08 LAB — TYPE AND SCREEN
ABO/RH(D): B POS
Antibody Screen: NEGATIVE

## 2016-06-08 LAB — ABO/RH: ABO/RH(D): B POS

## 2016-06-08 LAB — SURGICAL PCR SCREEN
MRSA, PCR: NEGATIVE
Staphylococcus aureus: NEGATIVE

## 2016-06-08 NOTE — Progress Notes (Addendum)
PCP - Dr. Darrow [facility]uss[facility]ibas [facility]ala [facility].  Cardiologist - denies  EKG - 03/25/16 CXR - denies  Echo/stress test/cardiac cath - denies  [name].  Patient accompanied by daughter, [name] to PAT appointment who helped to answer questions.    Patient's daughter stated that they do not check the patient's blood sugar at home but they do have a meter.  Nurse encouraged patient to check blood sugar every 2 hours the day of surgery and instructed patient and family on what to do if patient had a reading less than 70 the DOS.  Daughter verbalized understanding.

## 2016-06-08 NOTE — Pre-Procedure Instructions (Signed)
Lurline DelWallace D Biller  06/08/2016      [facility] [phone] - [place],  - [address] AT [facility] [address] [facility] [address] [place] [address] Phone: [phone] Fax: [phone]  [facility] - [place], [place] - [address] [place] [id number] Phone: [phone] Fax: [phone]  [facility] [address] - [place], [place] - [id number] [place] [address] Phone: [phone] Fax: [phone]    Your procedure is scheduled on Thursday, November 9th, 2017.  [facility]00 P.M.   Call this number if you have problems the morning of surgery:  [phone]   Remember:  Do not eat food or drink liquids after midnight.   Take these medicines the morning of surgery with A SIP OF WATER: Albuterol Nebulizer if needed, Meclizine (Antivert) if needed, Methocarbamol (Robaxin) if needed, Oxycodone-acetaminophen (Percocet) if needed.     WHAT DO I DO ABOUT MY DIABETES MEDICATION?  Marland Kitchen. Do not take oral diabetes medicines (pills) the morning of surgery.  Do NOT take Metformin the morning of surgery.    Stop taking: Aspirin, NSAIDS, Aleve, Naproxen, Ibuprofen, Advil, Motrin, BC's, Goody's, Fish oil, all herbal medications, and all vitamins.    How to Manage Your Diabetes Before and After Surgery  Why is it important to control my blood sugar before and after surgery? . Improving blood sugar levels before and after surgery helps healing and can limit problems. . A way of improving blood sugar control is eating a healthy diet by: o  Eating less sugar and carbohydrates o  Increasing activity/exercise o  Talking with your doctor about reaching your blood sugar goals . High blood sugars (greater than 180 mg/dL) can raise your risk of infections and slow your recovery, so you will need to focus on controlling your diabetes during the  weeks before surgery. . Make sure that the doctor who takes care of your diabetes knows about your planned surgery including the date and location.  How do I manage my blood sugar before surgery? . Check your blood sugar at least 4 times a day, starting 2 days before surgery, to make sure that the level is not too high or low. o Check your blood sugar the morning of your surgery when you wake up and every 2 hours until you get to the Short Stay unit. . If your blood sugar is less than 70 mg/dL, you will need to treat for low blood sugar: o Do not take insulin. o Treat a low blood sugar (less than 70 mg/dL) with  cup of clear juice (cranberry or apple), 4 glucose tablets, OR glucose gel. o Recheck blood sugar in 15 minutes after treatment (to make sure it is greater than 70 mg/dL). If your blood sugar is not greater than 70 mg/dL on recheck, call [phone] for further instructions. . Report your blood sugar to the short stay nurse when you get to Short Stay.  . If you are admitted to the hospital after surgery: o Your blood sugar will be checked by the staff and you will probably be given insulin after surgery (instead of oral diabetes medicines) to make sure you have good blood sugar levels. o The goal for blood sugar control after surgery is 80-180 mg/dL.    Do not wear jewelry, make-up or nail polish.  Do not wear lotions, powders, or perfumes, or deoderant.  Do  not shave 48 hours prior to surgery.    Do not bring valuables to the hospital.  [facility] any belongings or valuables.  Contacts, dentures or bridgework may not be worn into surgery.  Leave your suitcase in the car.  After surgery it may be brought to your room.  For patients admitted to the hospital, discharge time will be determined by your treatment team.  Patients discharged the day of surgery will not be allowed to drive home.   Special instructions:  Preparing for Surgery.   [place]-  Preparing For Surgery  Before surgery, you can play an important role. Because skin is not sterile, your skin needs to be as free of germs as possible. You can reduce the number of germs on your skin by washing with CHG (chlorahexidine gluconate) Soap before surgery.  CHG is an antiseptic cleaner which kills germs and bonds with the skin to continue killing germs even after washing.  Please do not use if you have an allergy to CHG or antibacterial soaps. If your skin becomes reddened/irritated stop using the CHG.  Do not shave (including legs and underarms) for at least 48 hours prior to first CHG shower. It is OK to shave your face.  Please follow these instructions carefully.   1. Shower the NIGHT BEFORE SURGERY and the MORNING OF SURGERY with CHG.   2. If you chose to wash your hair, wash your hair first as usual with your normal shampoo.  3. After you shampoo, rinse your hair and body thoroughly to remove the shampoo.  4. Use CHG as you would any other liquid soap. You can apply CHG directly to the skin and wash gently with a scrungie or a clean washcloth.   5. Apply the CHG Soap to your body ONLY FROM THE NECK DOWN.  Do not use on open wounds or open sores. Avoid contact with your eyes, ears, mouth and genitals (private parts). Wash genitals (private parts) with your normal soap.  6. Wash thoroughly, paying special attention to the area where your surgery will be performed.  7. Thoroughly rinse your body with warm water from the neck down.  8. DO NOT shower/wash with your normal soap after using and rinsing off the CHG Soap.  9. Pat yourself dry with a CLEAN TOWEL.   10. Wear CLEAN PAJAMAS   11. Place CLEAN SHEETS on your bed the night of your first shower and DO NOT SLEEP WITH PETS.  Day of Surgery: Do not apply any deodorants/lotions. Please wear clean clothes to the hospital/surgery center.     Please read over the following fact sheets that you were given. MRSA  Information

## 2016-06-09 ENCOUNTER — Encounter (HOSPITAL_COMMUNITY)
Admission: RE | Disposition: A | Discharge status: Skilled Nursing Facility | Payer: Self-pay | Source: Ambulatory Visit | Attending: Neurosurgery

## 2016-06-09 ENCOUNTER — Inpatient Hospital Stay (HOSPITAL_COMMUNITY): Payer: Medicare Other

## 2016-06-09 ENCOUNTER — Inpatient Hospital Stay (HOSPITAL_COMMUNITY): Payer: Medicare Other | Admitting: Certified Registered Nurse Anesthetist

## 2016-06-09 ENCOUNTER — Inpatient Hospital Stay (HOSPITAL_COMMUNITY)
Admission: RE | Admit: 2016-06-09 | Discharge: 2016-06-21 | DRG: 472 | Disposition: A | Discharge status: Skilled Nursing Facility | Payer: Medicare Other | Source: Ambulatory Visit | Attending: Neurosurgery | Admitting: Neurosurgery

## 2016-06-09 ENCOUNTER — Encounter (HOSPITAL_COMMUNITY): Payer: Self-pay | Admitting: Surgery

## 2016-06-09 DIAGNOSIS — Z7982 Long term (current) use of aspirin: Secondary | ICD-10-CM

## 2016-06-09 DIAGNOSIS — Z7984 Long term (current) use of oral hypoglycemic drugs: Secondary | ICD-10-CM | POA: Diagnosis not present

## 2016-06-09 DIAGNOSIS — E1140 Type 2 diabetes mellitus with diabetic neuropathy, unspecified: Secondary | ICD-10-CM | POA: Diagnosis present

## 2016-06-09 DIAGNOSIS — M6281 Muscle weakness (generalized): Secondary | ICD-10-CM | POA: Diagnosis not present

## 2016-06-09 DIAGNOSIS — Z9889 Other specified postprocedural states: Secondary | ICD-10-CM | POA: Diagnosis not present

## 2016-06-09 DIAGNOSIS — Z79891 Long term (current) use of opiate analgesic: Secondary | ICD-10-CM | POA: Diagnosis not present

## 2016-06-09 DIAGNOSIS — I10 Essential (primary) hypertension: Secondary | ICD-10-CM | POA: Diagnosis not present

## 2016-06-09 DIAGNOSIS — R1312 Dysphagia, oropharyngeal phase: Secondary | ICD-10-CM | POA: Diagnosis not present

## 2016-06-09 DIAGNOSIS — M40292 Other kyphosis, cervical region: Principal | ICD-10-CM | POA: Diagnosis present

## 2016-06-09 DIAGNOSIS — Z79899 Other long term (current) drug therapy: Secondary | ICD-10-CM | POA: Diagnosis not present

## 2016-06-09 DIAGNOSIS — Z87891 Personal history of nicotine dependence: Secondary | ICD-10-CM | POA: Diagnosis not present

## 2016-06-09 DIAGNOSIS — J449 Chronic obstructive pulmonary disease, unspecified: Secondary | ICD-10-CM | POA: Diagnosis present

## 2016-06-09 DIAGNOSIS — F209 Schizophrenia, unspecified: Secondary | ICD-10-CM | POA: Diagnosis present

## 2016-06-09 DIAGNOSIS — Z4889 Encounter for other specified surgical aftercare: Secondary | ICD-10-CM | POA: Diagnosis not present

## 2016-06-09 DIAGNOSIS — R531 Weakness: Secondary | ICD-10-CM | POA: Diagnosis not present

## 2016-06-09 DIAGNOSIS — F419 Anxiety disorder, unspecified: Secondary | ICD-10-CM | POA: Diagnosis present

## 2016-06-09 DIAGNOSIS — M4012 Other secondary kyphosis, cervical region: Secondary | ICD-10-CM | POA: Diagnosis present

## 2016-06-09 DIAGNOSIS — Z8673 Personal history of transient ischemic attack (TIA), and cerebral infarction without residual deficits: Secondary | ICD-10-CM | POA: Diagnosis not present

## 2016-06-09 DIAGNOSIS — R2689 Other abnormalities of gait and mobility: Secondary | ICD-10-CM | POA: Diagnosis not present

## 2016-06-09 DIAGNOSIS — Z419 Encounter for procedure for purposes other than remedying health state, unspecified: Secondary | ICD-10-CM

## 2016-06-09 DIAGNOSIS — M4322 Fusion of spine, cervical region: Secondary | ICD-10-CM | POA: Diagnosis not present

## 2016-06-09 DIAGNOSIS — M4712 Other spondylosis with myelopathy, cervical region: Secondary | ICD-10-CM | POA: Diagnosis present

## 2016-06-09 DIAGNOSIS — E119 Type 2 diabetes mellitus without complications: Secondary | ICD-10-CM | POA: Diagnosis not present

## 2016-06-09 DIAGNOSIS — M40202 Unspecified kyphosis, cervical region: Secondary | ICD-10-CM | POA: Diagnosis not present

## 2016-06-09 HISTORY — PX: POSTERIOR CERVICAL FUSION/FORAMINOTOMY: SHX5038

## 2016-06-09 LAB — GLUCOSE, CAPILLARY
GLUCOSE-CAPILLARY: 115 mg/dL — AB (ref 65–99)
Glucose-Capillary: 108 mg/dL — ABNORMAL HIGH (ref 65–99)
Glucose-Capillary: 206 mg/dL — ABNORMAL HIGH (ref 65–99)

## 2016-06-09 LAB — HEMOGLOBIN A1C
HEMOGLOBIN A1C: 7 % — AB (ref 4.8–5.6)
MEAN PLASMA GLUCOSE: 154 mg/dL

## 2016-06-09 SURGERY — POSTERIOR CERVICAL FUSION/FORAMINOTOMY LEVEL 4
Anesthesia: General

## 2016-06-09 MED ORDER — METFORMIN HCL 500 MG PO TABS
500.0000 mg | ORAL_TABLET | Freq: Two times a day (BID) | ORAL | Status: DC
  Administered 2016-06-10 – 2016-06-21 (×23): 500 mg via ORAL
  Filled 2016-06-09 (×23): qty 1

## 2016-06-09 MED ORDER — OXYCODONE-ACETAMINOPHEN 5-325 MG PO TABS
1.0000 | ORAL_TABLET | ORAL | Status: DC | PRN
  Administered 2016-06-10 – 2016-06-12 (×8): 2 via ORAL
  Administered 2016-06-14: 1 via ORAL
  Administered 2016-06-14: 2 via ORAL
  Administered 2016-06-15: 1 via ORAL
  Administered 2016-06-15 – 2016-06-18 (×5): 2 via ORAL
  Administered 2016-06-19 – 2016-06-21 (×4): 1 via ORAL
  Filled 2016-06-09: qty 2
  Filled 2016-06-09: qty 1
  Filled 2016-06-09 (×9): qty 2
  Filled 2016-06-09 (×3): qty 1
  Filled 2016-06-09 (×5): qty 2

## 2016-06-09 MED ORDER — SUCCINYLCHOLINE CHLORIDE 200 MG/10ML IV SOSY
PREFILLED_SYRINGE | INTRAVENOUS | Status: AC
  Filled 2016-06-09: qty 10

## 2016-06-09 MED ORDER — DOCUSATE SODIUM 100 MG PO CAPS
100.0000 mg | ORAL_CAPSULE | Freq: Two times a day (BID) | ORAL | Status: DC
  Administered 2016-06-09 – 2016-06-21 (×24): 100 mg via ORAL
  Filled 2016-06-09 (×24): qty 1

## 2016-06-09 MED ORDER — LIDOCAINE-EPINEPHRINE 1 %-1:100000 IJ SOLN
INTRAMUSCULAR | Status: AC
  Filled 2016-06-09: qty 1

## 2016-06-09 MED ORDER — SODIUM CHLORIDE 0.9 % IV SOLN
INTRAVENOUS | Status: DC
Start: 2016-06-09 — End: 2016-06-21
  Administered 2016-06-09: 1000 mL via INTRAVENOUS

## 2016-06-09 MED ORDER — THROMBIN 20000 UNITS EX SOLR
CUTANEOUS | Status: AC
  Filled 2016-06-09: qty 20000

## 2016-06-09 MED ORDER — GELATIN ABSORBABLE MT POWD
OROMUCOSAL | Status: DC | PRN
  Administered 2016-06-09: 18:00:00 via TOPICAL

## 2016-06-09 MED ORDER — SODIUM CHLORIDE 0.9% FLUSH
3.0000 mL | Freq: Two times a day (BID) | INTRAVENOUS | Status: DC
  Administered 2016-06-09 – 2016-06-12 (×7): 3 mL via INTRAVENOUS

## 2016-06-09 MED ORDER — PANTOPRAZOLE SODIUM 20 MG PO TBEC
20.0000 mg | DELAYED_RELEASE_TABLET | Freq: Every day | ORAL | Status: DC
  Administered 2016-06-09 – 2016-06-21 (×13): 20 mg via ORAL
  Filled 2016-06-09 (×13): qty 1

## 2016-06-09 MED ORDER — SODIUM CHLORIDE 0.9 % IV SOLN: 250.0000 mL | INTRAVENOUS | Status: DC

## 2016-06-09 MED ORDER — SUGAMMADEX SODIUM 200 MG/2ML IV SOLN
INTRAVENOUS | Status: AC
  Filled 2016-06-09: qty 2

## 2016-06-09 MED ORDER — MIDAZOLAM HCL 2 MG/2ML IJ SOLN
INTRAMUSCULAR | Status: AC
  Filled 2016-06-09: qty 2

## 2016-06-09 MED ORDER — SENNA 8.6 MG PO TABS
1.0000 | ORAL_TABLET | Freq: Two times a day (BID) | ORAL | Status: DC
  Administered 2016-06-09 – 2016-06-21 (×23): 8.6 mg via ORAL
  Filled 2016-06-09 (×23): qty 1

## 2016-06-09 MED ORDER — BACITRACIN ZINC 500 UNIT/GM EX OINT
TOPICAL_OINTMENT | CUTANEOUS | Status: DC | PRN
  Administered 2016-06-09: 1 via TOPICAL

## 2016-06-09 MED ORDER — BUPIVACAINE HCL (PF) 0.5 % IJ SOLN
INTRAMUSCULAR | Status: DC | PRN
  Administered 2016-06-09: 5 mL

## 2016-06-09 MED ORDER — LACTATED RINGERS IV SOLN
INTRAVENOUS | Status: DC | PRN
  Administered 2016-06-09: 17:00:00 via INTRAVENOUS

## 2016-06-09 MED ORDER — ENSURE ENLIVE PO LIQD
237.0000 mL | Freq: Three times a day (TID) | ORAL | Status: DC
  Administered 2016-06-09 – 2016-06-10 (×2): 237 mL via ORAL
  Filled 2016-06-09 (×6): qty 237

## 2016-06-09 MED ORDER — HYDROMORPHONE HCL 1 MG/ML IJ SOLN
INTRAMUSCULAR | Status: AC
  Filled 2016-06-09: qty 0.5

## 2016-06-09 MED ORDER — INSULIN ASPART 100 UNIT/ML ~~LOC~~ SOLN
0.0000 [IU] | Freq: Three times a day (TID) | SUBCUTANEOUS | Status: DC
  Administered 2016-06-10: 8 [IU] via SUBCUTANEOUS
  Administered 2016-06-10: 15 [IU] via SUBCUTANEOUS
  Administered 2016-06-10: 5 [IU] via SUBCUTANEOUS
  Administered 2016-06-11: 8 [IU] via SUBCUTANEOUS
  Administered 2016-06-11: 3 [IU] via SUBCUTANEOUS
  Administered 2016-06-11: 8 [IU] via SUBCUTANEOUS
  Administered 2016-06-12: 3 [IU] via SUBCUTANEOUS
  Administered 2016-06-12: 5 [IU] via SUBCUTANEOUS
  Administered 2016-06-12: 8 [IU] via SUBCUTANEOUS
  Administered 2016-06-13: 3 [IU] via SUBCUTANEOUS
  Administered 2016-06-13: 11 [IU] via SUBCUTANEOUS
  Administered 2016-06-14: 5 [IU] via SUBCUTANEOUS
  Administered 2016-06-14 – 2016-06-15 (×2): 8 [IU] via SUBCUTANEOUS
  Administered 2016-06-15: 3 [IU] via SUBCUTANEOUS
  Administered 2016-06-16: 5 [IU] via SUBCUTANEOUS
  Administered 2016-06-16: 8 [IU] via SUBCUTANEOUS
  Administered 2016-06-17 (×2): 3 [IU] via SUBCUTANEOUS
  Administered 2016-06-18: 2 [IU] via SUBCUTANEOUS
  Administered 2016-06-18: 3 [IU] via SUBCUTANEOUS
  Administered 2016-06-19 – 2016-06-21 (×3): 2 [IU] via SUBCUTANEOUS

## 2016-06-09 MED ORDER — MIDAZOLAM HCL 5 MG/5ML IJ SOLN
INTRAMUSCULAR | Status: DC | PRN
  Administered 2016-06-09: 2 mg via INTRAVENOUS

## 2016-06-09 MED ORDER — CHLORHEXIDINE GLUCONATE CLOTH 2 % EX PADS: 6.0000 | MEDICATED_PAD | Freq: Once | CUTANEOUS | Status: DC

## 2016-06-09 MED ORDER — SUGAMMADEX SODIUM 200 MG/2ML IV SOLN
INTRAVENOUS | Status: DC | PRN
  Administered 2016-06-09: 200 mg via INTRAVENOUS

## 2016-06-09 MED ORDER — LABETALOL HCL 5 MG/ML IV SOLN: 10.0000 mg | INTRAVENOUS | Status: DC | PRN

## 2016-06-09 MED ORDER — ARTIFICIAL TEARS OP OINT
TOPICAL_OINTMENT | OPHTHALMIC | Status: DC | PRN
Start: 2016-06-09 — End: 2016-06-09
  Administered 2016-06-09: 1 via OPHTHALMIC

## 2016-06-09 MED ORDER — EPHEDRINE 5 MG/ML INJ
INTRAVENOUS | Status: AC
  Filled 2016-06-09: qty 10

## 2016-06-09 MED ORDER — CEFAZOLIN SODIUM-DEXTROSE 2-4 GM/100ML-% IV SOLN
2.0000 g | Freq: Three times a day (TID) | INTRAVENOUS | Status: AC
  Administered 2016-06-09 – 2016-06-10 (×2): 2 g via INTRAVENOUS
  Filled 2016-06-09 (×2): qty 100

## 2016-06-09 MED ORDER — CEFAZOLIN SODIUM 1 G IJ SOLR
INTRAMUSCULAR | Status: AC
  Filled 2016-06-09: qty 20

## 2016-06-09 MED ORDER — CEFAZOLIN SODIUM-DEXTROSE 2-4 GM/100ML-% IV SOLN
INTRAVENOUS | Status: AC
  Filled 2016-06-09: qty 100

## 2016-06-09 MED ORDER — ALBUTEROL SULFATE (2.5 MG/3ML) 0.083% IN NEBU
2.5000 mg | INHALATION_SOLUTION | Freq: Four times a day (QID) | RESPIRATORY_TRACT | Status: DC | PRN
Start: 2016-06-09 — End: 2016-06-21
  Administered 2016-06-10 – 2016-06-11 (×2): 2.5 mg via RESPIRATORY_TRACT
  Filled 2016-06-09 (×2): qty 3

## 2016-06-09 MED ORDER — LIDOCAINE 2% (20 MG/ML) 5 ML SYRINGE
INTRAMUSCULAR | Status: AC
  Filled 2016-06-09: qty 5

## 2016-06-09 MED ORDER — LABETALOL HCL 5 MG/ML IV SOLN
5.0000 mg | Freq: Once | INTRAVENOUS | Status: AC
  Administered 2016-06-09: 5 mg via INTRAVENOUS

## 2016-06-09 MED ORDER — LACTATED RINGERS IV SOLN
INTRAVENOUS | Status: DC
  Administered 2016-06-09: 14:00:00 via INTRAVENOUS

## 2016-06-09 MED ORDER — MENTHOL 3 MG MT LOZG
1.0000 | LOZENGE | OROMUCOSAL | Status: DC | PRN
  Filled 2016-06-09: qty 9

## 2016-06-09 MED ORDER — BACITRACIN ZINC 500 UNIT/GM EX OINT
TOPICAL_OINTMENT | CUTANEOUS | Status: AC
  Filled 2016-06-09: qty 28.35

## 2016-06-09 MED ORDER — MECLIZINE HCL 25 MG PO TABS: 25.0000 mg | ORAL_TABLET | Freq: Three times a day (TID) | ORAL | Status: DC | PRN

## 2016-06-09 MED ORDER — TRAZODONE HCL 50 MG PO TABS
150.0000 mg | ORAL_TABLET | Freq: Every day | ORAL | Status: DC
  Administered 2016-06-09 – 2016-06-20 (×11): 150 mg via ORAL
  Filled 2016-06-09 (×11): qty 1

## 2016-06-09 MED ORDER — THROMBIN 20000 UNITS EX SOLR
CUTANEOUS | Status: DC | PRN
  Administered 2016-06-09: 20000 [IU] via TOPICAL

## 2016-06-09 MED ORDER — SODIUM CHLORIDE 0.9 % IR SOLN
Status: DC | PRN
  Administered 2016-06-09: 18:00:00

## 2016-06-09 MED ORDER — LIDOCAINE HCL (CARDIAC) 20 MG/ML IV SOLN
INTRAVENOUS | Status: DC | PRN
  Administered 2016-06-09: 100 mg via INTRAVENOUS

## 2016-06-09 MED ORDER — HYDROMORPHONE HCL 1 MG/ML IJ SOLN
0.2500 mg | INTRAMUSCULAR | Status: DC | PRN
  Administered 2016-06-09 (×2): 0.5 mg via INTRAVENOUS

## 2016-06-09 MED ORDER — SUCCINYLCHOLINE CHLORIDE 20 MG/ML IJ SOLN
INTRAMUSCULAR | Status: DC | PRN
  Administered 2016-06-09: 100 mg via INTRAVENOUS

## 2016-06-09 MED ORDER — POLYETHYLENE GLYCOL 3350 17 G PO PACK: 17.0000 g | PACK | Freq: Every day | ORAL | Status: DC | PRN

## 2016-06-09 MED ORDER — DEXAMETHASONE SODIUM PHOSPHATE 10 MG/ML IJ SOLN
INTRAMUSCULAR | Status: DC | PRN
  Administered 2016-06-09: 10 mg via INTRAVENOUS

## 2016-06-09 MED ORDER — ONDANSETRON HCL 4 MG/2ML IJ SOLN
INTRAMUSCULAR | Status: AC
  Filled 2016-06-09: qty 2

## 2016-06-09 MED ORDER — ROCURONIUM BROMIDE 100 MG/10ML IV SOLN
INTRAVENOUS | Status: DC | PRN
  Administered 2016-06-09: 20 mg via INTRAVENOUS
  Administered 2016-06-09: 50 mg via INTRAVENOUS
  Administered 2016-06-09: 10 mg via INTRAVENOUS

## 2016-06-09 MED ORDER — SODIUM CHLORIDE 0.9% FLUSH: 3.0000 mL | INTRAVENOUS | Status: DC | PRN

## 2016-06-09 MED ORDER — FENTANYL CITRATE (PF) 100 MCG/2ML IJ SOLN
INTRAMUSCULAR | Status: AC
  Filled 2016-06-09: qty 4

## 2016-06-09 MED ORDER — LABETALOL HCL 5 MG/ML IV SOLN
INTRAVENOUS | Status: AC
  Administered 2016-06-09: 5 mg
  Filled 2016-06-09: qty 4

## 2016-06-09 MED ORDER — LACTATED RINGERS IV SOLN
INTRAVENOUS | Status: DC | PRN
  Administered 2016-06-09: 16:00:00 via INTRAVENOUS

## 2016-06-09 MED ORDER — HEMOSTATIC AGENTS (NO CHARGE) OPTIME
TOPICAL | Status: DC | PRN
  Administered 2016-06-09: 1 via TOPICAL

## 2016-06-09 MED ORDER — BISACODYL 10 MG RE SUPP: 10.0000 mg | Freq: Every day | RECTAL | Status: DC | PRN

## 2016-06-09 MED ORDER — ONDANSETRON HCL 4 MG/2ML IJ SOLN
INTRAMUSCULAR | Status: DC | PRN
  Administered 2016-06-09: 4 mg via INTRAVENOUS

## 2016-06-09 MED ORDER — PROPOFOL 10 MG/ML IV BOLUS
INTRAVENOUS | Status: AC
  Filled 2016-06-09: qty 20

## 2016-06-09 MED ORDER — PHENOL 1.4 % MT LIQD
1.0000 | OROMUCOSAL | Status: DC | PRN
Start: 2016-06-09 — End: 2016-06-21

## 2016-06-09 MED ORDER — METHOCARBAMOL 500 MG PO TABS
500.0000 mg | ORAL_TABLET | Freq: Four times a day (QID) | ORAL | Status: DC | PRN
  Administered 2016-06-09 – 2016-06-19 (×6): 500 mg via ORAL
  Filled 2016-06-09 (×6): qty 1

## 2016-06-09 MED ORDER — HYDROMORPHONE HCL 1 MG/ML IJ SOLN
0.5000 mg | INTRAMUSCULAR | Status: DC | PRN
  Administered 2016-06-10 (×2): 1 mg via INTRAVENOUS
  Filled 2016-06-09 (×3): qty 1

## 2016-06-09 MED ORDER — THROMBIN 5000 UNITS EX SOLR
CUTANEOUS | Status: AC
  Filled 2016-06-09: qty 5000

## 2016-06-09 MED ORDER — FENTANYL CITRATE (PF) 100 MCG/2ML IJ SOLN
INTRAMUSCULAR | Status: DC | PRN
  Administered 2016-06-09: 100 ug via INTRAVENOUS
  Administered 2016-06-09 (×2): 50 ug via INTRAVENOUS

## 2016-06-09 MED ORDER — ACETAMINOPHEN 650 MG RE SUPP: 650.0000 mg | RECTAL | Status: DC | PRN

## 2016-06-09 MED ORDER — PROPOFOL 10 MG/ML IV BOLUS
INTRAVENOUS | Status: DC | PRN
  Administered 2016-06-09: 130 mg via INTRAVENOUS

## 2016-06-09 MED ORDER — ARTIFICIAL TEARS OP OINT
TOPICAL_OINTMENT | OPHTHALMIC | Status: AC
  Filled 2016-06-09: qty 3.5

## 2016-06-09 MED ORDER — ACETAMINOPHEN 325 MG PO TABS: 650.0000 mg | ORAL_TABLET | ORAL | Status: DC | PRN

## 2016-06-09 MED ORDER — ONDANSETRON HCL 4 MG/2ML IJ SOLN
4.0000 mg | INTRAMUSCULAR | Status: DC | PRN
  Administered 2016-06-09: 4 mg via INTRAVENOUS
  Filled 2016-06-09: qty 2

## 2016-06-09 MED ORDER — ZIPRASIDONE HCL 80 MG PO CAPS
80.0000 mg | ORAL_CAPSULE | Freq: Every day | ORAL | Status: DC
  Administered 2016-06-09 – 2016-06-20 (×12): 80 mg via ORAL
  Filled 2016-06-09 (×13): qty 1

## 2016-06-09 MED ORDER — LIDOCAINE-EPINEPHRINE 1 %-1:100000 IJ SOLN
INTRAMUSCULAR | Status: DC | PRN
  Administered 2016-06-09: 5 mL

## 2016-06-09 MED ORDER — CEFAZOLIN SODIUM-DEXTROSE 2-4 GM/100ML-% IV SOLN
2.0000 g | INTRAVENOUS | Status: AC
  Administered 2016-06-09: 2 g via INTRAVENOUS

## 2016-06-09 SURGICAL SUPPLY — 62 items
BAG DECANTER FOR FLEXI CONT (MISCELLANEOUS) ×3 IMPLANT
BENZOIN TINCTURE PRP APPL 2/3 (GAUZE/BANDAGES/DRESSINGS) ×3 IMPLANT
BIT DRILL 2.4X (BIT) ×1 IMPLANT
BIT DRL 2.4X (BIT) ×1
BLADE CLIPPER SURG (BLADE) ×3 IMPLANT
BLADE ULTRA TIP 2M (BLADE) IMPLANT
BUR MATCHSTICK NEURO 3.0 LAGG (BURR) ×3 IMPLANT
BUR PRECISION FLUTE 5.0 (BURR) ×3 IMPLANT
CANISTER SUCT 3000ML PPV (MISCELLANEOUS) ×3 IMPLANT
CLOSURE WOUND 1/2 X4 (GAUZE/BANDAGES/DRESSINGS) ×1
DECANTER SPIKE VIAL GLASS SM (MISCELLANEOUS) ×3 IMPLANT
DRAPE C-ARM 42X72 X-RAY (DRAPES) ×6 IMPLANT
DRAPE LAPAROTOMY 100X72 PEDS (DRAPES) ×3 IMPLANT
DRAPE POUCH INSTRU U-SHP 10X18 (DRAPES) ×3 IMPLANT
DRILL BIT (BIT) ×2
DRSG OPSITE POSTOP 4X8 (GAUZE/BANDAGES/DRESSINGS) ×3 IMPLANT
DURAPREP 6ML APPLICATOR 50/CS (WOUND CARE) ×3 IMPLANT
ELECT REM PT RETURN 9FT ADLT (ELECTROSURGICAL) ×3
ELECTRODE REM PT RTRN 9FT ADLT (ELECTROSURGICAL) ×1 IMPLANT
GAUZE SPONGE 4X4 12PLY STRL (GAUZE/BANDAGES/DRESSINGS) IMPLANT
GAUZE SPONGE 4X4 16PLY XRAY LF (GAUZE/BANDAGES/DRESSINGS) IMPLANT
GLOVE BIO SURGEON STRL SZ 6.5 (GLOVE) ×4 IMPLANT
GLOVE BIO SURGEONS STRL SZ 6.5 (GLOVE) ×2
GLOVE BIOGEL PI IND STRL 7.5 (GLOVE) ×4 IMPLANT
GLOVE BIOGEL PI INDICATOR 7.5 (GLOVE) ×8
GLOVE ECLIPSE 7.0 STRL STRAW (GLOVE) ×3 IMPLANT
GLOVE ECLIPSE 7.5 STRL STRAW (GLOVE) ×9 IMPLANT
GLOVE EXAM NITRILE LRG STRL (GLOVE) IMPLANT
GLOVE EXAM NITRILE XL STR (GLOVE) ×3 IMPLANT
GLOVE EXAM NITRILE XS STR PU (GLOVE) IMPLANT
GOWN STRL REUS W/ TWL LRG LVL3 (GOWN DISPOSABLE) ×3 IMPLANT
GOWN STRL REUS W/ TWL XL LVL3 (GOWN DISPOSABLE) IMPLANT
GOWN STRL REUS W/TWL 2XL LVL3 (GOWN DISPOSABLE) ×3 IMPLANT
GOWN STRL REUS W/TWL LRG LVL3 (GOWN DISPOSABLE) ×6
GOWN STRL REUS W/TWL XL LVL3 (GOWN DISPOSABLE)
GRAFT BN 5X1XSPNE CVD POST DBM (Bone Implant) ×1 IMPLANT
GRAFT BONE MAGNIFUSE 1X5CM (Bone Implant) ×2 IMPLANT
KIT BASIN OR (CUSTOM PROCEDURE TRAY) ×3 IMPLANT
KIT INFUSE SMALL (Orthopedic Implant) ×3 IMPLANT
KIT ROOM TURNOVER OR (KITS) ×3 IMPLANT
NEEDLE HYPO 25X1 1.5 SAFETY (NEEDLE) ×3 IMPLANT
NS IRRIG 1000ML POUR BTL (IV SOLUTION) ×3 IMPLANT
PACK LAMINECTOMY NEURO (CUSTOM PROCEDURE TRAY) ×3 IMPLANT
PAD ARMBOARD 7.5X6 YLW CONV (MISCELLANEOUS) ×9 IMPLANT
PIN MAYFIELD SKULL DISP (PIN) ×3 IMPLANT
ROD VERTEX 70MM (Rod) ×6 IMPLANT
SCREW CANCELLOUS 3.5X14MM (Screw) ×30 IMPLANT
SCREW SET M6 (Screw) ×30 IMPLANT
SPONGE LAP 4X18 X RAY DECT (DISPOSABLE) IMPLANT
SPONGE SURGIFOAM ABS GEL 100 (HEMOSTASIS) ×3 IMPLANT
STAPLER SKIN PROX WIDE 3.9 (STAPLE) IMPLANT
STAPLER VISISTAT 35W (STAPLE) ×3 IMPLANT
STRIP CLOSURE SKIN 1/2X4 (GAUZE/BANDAGES/DRESSINGS) ×2 IMPLANT
SUT ETHILON 3 0 FSL (SUTURE) IMPLANT
SUT VIC AB 0 CT1 18XCR BRD8 (SUTURE) ×2 IMPLANT
SUT VIC AB 0 CT1 8-18 (SUTURE) ×4
SUT VIC AB 2-0 CT1 18 (SUTURE) ×3 IMPLANT
SUT VICRYL 3-0 RB1 18 ABS (SUTURE) ×3 IMPLANT
TOWEL OR 17X24 6PK STRL BLUE (TOWEL DISPOSABLE) ×3 IMPLANT
TOWEL OR 17X26 10 PK STRL BLUE (TOWEL DISPOSABLE) ×3 IMPLANT
UNDERPAD 30X30 (UNDERPADS AND DIAPERS) ×3 IMPLANT
WATER STERILE IRR 1000ML POUR (IV SOLUTION) ×3 IMPLANT

## 2016-06-09 NOTE — Anesthesia Preprocedure Evaluation (Addendum)
Anesthesia Evaluation  Patient identified by MRN, date of birth, ID band Patient awake    Reviewed: Allergy & Precautions, NPO status , Patient's Chart, lab work & pertinent test results  History of Anesthesia Complications (+) PROLONGED EMERGENCE  Airway Mallampati: II      Comment: Pt in hard cervical collar. Does take it off for showers and daily care. [facility]   Pulmonary shortness of breath, COPD, former smoker,    breath sounds clear to auscultation       Cardiovascular hypertension,  Rhythm:Regular Rate:Normal     Neuro/Psych  Headaches,    GI/Hepatic negative GI ROS, Neg liver ROS,   Endo/Other  diabetes  Renal/GU negative Renal ROS     Musculoskeletal  (+) Arthritis ,   Abdominal   Peds  Hematology   Anesthesia Other Findings   Reproductive/Obstetrics                           Anesthesia Physical Anesthesia Plan  ASA: III  Anesthesia Plan: General   Post-op Pain Management:    Induction: Intravenous  Airway Management Planned: Video Laryngoscope Planned  Additional Equipment:   Intra-op Plan:   Post-operative Plan: Possible Post-op intubation/ventilation  Informed Consent: I have reviewed the patients History and Physical, chart, labs and discussed the procedure including the risks, benefits and alternatives for the proposed anesthesia with the patient or authorized representative who has indicated his/her understanding and acceptance.   Dental advisory given  Plan Discussed with: CRNA and Anesthesiologist  Anesthesia Plan Comments:       Anesthesia Quick Evaluation

## 2016-06-09 NOTE — Progress Notes (Addendum)
Pt arrived around 2030 alert and oriented complaining of pain 5 on 0-10 scale, neuro intact family in room patient not hungry at this time feeding supplement given. Will continue to monitor.

## 2016-06-09 NOTE — H&P (Signed)
CC:  No chief complaint on file.   HPI: Mr. [name] is a 70 year old woman I'm seeing in follow-up, approximately 2 months status post C3 C4, C4 C5 ACDF with C6 corpectomy.  She was last seen about a month ago, with postoperative x-rays which did demonstrate pullout of the screws in the middle of the construct, likely related to development of straightening of the cervical lordosis.  Further f/u demonstrated continued progressive kyphosis of the cervical spine. She continues to have imbalance when walking, although her daughter say that when therapists aren't available, she actually walks reasonably well.   PMH: Past Medical History:  Diagnosis Date  . Anxiety   . Cervical spondylosis with myelopathy   . Complication of anesthesia    hard time waking up with last procedure years ago  . COPD (chronic obstructive pulmonary disease) (HCC)   . Depression   . Diabetes mellitus without complication (HCC)   . Diabetic neuropathy (HCC)   . Dyspnea   . Headache    migraines  . Hypertension   . Memory deficit   . Pancreatitis   . Schizophrenia (HCC)   . Stroke [facility])    no deficits  . Wears dentures   . Wears glasses     PSH: Past Surgical History:  Procedure Laterality Date  . ANTERIOR CERVICAL DECOMPRESSION/DISCECTOMY FUSION 4 LEVELS N/A 04/01/2016   Procedure: ANTERIOR CERVICAL DECOMPRESSION/DISCECTOMY FUSION CERVICAL THREE- CERVICAL FOUR, CERVICAL FOUR-CERVICAL FIVE, CORPECTOMY C6;  Surgeon: Lisbeth RenshawNeelesh Sumaiyah Markert, MD;  Location: [facility];  Service: Neurosurgery;  Laterality: N/A;  ANTERIOR CERVICAL DECOMPRESSION/DISCECTOMY FUSION C3-C4, C4-C5,C5-C6,C6-C7  . FOOT SURGERY Right   . MULTIPLE TOOTH EXTRACTIONS    . RHINOPLASTY    . ROTATOR CUFF REPAIR    . TONSILLECTOMY    . TUBAL LIGATION      SH: Social History  Substance Use Topics  . Smoking status: Former Smoker    Types: Cigarettes  . Smokeless tobacco: Never Used     Comment: in 2013  . Alcohol use No    MEDS: Prior  to Admission medications   Medication Sig Start Date End Date Taking? Authorizing Provider  albuterol (PROVENTIL) (2.5 MG/3ML) 0.083% nebulizer solution Take 2.5 mg by nebulization every 6 (six) hours as needed for wheezing or shortness of breath.   Yes Historical Provider, MD  aspirin EC 81 MG tablet Take 81 mg by mouth daily.   Yes Historical Provider, MD  ENSURE (ENSURE) Take 237 mLs by mouth 3 (three) times daily between meals.   Yes Historical Provider, MD  meclizine (ANTIVERT) 25 MG tablet Take 25 mg by mouth 3 (three) times daily as needed for dizziness.   Yes Historical Provider, MD  metFORMIN (GLUCOPHAGE) 500 MG tablet Take 500 mg by mouth 2 (two) times daily with a meal.   Yes Historical Provider, MD  methocarbamol (ROBAXIN) 500 MG tablet Take 1 tablet (500 mg total) by mouth every 6 (six) hours as needed for muscle spasms. 04/05/16  Yes Lisbeth RenshawNeelesh Hendy Brindle, MD  oxyCODONE-acetaminophen (PERCOCET/ROXICET) 5-325 MG tablet Take 1-2 tablets by mouth every 4 (four) hours as needed for moderate pain. 04/05/16  Yes Lisbeth RenshawNeelesh Dealie Koelzer, MD  traZODone (DESYREL) 150 MG tablet Take 150 mg by mouth at bedtime.   Yes Historical Provider, MD  ziprasidone (GEODON) 80 MG capsule Take 80 mg by mouth at bedtime.    Yes Historical Provider, MD    ALLERGY: Allergies  Allergen Reactions  . No Known Allergies     ROS: ROS  NEUROLOGIC  EXAM: Awake, alert, oriented Memory and concentration grossly intact Speech fluent, appropriate CN grossly intact Motor exam: Upper Extremities Deltoid Bicep Tricep Grip  Right 5/5 5/5 4/5 4/5  Left 5/5 5/5 4/5 4/5   Lower Extremity IP Quad PF DF EHL  Right 5/5 5/5 5/5 5/5 5/5  Left 5/5 5/5 5/5 5/5 5/5   Sensation grossly intact to LT  IMGAING: Repeat AP and lateral cervical spine x-rays were completed in the office.  These demonstrate progressive now kyphotic angulation centered primarily at C5, with pullout of the anterior plate and screws.  Position of the  interbody hardware appears stable.  IMPRESSION: -70 year old woman with progressive kyphosis after 4 level cervical decompression fusion.  She will need further stabilization from a posterior approach in order to halt further kyphotic deformity.  PLAN: - C3 to C7 lateral mass stabilization and posterolateral fusion  I did review the x-ray findings with the patient and her family, including the need for a second operation in order to halt the progression of deformity.  Risks of the operation were again reviewed including nerve/spinal cord injury, spinal fluid leak, bleeding, infection, and need for additional surgeries.  Again, we discussed risks of anesthesia.  The patient and her family understood our discussion and are willing to proceed as above.  All questions were answered

## 2016-06-09 NOTE — Op Note (Signed)
PREOP DIAGNOSIS:  1. Cervical spondylosis with myelopathy 2. Cervical kyphotic deformity   POSTOP DIAGNOSIS: Same  PROCEDURE: 1. C3-C7 posterior instrumented stabilization with lateral mass screws - [employer] 2. Posterolateral fusion, C3-4, C4-5, C5-6, C6-7 3. Use of non-structural bone allograft - BMP, 5cm Magnifuse  SURGEON: Dr. Lisbeth RenshawNeelesh Mileah Hemmer, MD  ASSISTANT: Dr. Coletta MemosKyle Cabbell, MD  ANESTHESIA: General Endotracheal  EBL: 250cc  SPECIMENS: None  DRAINS: None  COMPLICATIONS: None immediate  CONDITION:  Hemodynamically stable to PACU  HISTORY: [name] is a 70 y.o. female who had previously undergone C3-4, C4-5, and C6 corpectomy for cervical stenosis with myelopathy. She was followed in the outpatient clinic over the next few weeks and found to develop progressive kyphotic deformity at the operated levels. She therefore required posterior stabilization to halt the progression of her kyphosis. Risks and benefits of the surgery were explained in detail to the patient and her daughters. After all questions were answered, informed consent was obtained and witnessed.  PROCEDURE IN DETAIL: The patient was brought to the operating room via stretcher. After induction of general anesthesia, the patient was placed in the [place]field head holder, and positioned on the operative table in the prone position. All pressure points were meticulously padded. Skin incision was then marked out and prepped and draped in the usual sterile fashion.  After timeout was conducted, incision was infiltrated with local anesthetic with epinephrine. Incision was then made sharply, and Bovie electrocautery was used to dissect through the subcutaneous tissue until the nuchal fascia was identified and incised. The suboccipital musculature was then divided in the avascular midline plane. The spinous processes of the cervical vertebrae were then identified, and subperiosteal dissection was carried out along the  lamina. Self-retaining retractor was then placed. Penetrating towel clip was placed over the C3 spinous process, and location was confirmed with intraoperative fluoroscopy. At this point, dissection was carried further laterally in the subperiosteal plane until the lateral portion of the lateral masses were identified at C3, C4, C5, C6, and C7.  At this point, lateral mass trajectory pilot holes were then placed and tapped to 14 mm bilaterally at C3, C4, C5, C6, and C7. The exposed bone surfaces including the spinous process, lamina, and lateral mass and facet joint complexes at C3-4, C4-5, C5-6, and C6-7 were all decorticated with the high-speed drill. 14 mm screws were then placed at C3-C7. 70 mm rod with pre-bent lordosis was then placed and setscrews were placed and final tightened.  BMP was then placed over the exposed bone surfaces along with morcellized bone allograft in a bag.  Hemostasis was then achieved with morcellized Gelfoam and thrombin and bipolar electrocautery. The self-retaining retractors were then removed, and the wound was closed in multiple layers using a combination of 0 Vicryl stitches. Skin was closed with standard surgical skin staples. Sterile dressing was then applied. The patient was transferred to the stretcher and removed from the Mayfield head holder. She was asked abated, and taken to the postanesthesia care unit in stable hemodynamic condition. At the end of the case all sponge, needle, instrument, and cottonoid counts were correct.

## 2016-06-09 NOTE — Transfer of Care (Signed)
Immediate Anesthesia Transfer of Care Note  Patient: [name]  Procedure(s) Performed: Procedure(s): Cervical three-four, four-five, five-six, six-seven posterior cervical fusion (N/A)  Patient Location: PACU  Anesthesia Type:General  Level of Consciousness: awake  Airway & Oxygen Therapy: Patient Spontanous Breathing  Post-op Assessment: Report given to RN and Post -op Vital signs reviewed and stable  Post vital signs: Reviewed and stable  Last Vitals:  Vitals:   06/09/16 1320  BP: (!) 156/63  Pulse: (!) 43  Resp: 18  Temp: 36.7 C    Last Pain:  Vitals:   06/09/16 1320  TempSrc: Oral      Patients Stated Pain Goal: 3 (06/09/16 1335)  Complications: No apparent anesthesia complications

## 2016-06-09 NOTE — Anesthesia Procedure Notes (Signed)
Procedure Name: Intubation Date/Time: 06/09/2016 4:41 PM Performed by: Annabelle HarmanSMITH, Juno Bozard A Pre-anesthesia Checklist: Patient identified, Emergency Drugs available, Suction available, Patient being monitored and Timeout performed Patient Re-evaluated:Patient Re-evaluated prior to inductionOxygen Delivery Method: Circle system utilized Preoxygenation: Pre-oxygenation with 100% oxygen Intubation Type: IV induction Ventilation: Mask ventilation without difficulty Laryngoscope Size: Glidescope Tube type: Oral Tube size: 7.5 mm Number of attempts: 1 Placement Confirmation: ETT inserted through vocal cords under direct vision,  positive ETCO2 and breath sounds checked- equal and bilateral Secured at: 21 cm Tube secured with: Tape Dental Injury: Teeth and Oropharynx as per pre-operative assessment

## 2016-06-10 ENCOUNTER — Encounter (HOSPITAL_COMMUNITY): Payer: Self-pay | Admitting: Neurosurgery

## 2016-06-10 LAB — GLUCOSE, CAPILLARY
GLUCOSE-CAPILLARY: 97 mg/dL (ref 65–99)
Glucose-Capillary: 275 mg/dL — ABNORMAL HIGH (ref 65–99)
Glucose-Capillary: 234 mg/dL — ABNORMAL HIGH (ref 65–99)
Glucose-Capillary: 370 mg/dL — ABNORMAL HIGH (ref 65–99)

## 2016-06-10 MED ORDER — GLUCERNA SHAKE PO LIQD
237.0000 mL | Freq: Three times a day (TID) | ORAL | Status: DC
  Administered 2016-06-10 – 2016-06-21 (×24): 237 mL via ORAL
  Filled 2016-06-10 (×36): qty 237

## 2016-06-10 MED ORDER — WHITE PETROLATUM GEL
Status: AC
  Filled 2016-06-10: qty 1

## 2016-06-10 NOTE — Evaluation (Signed)
Occupational Therapy Evaluation Patient Details Name: [name] Urizar MRN: [id number] DOB: [birth year] Today's Date: 06/10/2016    History of Present Illness pt is a 70 y/o female with h/o stroke, schizophrenia, DM, diabetic neuropathy, COPD, admitted with cervical spondylosis and kyphotic deformity, s/p c3-c7 PLA with allograft and instrumented stabilization.   Clinical Impression   Pt admitted with the above diagnosis and has the deficits outline below. Pt would benefit from cont OT to increase her independence and safety with her basic adls so she can return home to live with her daughters.  Pt's daughters do work but pt will need strict 24/7 assist in order to dc home with HHOT due to residual cognitive deficits combined with the weakness she has experienced from this second surgery in 2 months.  If family not available, she will need to Cross/c to SNF.      Follow Up Recommendations  Home health OT;Supervision/Assistance - 24 hour    Equipment Recommendations  None recommended by OT    Recommendations for Other Services       Precautions / Restrictions Precautions Precautions: Fall;Cervical Restrictions Weight Bearing Restrictions: No      Mobility Bed Mobility Overal bed mobility: Needs Assistance Bed Mobility: Sidelying to Sit;Rolling;Sit to Sidelying Rolling: Min assist Sidelying to sit: Min assist     Sit to sidelying: Mod assist General bed mobility comments: trouble getting pt to understand technique for laying down  Transfers Overall transfer level: Needs assistance Equipment used: Rolling walker (2 wheeled) Transfers: Sit to/from Stand Sit to Stand: Min assist         General transfer comment: vc's for hand placement, assist to stand    Balance Overall balance assessment: Needs assistance Sitting-balance support: Feet supported Sitting balance-Leahy Scale: Fair     Standing balance support: Bilateral upper extremity supported;During functional  activity Standing balance-Leahy Scale: Poor Standing balance comment: Pt must have outside support to safely remain standing.                            ADL Overall ADL's : Needs assistance/impaired Eating/Feeding: Set up;Sitting   Grooming: Wash/dry hands;Oral care;Wash/dry face;Minimal assistance;Standing Grooming Details (indicate cue type and reason): pt stood at sink to groom. pt required cues for problem solving skills re: using soap dispenser and towel dispenser. Upper Body Bathing: Minimal assitance;Sitting Upper Body Bathing Details (indicate cue type and reason): min assist to be thorough Lower Body Bathing: Minimal assistance;Sit to/from stand Lower Body Bathing Details (indicate cue type and reason): min assist to keep balance in standing while washing backside. Upper Body Dressing : Minimal assistance;Sitting Upper Body Dressing Details (indicate cue type and reason): requires assist getting shirt over head and to donn neck brace. Pt does not wear neck brace tight enough and her chin falls through chin rest. Explained need to wear brace correctly.   Lower Body Dressing: Moderate assistance;Sit to/from stand Lower Body Dressing Details (indicate cue type and reason): Pt requires assist to start pants over legs and in standing while pulling pants up.  Toilet Transfer: Minimal assistance;Regular Toilet;RW;Ambulation;Cueing for sequencing;Cueing for Chief of Staffsafety Toilet Transfer Details (indicate cue type and reason): Pt walked to bathroom with walker but had great difficulty lining herself up correctly to sit down on the commode. Feel pt did not have awareness of where the toilet was in the room.  Pt nervous to lower self down to commode but did with min assist. Toileting- Clothing Manipulation and Hygiene:  Supervision/safety;Sitting/lateral lean;Cueing for safety       Functional mobility during ADLs: Minimal assistance;Rolling walker General ADL Comments: Pt will require  24/7 S for safety. Pt seems to have difficulty lining self up correctly to sit on toilet and in chair.  Pt's CVA symptoms require pt need S and min assist at all times when home.     Vision Vision Assessment?: Yes Eye Alignment: Within Functional Limits Ocular Range of Motion: Within Functional Limits Alignment/Gaze Preference: Within Defined Limits Tracking/Visual Pursuits: Able to track stimulus in all quads without difficulty Visual Fields: No apparent deficits Additional Comments: Tests intact although considered if pt had deficits due to difficulty running into things in her room and lining self up with chair.  This could be due to having neck brace on and being unable to see down.   Perception Perception Perception Tested?: No   Praxis Praxis Praxis tested?: Within functional limits    Pertinent Vitals/Pain Pain Assessment: Faces Faces Pain Scale: Hurts little more Pain Location: neck Pain Descriptors / Indicators: Aching;Operative site guarding Pain Intervention(s): Limited activity within patient's tolerance;Monitored during session;Repositioned     Hand Dominance Right   Extremity/Trunk Assessment Upper Extremity Assessment Upper Extremity Assessment: RUE deficits/detail;LUE deficits/detail RUE Deficits / Details: AROM WFL.  Strength WFL LUE Deficits / Details: ROM WFL.  Strength:  shoulders and triceps 4/5   Lower Extremity Assessment Lower Extremity Assessment: Defer to PT evaluation   Cervical / Trunk Assessment Cervical / Trunk Assessment: Kyphotic   Communication Communication Communication: Expressive difficulties   Cognition Arousal/Alertness: Awake/alert Behavior During Therapy: WFL for tasks assessed/performed Overall Cognitive Status: History of cognitive impairments - at baseline       Memory: Decreased recall of precautions;Decreased short-term memory             General Comments       Exercises       Shoulder Instructions       Home Living Family/patient expects to be discharged to:: Private residence Living Arrangements: Children Available Help at Discharge: Family;Available 24 hours/day Type of Home: House Home Access: Stairs to enter [employer] of Steps: 1 Entrance Stairs-Rails: None Home Layout: One level     Bathroom Shower/Tub: Tub/shower unit Shower/tub characteristics: Engineer, building services: Standard Bathroom Accessibility: Yes How Accessible: Accessible via walker Home Equipment: Walker - 2 wheels;Bedside commode;Shower seat   Additional Comments: Pt lives with daughter.  pt states daughter works but that someone is with her at all times.      Prior Functioning/Environment Level of Independence: Needs assistance  Gait / Transfers Assistance Needed: used rollator ADL's / Homemaking Assistance Needed: requires assist getting into and out of shower and assist with dressing at times. Pt can toilet without assist.   Comments: required assist with fasterners and IADLs         OT Problem List: Decreased strength;Impaired balance (sitting and/or standing);Decreased cognition;Decreased safety awareness;Decreased knowledge of use of DME or AE;Impaired UE functional use;Pain   OT Treatment/Interventions: Self-care/ADL training;Therapeutic activities;DME and/or AE instruction    OT Goals(Current goals can be found in the care plan section) Acute Rehab OT Goals Patient Stated Goal: to get better OT Goal Formulation: With patient Time For Goal Achievement: 06/24/16 Potential to Achieve Goals: Fair ADL Goals Pt Will Perform Grooming: with supervision;standing Pt Will Perform Lower Body Dressing: with min assist;sit to/from stand Pt Will Transfer to Toilet: with supervision;ambulating;bedside commode;regular height toilet (3:1 over commode) Pt Will Perform Tub/Shower Transfer: Tub transfer;with min assist;shower seat;rolling  walker Additional ADL Goal #1: Pt will line self up with  chair/toilet she is going to sit on properly and state she feels that seat behind her before sitting without assist.  OT Frequency: Min 2X/week   Barriers to Cross/C: Decreased caregiver support  must have 24 hour S.  Some question as to if this truly is available.       Co-evaluation              End of Session Equipment Utilized During Treatment: Rolling walker Nurse Communication: Mobility status  Activity Tolerance: Patient tolerated treatment well Patient left: in chair;with call bell/phone within reach;with chair alarm set   Time: [id number] OT Time Calculation (min): 21 min Charges:  OT General Charges $OT Visit: 1 Procedure OT Evaluation $OT Eval Moderate Complexity: 1 Procedure G-Codes:    Hope BuddsJones, Truman Aceituno Anne 06/10/2016, 12:07 PM  [phone]

## 2016-06-10 NOTE — NC FL2 (Signed)
[place] MEDICAID FL2 LEVEL OF CARE SCREENING TOOL     IDENTIFICATION  Patient Name: [name] Birthdate: 12-25-1945 Sex: female Admission Date (Current Location): 06/09/2016  [facility] [place]   Facility and Address:  [facility] [place], [address], [place], [place] [address]      Provider Number: [id number]  Attending Physician Name and Address:  Lisbeth RenshawNeelesh Nundkumar, MD  Relative Name and Phone Number:  Walker ShadowRita Minnifee (Daughter) [phone]    Current Level of Care: Home Recommended Level of Care:  SNF Prior Approval Number:    Date Approved/Denied:   PASRR Number: Submitted for PASRR 06/10/2016 - MUST ID [id number]  Discharge Plan: SNF    Current Diagnoses: Patient Active Problem List   Diagnosis Date Noted  . Other secondary kyphosis, cervical region 06/09/2016  . Schizophrenia (HCC) 04/29/2016  . Type 2 diabetes mellitus (HCC) 04/29/2016  . Cervical spondylosis with myelopathy 04/01/2016  . Myelopathy (HCC) 02/19/2016  . Cognitive impairment 02/19/2016  . History of alcohol abuse 02/19/2016    Orientation RESPIRATION BLADDER Height & Weight     Self, Time, Situation, Place  Normal Continent Weight: 126 lb 12.8 oz (57.5 kg) Height:  5\' 5"  (165.1 cm)  BEHAVIORAL SYMPTOMS/MOOD NEUROLOGICAL BOWEL NUTRITION STATUS      Continent Diet (Heart Healthy/Carb Modified)  AMBULATORY STATUS COMMUNICATION OF NEEDS Skin   Limited Assist Verbally Other (Comment) (Surgical Incision upper back)                       Personal Care Assistance Level of Assistance  Bathing, Feeding, Dressing Bathing Assistance: Limited assistance Feeding assistance: Limited assistance Dressing Assistance: Limited assistance     Functional Limitations Info  Sight, Speech Sight Info: Impaired (Wears glasses) Hearing Info: Adequate Speech Info: Adequate    SPECIAL CARE FACTORS FREQUENCY  PT (By licensed PT)     PT Frequency: Min  5X/week              Contractures Contractures Info: Not present    Additional Factors Info  Allergies, Code Status Code Status Info: Full Code Allergies Info: NKDA           Current Medications (06/10/2016):  This is the current hospital active medication list Current Facility-Administered Medications  Medication Dose Route Frequency Provider Last Rate Last Dose  . 0.9 %  sodium chloride infusion   Intravenous Continuous Lisbeth RenshawNeelesh Nundkumar, MD 75 mL/hr at 06/09/16 2138 1,000 mL at 06/09/16 2138  . 0.9 %  sodium chloride infusion  250 mL Intravenous Continuous Lisbeth RenshawNeelesh Nundkumar, MD   Stopped at 06/09/16 2122  . acetaminophen (TYLENOL) tablet 650 mg  650 mg Oral Q4H PRN Lisbeth RenshawNeelesh Nundkumar, MD       Or  . acetaminophen (TYLENOL) suppository 650 mg  650 mg Rectal Q4H PRN Lisbeth RenshawNeelesh Nundkumar, MD      . albuterol (PROVENTIL) (2.5 MG/3ML) 0.083% nebulizer solution 2.5 mg  2.5 mg Nebulization Q6H PRN Lisbeth RenshawNeelesh Nundkumar, MD      . bisacodyl (DULCOLAX) suppository 10 mg  10 mg Rectal Daily PRN Lisbeth RenshawNeelesh Nundkumar, MD      . docusate sodium (COLACE) capsule 100 mg  100 mg Oral BID Lisbeth RenshawNeelesh Nundkumar, MD   100 mg at 06/10/16 0916  . feeding supplement (ENSURE ENLIVE) (ENSURE ENLIVE) liquid 237 mL  237 mL Oral TID BM Lisbeth RenshawNeelesh Nundkumar, MD   237 mL at 06/10/16 1341  . HYDROmorphone (DILAUDID) injection 0.5-1 mg  0.5-1 mg  Intravenous Q2H PRN Lisbeth RenshawNeelesh Nundkumar, MD   1 mg at 06/10/16 1018  . insulin aspart (novoLOG) injection 0-15 Units  0-15 Units Subcutaneous TID WC Lisbeth RenshawNeelesh Nundkumar, MD   5 Units at 06/10/16 1222  . meclizine (ANTIVERT) tablet 25 mg  25 mg Oral TID PRN Lisbeth RenshawNeelesh Nundkumar, MD      . menthol-cetylpyridinium (CEPACOL) lozenge 3 mg  1 lozenge Oral PRN Lisbeth RenshawNeelesh Nundkumar, MD       Or  . phenol (CHLORASEPTIC) mouth spray 1 spray  1 spray Mouth/Throat PRN Lisbeth RenshawNeelesh Nundkumar, MD      . metFORMIN (GLUCOPHAGE) tablet 500 mg  500 mg Oral BID WC Lisbeth RenshawNeelesh Nundkumar, MD   500 mg at 06/10/16 0809  .  methocarbamol (ROBAXIN) tablet 500 mg  500 mg Oral Q6H PRN Lisbeth RenshawNeelesh Nundkumar, MD   500 mg at 06/10/16 0809  . ondansetron (ZOFRAN) injection 4 mg  4 mg Intravenous Q4H PRN Lisbeth RenshawNeelesh Nundkumar, MD   4 mg at 06/09/16 2124  . oxyCODONE-acetaminophen (PERCOCET/ROXICET) 5-325 MG per tablet 1-2 tablet  1-2 tablet Oral Q4H PRN Lisbeth RenshawNeelesh Nundkumar, MD   2 tablet at 06/10/16 0809  . pantoprazole (PROTONIX) EC tablet 20 mg  20 mg Oral Daily Lisbeth RenshawNeelesh Nundkumar, MD   20 mg at 06/10/16 0916  . polyethylene glycol (MIRALAX / GLYCOLAX) packet 17 g  17 g Oral Daily PRN Lisbeth RenshawNeelesh Nundkumar, MD      . senna (SENOKOT) tablet 8.6 mg  1 tablet Oral BID Lisbeth RenshawNeelesh Nundkumar, MD   8.6 mg at 06/10/16 0916  . sodium chloride flush (NS) 0.9 % injection 3 mL  3 mL Intravenous Q12H Lisbeth RenshawNeelesh Nundkumar, MD   3 mL at 06/10/16 1000  . sodium chloride flush (NS) 0.9 % injection 3 mL  3 mL Intravenous PRN Lisbeth RenshawNeelesh Nundkumar, MD      . traZODone (DESYREL) tablet 150 mg  150 mg Oral QHS Lisbeth RenshawNeelesh Nundkumar, MD   150 mg at 06/09/16 2124  . ziprasidone (GEODON) capsule 80 mg  80 mg Oral QHS Lisbeth RenshawNeelesh Nundkumar, MD   80 mg at 06/09/16 2138     Discharge Medications: Please see discharge summary for a list of discharge medications.  Relevant Imaging Results:  Relevant Lab Results:   Additional Information SSN:  [id number]  Webster LionsLecretia Icelynn Onken, Student-Social Work [phone]

## 2016-06-10 NOTE — NC FL2 (Deleted)
[place] MEDICAID FL2 LEVEL OF CARE SCREENING TOOL     IDENTIFICATION  Patient Name: [name] D Casady Birthdate: 03-12-1946 Sex: female Admission Date (Current Location): 06/09/2016  [facility] [place]   Facility and Address:  [facility] [place], [address], [place], [place] [address]      Provider Number: [id number]  Attending Physician Name and Address:  Lisbeth RenshawNeelesh Nundkumar, MD  Relative Name and Phone Number:  Walker ShadowRita [place]e (Daughter) [phone]    Current Level of Care: Home Recommended Level of Care:   Prior Approval Number:    Date Approved/Denied:   PASRR Number: Submitted for PASRR 06/10/2016 - MUST ID [id number]  Discharge Plan: SNF    Current Diagnoses: Patient Active Problem List   Diagnosis Date Noted  . Other secondary kyphosis, cervical region 06/09/2016  . Schizophrenia (HCC) 04/29/2016  . Type 2 diabetes mellitus (HCC) 04/29/2016  . Cervical spondylosis with myelopathy 04/01/2016  . Myelopathy (HCC) 02/19/2016  . Cognitive impairment 02/19/2016  . History of alcohol abuse 02/19/2016    Orientation RESPIRATION BLADDER Height & Weight     Self, Time, Situation, Place  Normal Continent Weight: 126 lb 12.8 oz (57.5 kg) Height:  5\' 5"  (165.1 cm)  BEHAVIORAL SYMPTOMS/MOOD NEUROLOGICAL BOWEL NUTRITION STATUS      Continent Diet (Heart Healthy/Carb Modified)  AMBULATORY STATUS COMMUNICATION OF NEEDS Skin   Limited Assist Verbally Other (Comment) (Surgical Incision upper back)                       Personal Care Assistance Level of Assistance  Bathing, Feeding, Dressing Bathing Assistance: Limited assistance Feeding assistance: Limited assistance Dressing Assistance: Limited assistance     Functional Limitations Info  Sight, Speech Sight Info: Impaired (Wears glasses) Hearing Info: Adequate Speech Info: Adequate    SPECIAL CARE FACTORS FREQUENCY  PT (By licensed PT)     PT Frequency: Min  5X/week              Contractures Contractures Info: Not present    Additional Factors Info  Allergies, Code Status Code Status Info: Full Code Allergies Info: NKDA           Current Medications (06/10/2016):  This is the current hospital active medication list Current Facility-Administered Medications  Medication Dose Route Frequency Provider Last Rate Last Dose  . 0.9 %  sodium chloride infusion   Intravenous Continuous Lisbeth RenshawNeelesh Nundkumar, MD 75 mL/hr at 06/09/16 2138 1,000 mL at 06/09/16 2138  . 0.9 %  sodium chloride infusion  250 mL Intravenous Continuous Lisbeth RenshawNeelesh Nundkumar, MD   Stopped at 06/09/16 2122  . acetaminophen (TYLENOL) tablet 650 mg  650 mg Oral Q4H PRN Lisbeth RenshawNeelesh Nundkumar, MD       Or  . acetaminophen (TYLENOL) suppository 650 mg  650 mg Rectal Q4H PRN Lisbeth RenshawNeelesh Nundkumar, MD      . albuterol (PROVENTIL) (2.5 MG/3ML) 0.083% nebulizer solution 2.5 mg  2.5 mg Nebulization Q6H PRN Lisbeth RenshawNeelesh Nundkumar, MD      . bisacodyl (DULCOLAX) suppository 10 mg  10 mg Rectal Daily PRN Lisbeth RenshawNeelesh Nundkumar, MD      . docusate sodium (COLACE) capsule 100 mg  100 mg Oral BID Lisbeth RenshawNeelesh Nundkumar, MD   100 mg at 06/10/16 0916  . feeding supplement (ENSURE ENLIVE) (ENSURE ENLIVE) liquid 237 mL  237 mL Oral TID BM Lisbeth RenshawNeelesh Nundkumar, MD   237 mL at 06/10/16 1341  . HYDROmorphone (DILAUDID) injection 0.5-1 mg  0.5-1 mg  Intravenous Q2H PRN Lisbeth RenshawNeelesh Nundkumar, MD   1 mg at 06/10/16 1018  . insulin aspart (novoLOG) injection 0-15 Units  0-15 Units Subcutaneous TID WC Lisbeth RenshawNeelesh Nundkumar, MD   5 Units at 06/10/16 1222  . meclizine (ANTIVERT) tablet 25 mg  25 mg Oral TID PRN Lisbeth RenshawNeelesh Nundkumar, MD      . menthol-cetylpyridinium (CEPACOL) lozenge 3 mg  1 lozenge Oral PRN Lisbeth RenshawNeelesh Nundkumar, MD       Or  . phenol (CHLORASEPTIC) mouth spray 1 spray  1 spray Mouth/Throat PRN Lisbeth RenshawNeelesh Nundkumar, MD      . metFORMIN (GLUCOPHAGE) tablet 500 mg  500 mg Oral BID WC Lisbeth RenshawNeelesh Nundkumar, MD   500 mg at 06/10/16 0809  .  methocarbamol (ROBAXIN) tablet 500 mg  500 mg Oral Q6H PRN Lisbeth RenshawNeelesh Nundkumar, MD   500 mg at 06/10/16 0809  . ondansetron (ZOFRAN) injection 4 mg  4 mg Intravenous Q4H PRN Lisbeth RenshawNeelesh Nundkumar, MD   4 mg at 06/09/16 2124  . oxyCODONE-acetaminophen (PERCOCET/ROXICET) 5-325 MG per tablet 1-2 tablet  1-2 tablet Oral Q4H PRN Lisbeth RenshawNeelesh Nundkumar, MD   2 tablet at 06/10/16 0809  . pantoprazole (PROTONIX) EC tablet 20 mg  20 mg Oral Daily Lisbeth RenshawNeelesh Nundkumar, MD   20 mg at 06/10/16 0916  . polyethylene glycol (MIRALAX / GLYCOLAX) packet 17 g  17 g Oral Daily PRN Lisbeth RenshawNeelesh Nundkumar, MD      . senna (SENOKOT) tablet 8.6 mg  1 tablet Oral BID Lisbeth RenshawNeelesh Nundkumar, MD   8.6 mg at 06/10/16 0916  . sodium chloride flush (NS) 0.9 % injection 3 mL  3 mL Intravenous Q12H Lisbeth RenshawNeelesh Nundkumar, MD   3 mL at 06/10/16 1000  . sodium chloride flush (NS) 0.9 % injection 3 mL  3 mL Intravenous PRN Lisbeth RenshawNeelesh Nundkumar, MD      . traZODone (DESYREL) tablet 150 mg  150 mg Oral QHS Lisbeth RenshawNeelesh Nundkumar, MD   150 mg at 06/09/16 2124  . ziprasidone (GEODON) capsule 80 mg  80 mg Oral QHS Lisbeth RenshawNeelesh Nundkumar, MD   80 mg at 06/09/16 2138     Discharge Medications: Please see discharge summary for a list of discharge medications.  Relevant Imaging Results:  Relevant Lab Results:   Additional Information SSN:  [id number]  [place]ster LionsLecretia [place], Student-Social Work [phone]

## 2016-06-10 NOTE — Evaluation (Signed)
Physical Therapy Evaluation Patient Details Name: [name] MRN: [id number] DOB: 04-Oct-1945 Today's Date: 06/10/2016   History of Present Illness  pt is a 70 y/o female with h/o stroke, schizophrenia, DM, diabetic neuropathy, COPD, admitted with cervical spondylosis and kyphotic deformity, s/p c3-c7 PLA with allograft and instrumented stabilization.  Clinical Impression  Pt admitted with/for cervical surgery.  Presently at a min assist.  Pt currently limited functionally due to the problems listed below.  (see problems list.)  Pt will benefit from PT to maximize function and safety to be able to get home safely with available assist of family.     Follow Up Recommendations Home health PT    Equipment Recommendations  None recommended by PT    Recommendations for Other Services       Precautions / Restrictions Precautions Precautions: Fall;Cervical Restrictions Weight Bearing Restrictions: No      Mobility  Bed Mobility Overal bed mobility: Needs Assistance Bed Mobility: Sidelying to Sit;Rolling;Sit to Sidelying Rolling: Min assist Sidelying to sit: Min assist     Sit to sidelying: Mod assist General bed mobility comments: trouble getting pt to understand technique for laying down  Transfers Overall transfer level: Needs assistance   Transfers: Sit to/from Stand Sit to Stand: Min assist         General transfer comment: vc's for hand placement, assist to stand  Ambulation/Gait Ambulation/Gait assistance: Min assist Ambulation Distance (Feet): 35 Feet Assistive device: Rolling walker (2 wheeled) Gait Pattern/deviations: Step-through pattern   Gait velocity interpretation: Below normal speed for age/gender General Gait Details: guarded short steps  Stairs            Wheelchair Mobility    Modified Rankin (Stroke Patients Only)       Balance Overall balance assessment: Needs assistance   Sitting balance-Leahy Scale: Fair        Standing balance-Leahy Scale: Poor Standing balance comment: reliant on the RW                             Pertinent Vitals/Pain Pain Assessment: Faces Faces Pain Scale: Hurts even more Pain Location: shoulder and incisional Pain Descriptors / Indicators: Aching;Sore;Guarding;Grimacing Pain Intervention(s): Monitored during session;Repositioned;Premedicated before session    Home Living Family/patient expects to be discharged to:: Unsure Living Arrangements: Children Available Help at Discharge: Family;Available 24 hours/day Type of Home: House Home Access: Stairs to enter Entrance Stairs-Rails: None Entrance Stairs-Number of Steps: 1 Home Layout: One level Home Equipment: Walker - 2 wheels;Bedside commode;Shower seat Additional Comments: pt lives with daughter usually, however she works    Prior Function Level of Independence: Soil scientisteeds assistance   Gait / Transfers Assistance Needed: used Research scientist (physical sciences)rollator  ADL's / Air traffic controllerHomemaking Assistance Needed: requires some help recently due to extremity weakness        Hand Dominance   Dominant Hand: Right    Extremity/Trunk Assessment   Upper Extremity Assessment: Defer to OT evaluation           Lower Extremity Assessment: Generalized weakness      Cervical / Trunk Assessment: Kyphotic  Communication   Communication: Expressive difficulties  Cognition Arousal/Alertness: Awake/alert Behavior During Therapy: WFL for tasks assessed/performed Overall Cognitive Status: Within Functional Limits for tasks assessed                      General Comments General comments (skin integrity, edema, etc.): pt and daughters instructed in neck precautions, log roll  and transitioning to/from sit, bracing issues, lifting restrictions and progression of activity.    Exercises     Assessment/Plan    PT Assessment Patient needs continued PT services  PT Problem List Decreased strength;Decreased activity tolerance;Decreased  balance;Decreased mobility;Decreased knowledge of precautions;Pain          PT Treatment Interventions DME instruction;Gait training;Functional mobility training;Therapeutic activities;Patient/family education    PT Goals (Current goals can be found in the Care Plan section)  Acute Rehab PT Goals PT Goal Formulation: With patient/family Potential to Achieve Goals: Good    Frequency Min 5X/week   Barriers to discharge        Co-evaluation               End of Session     Patient left: in chair;with call bell/phone within reach;with chair alarm set Nurse Communication: Mobility status         Time: 1002-1050 PT Time Calculation (min) (ACUTE ONLY): 48 min   Charges:   PT Evaluation $PT Eval Moderate Complexity: 1 Procedure PT Treatments $Gait Training: 8-22 mins $Therapeutic Activity: 8-22 mins   PT G Codes:        Ruie Sendejo, Eliseo GumKenneth V 06/10/2016, 11:19 AM 06/10/2016  Caldwell BingKen Sharif Rendell, PT [phone]  (pager)

## 2016-06-10 NOTE — Anesthesia Postprocedure Evaluation (Signed)
Anesthesia Post Note  Patient: [name] D Grisso  Procedure(s) Performed: Procedure(s) (LRB): Cervical three-four, four-five, five-six, six-seven posterior cervical fusion (N/A)  Patient location during evaluation: PACU Anesthesia Type: General Level of consciousness: awake Pain management: pain level controlled Vital Signs Assessment: post-procedure vital signs reviewed and stable Respiratory status: spontaneous breathing Cardiovascular status: stable Anesthetic complications: no    Last Vitals:  Vitals:   06/10/16 0530 06/10/16 1049  BP: (!) 178/71 (!) 144/68  Pulse: 65 85  Resp:  20  Temp: 36.8 C 37.1 C    Last Pain:  Vitals:   06/10/16 1049  TempSrc: Oral  PainSc:                  EDWARDS,Rylynn Kobs

## 2016-06-10 NOTE — Care Management Note (Signed)
Case Management Note  Patient Details  Name: [name] D Sealy MRN: [id number] Date of Birth: 07/03/1946  Subjective/Objective:  Pt underwent:   1. C3-C7 posterior instrumented stabilization with lateral mass screws - [employer] 2. Posterolateral fusion, C3-4, C4-5, C5-6, C6-7. She is from home with family.               Action/Plan: PT recommending HH services. Await OT assessment. CM following for d/c needs.   Expected Discharge Date:                  Expected Discharge Plan:  Home w Home Health Services  In-House Referral:     Discharge planning Services     Post Acute Care Choice:    Choice offered to:     DME Arranged:    DME Agency:     HH Arranged:    HH Agency:     Status of Service:  In process, will continue to follow  If discussed at Long Length of Stay Meetings, dates discussed:    Additional Comments:  Kermit BaloKelli F Latayna Ritchie, RN 06/10/2016, 11:28 AM

## 2016-06-10 NOTE — Clinical Social Work Note (Signed)
CSW acknowledges SNF consult. PT recommending HHPT.  CSW signing off. Consult again if any social work needs arise.  Lewanna Petrak, CSW [phone]  

## 2016-06-11 LAB — GLUCOSE, CAPILLARY
GLUCOSE-CAPILLARY: 176 mg/dL — AB (ref 65–99)
Glucose-Capillary: 263 mg/dL — ABNORMAL HIGH (ref 65–99)
Glucose-Capillary: 223 mg/dL — ABNORMAL HIGH (ref 65–99)

## 2016-06-11 NOTE — Progress Notes (Signed)
Physical Therapy Treatment Patient Details Name: [name] MRN: [id number] DOB: 02-Mar-1946 Today's Date: 06/11/2016    History of Present Illness pt is a 70 y/o female with h/o stroke, schizophrenia, DM, diabetic neuropathy, COPD, admitted with cervical spondylosis and kyphotic deformity, s/p c3-c7 PLA with allograft and instrumented stabilization.    PT Comments    Pt is making steady progress toward goals with increased gait distance today and overall less assistance/cues needed. Acute PT to continue during pt's hospital stay.  Follow Up Recommendations  Home health PT     Equipment Recommendations  None recommended by PT    Precautions / Restrictions Precautions Precautions: Fall;Cervical Required Braces or Orthoses: Cervical Brace Cervical Brace: At all times;Hard collar    Mobility  Bed Mobility Overal bed mobility: Needs Assistance Bed Mobility: Sit to Sidelying;Rolling Rolling: Min assist       Sit to sidelying: Min assist General bed mobility comments: moderate cues on correct technique and sequencing with bed flat and no rails used  Transfers Overall transfer level: Needs assistance Equipment used: Rolling walker (2 wheeled) Transfers: Sit to/from Stand Sit to Stand: Min guard         General transfer comment: cues to scoot to edge of chair and for hand placement for safety with standing and sitting   Ambulation/Gait Ambulation/Gait assistance: Min guard;Min assist Ambulation Distance (Feet): 120 Feet Assistive device: Rolling walker (2 wheeled) Gait Pattern/deviations: Step-through pattern;Narrow base of support;Decreased stride length Gait velocity: decreased Gait velocity interpretation: Below normal speed for age/gender General Gait Details: slow, steady pace. 1 episode of balance loss with making 180 degree turn with RW needing min assist to correct.        Cognition Arousal/Alertness: Awake/alert Behavior During Therapy: WFL for  tasks assessed/performed Overall Cognitive Status: Within Functional Limits for tasks assessed (h/o cognitive impairments- at baseline)       Memory: Decreased recall of precautions;Decreased short-term memory               Pertinent Vitals/Pain Pain Assessment: 0-10 Pain Score: 3  Pain Location: neck Pain Descriptors / Indicators: Aching;Operative site guarding Pain Intervention(s): Limited activity within patient's tolerance;Monitored during session;Premedicated before session;Repositioned     PT Goals (current goals can now be found in the care plan section) Acute Rehab PT Goals Patient Stated Goal: to get better PT Goal Formulation: With patient/family Potential to Achieve Goals: Good Progress towards PT goals: Progressing toward goals    Frequency    Min 5X/week      PT Plan Current plan remains appropriate    End of Session Equipment Utilized During Treatment: Gait belt;Cervical collar Activity Tolerance: Patient tolerated treatment well;No increased pain Patient left: in bed;with call bell/phone within reach;with nursing/sitter in room     Time: 1219-1237 PT Time Calculation (min) (ACUTE ONLY): 18 min  Charges:  $Gait Training: 8-22 mins                    Sallyanne KusterBury, Kathy 06/11/2016, 12:43 PM   Sallyanne KusterKathy Bury, PTA, [facility] [address], [address] [place], [place] [address] [phone] 06/11/16, 12:44 PM

## 2016-06-11 NOTE — Progress Notes (Signed)
No issues overnight. Ambulating with PT. Swallowing subjectively improved.  EXAM:  BP (!) 147/68 (BP Location: Right Arm)   Pulse 90   Temp 99 F (37.2 C) (Oral)   Resp 16   Ht 5\' 5"  (1.651 m)   Wt 57.5 kg (126 lb 12.8 oz)   SpO2 100%   BMI 21.10 kg/m   Awake, alert, oriented  Speech fluent, appropriate  CN grossly intact  5/5 BUE/BLE   IMPRESSION:  70 y.o. female s/p posterior cervical stabilization. Doing well  PLAN: - Cont to mobilize  - Will cont to assess regarding placement. [facility]

## 2016-06-12 LAB — GLUCOSE, CAPILLARY
GLUCOSE-CAPILLARY: 160 mg/dL — AB (ref 65–99)
GLUCOSE-CAPILLARY: 88 mg/dL (ref 65–99)
Glucose-Capillary: 226 mg/dL — ABNORMAL HIGH (ref 65–99)
Glucose-Capillary: 266 mg/dL — ABNORMAL HIGH (ref 65–99)

## 2016-06-12 NOTE — Progress Notes (Signed)
Occupational Therapy Treatment Patient Details Name: [name] MRN: [id number] DOB: [birth year] Today's Date: 06/12/2016    History of present illness pt is a 70 y/o female with h/o stroke, schizophrenia, DM, diabetic neuropathy, COPD, admitted with cervical spondylosis and kyphotic deformity, s/p c3-c7 PLA with allograft and instrumented stabilization.   OT comments  Pt. Continues with slow progress with skilled OT.  Noted difficulty with pivotal steps in preparation for sitting on toilet and recliner.  Max cues for safety and sequencing.  Agree with initial OT eval, Will need 24/7 S/assistance.   Follow Up Recommendations  Home health OT;Supervision/Assistance - 24 hour    Equipment Recommendations  None recommended by OT    Recommendations for Other Services      Precautions / Restrictions Precautions Precautions: Fall;Cervical Required Braces or Orthoses: Cervical Brace Cervical Brace: At all times;Hard collar       Mobility Bed Mobility               General bed mobility comments: seated in recliner upon entry into pts. room  Transfers Overall transfer level: Needs assistance Equipment used: Rolling walker (2 wheeled) Transfers: Sit to/from UGI CorporationStand;Stand Pivot Transfers Sit to Stand: Min assist Stand pivot transfers: Min assist       General transfer comment: increased need for physical guidance and verbal cues for initiation of pivotal turns    Balance                                   ADL Overall ADL's : Needs assistance/impaired                         Toilet Transfer: Minimal assistance;Regular Toilet;RW;Ambulation;Cueing for sequencing;Cueing for safety;BSC Toilet Transfer Details (indicate cue type and reason): 3n1 over commode, continues with difficulty lining up to the commode, stood beside it, continued to ask here did she know where the toilet was, she said yes but would not initate turning feet or any movement to  get in front of the toilet Toileting- Clothing Manipulation and Hygiene: Cueing for sequencing;Cueing for safety;Sit to/from stand;Minimal assistance Toileting - Clothing Manipulation Details (indicate cue type and reason): difficulty placing feminine pad into underwear     Functional mobility during ADLs: Minimal assistance;Rolling walker;Cueing for safety;Cueing for sequencing General ADL Comments: Pt will require 24/7 S for safety. Pt seems to have difficulty lining self up correctly to sit on toilet and in chair.  Pt's CVA symptoms require pt need S and min assist at all times when home.very slow processing, inconsistent initiation of tasks.  presented with same difficulties with pivoting to toilet and back to the recliner. 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"      Vision                     Perception     Praxis      Cognition   Behavior During Therapy: Flat affect Overall Cognitive Status: Difficult to assess       Memory: Decreased recall of precautions;Decreased short-term memory               Extremity/Trunk Assessment               Exercises     Shoulder Instructions  General Comments      Pertinent Vitals/ Pain       Pain Assessment: No/denies pain  Home Living                                          Prior Functioning/Environment              Frequency  Min 2X/week        Progress Toward Goals  OT Goals(current goals can now be found in the care plan section)  Progress towards OT goals: Progressing toward goals     Plan Discharge plan remains appropriate    Co-evaluation                 End of Session Equipment Utilized During Treatment: Gait belt;Rolling walker;Cervical collar   Activity Tolerance Patient tolerated treatment well   Patient Left in chair;with call bell/phone within reach   Nurse Communication           Time: [id number] OT Time Calculation (min): 14 min  Charges: OT General Charges $OT Visit: 1 Procedure OT Treatments $Self Care/Home Management : 8-22 mins  Robin LeuMorris, Chasey Dull Lorraine, COTA/L 06/12/2016, 9:49 AM

## 2016-06-12 NOTE — Progress Notes (Signed)
Patient ID: [name], female   DOB: 03-02-1946, 70 y.o.   MRN: [id number] Subjective: Patient reports some neck pain. No arm pain. This tingling or weakness.  Objective: Vital signs in last 24 hours: Temp:  [98.9 F (37.2 C)-100.6 F (38.1 C)] 99.4 F (37.4 C) (11/12 0956) Pulse Rate:  [90-100] 97 (11/12 0956) Resp:  [16-20] 16 (11/12 0956) BP: (137-182)/(73-84) 137/78 (11/12 0956) SpO2:  [97 %-100 %] 100 % (11/12 0956)  Intake/Output from previous day: 11/11 0701 - 11/12 0700 In: 240 [P.O.:240] Out: -  Intake/Output this shift: No intake/output data recorded.  Out of bed to chair, in collar, moves all extremities,  Lab Results: Lab Results  Component Value Date   WBC 7.3 06/08/2016   HGB 11.6 (L) 06/08/2016   HCT 35.5 (L) 06/08/2016   MCV 85.3 06/08/2016   PLT 168 06/08/2016   No results found for: INR, PROTIME BMET Lab Results  Component Value Date   NA 138 06/08/2016   K 4.5 06/08/2016   CL 106 06/08/2016   CO2 23 06/08/2016   GLUCOSE 195 (H) 06/08/2016   BUN 23 (H) 06/08/2016   CREATININE 0.86 06/08/2016   CALCIUM 10.1 06/08/2016    Studies/Results: No results found.  Assessment/Plan: Seems stable.   LOS: 3 days    Iveth Heidemann S 06/12/2016, 11:16 AM

## 2016-06-13 LAB — GLUCOSE, CAPILLARY
GLUCOSE-CAPILLARY: 190 mg/dL — AB (ref 65–99)
GLUCOSE-CAPILLARY: 307 mg/dL — AB (ref 65–99)
GLUCOSE-CAPILLARY: 101 mg/dL — AB (ref 65–99)
Glucose-Capillary: 262 mg/dL — ABNORMAL HIGH (ref 65–99)
Glucose-Capillary: 180 mg/dL — ABNORMAL HIGH (ref 65–99)

## 2016-06-13 NOTE — Progress Notes (Signed)
Visited with pt who said she felt pretty good considering she'd just had surgery (but she may not remember everything). She requested prayer, said she really enjoyed it, and asked me to come again. Chaplain available for follow-up.   06/13/16 1400  Clinical Encounter Type  Visited With Patient  Visit Type Initial  Referral From Chaplain  Spiritual Encounters  Spiritual Needs Prayer;Emotional  Stress Factors  Patient Stress Factors Health changes;Loss of control

## 2016-06-13 NOTE — Progress Notes (Signed)
No issues overnight. No significant c/o this am.  EXAM:  BP (!) 167/72 (BP Location: Left Arm)   Pulse 70   Temp 99 F (37.2 C) (Oral)   Resp 16   Ht 5\' 5"  (1.651 m)   Wt 57.5 kg (126 lb 12.8 oz)   SpO2 98%   BMI 21.10 kg/m   Awake, alert, oriented  Speech fluent, appropriate  CN grossly intact  Good strength  IMPRESSION:  70 y.o. female s/p pos cervical stabilization/fusion. Doing well. After discussing with pt and PT, I think she would do better in a SNF setting upon discharge  PLAN: - Will get SW for SNF placment - Cont to mobilize

## 2016-06-13 NOTE — Progress Notes (Signed)
Physical Therapy Treatment Patient Details Name: [name] MRN: [id number] DOB: July 21, 1946 Today's Date: 06/13/2016    History of Present Illness pt is a 70 y/o female with h/o stroke, schizophrenia, DM, diabetic neuropathy, COPD, admitted with cervical spondylosis and kyphotic deformity, s/p c3-c7 PLA with allograft and instrumented stabilization.    PT Comments    Pt progressing slowly towards physical therapy goals. Required PT to assist in repositioning brace several times throughout session and often tucked her chin down off the chin plate. Discussed with pt and daughter the recommendation for continued therapy at the SNF level. Both pt and daughter are agreeable. MD updated. Will continue to follow and progress as able per POC.   Follow Up Recommendations  SNF;Supervision/Assistance - 24 hour     Equipment Recommendations  None recommended by PT    Recommendations for Other Services       Precautions / Restrictions Precautions Precautions: Fall;Cervical Required Braces or Orthoses: Cervical Brace Cervical Brace: At all times;Hard collar Restrictions Weight Bearing Restrictions: No    Mobility  Bed Mobility Overal bed mobility: Needs Assistance Bed Mobility: Supine to Sit     Supine to sit: HOB elevated;Min assist     General bed mobility comments: Increased time to transition to EOB. Assist to complete scooting activity so feet were resting on floor.   Transfers Overall transfer level: Needs assistance Equipment used: Rolling walker (2 wheeled) Transfers: Sit to/from Stand Sit to Stand: Min assist         General transfer comment: Hands-on assist required for balance support.   Ambulation/Gait Ambulation/Gait assistance: Min assist Ambulation Distance (Feet): 40 Feet Assistive device: Rolling walker (2 wheeled) Gait Pattern/deviations: Step-through pattern;Decreased stride length;Trunk flexed Gait velocity: Decreased Gait velocity  interpretation: Below normal speed for age/gender General Gait Details: Slow and guarded gait pattern. Pt required consistent assist for balance support and guidance into the bathroom and for correct positioning on the toilet (trying to sit on the toilet sideways)   Stairs            Wheelchair Mobility    Modified Rankin (Stroke Patients Only)       Balance Overall balance assessment: Needs assistance Sitting-balance support: Feet supported;No upper extremity supported Sitting balance-Leahy Scale: Fair   Postural control: Posterior lean Standing balance support: No upper extremity supported;During functional activity Standing balance-Leahy Scale: Poor                      Cognition Arousal/Alertness: Lethargic Behavior During Therapy: Flat affect Overall Cognitive Status:  (Daughter reports she looks a little "off" this morning)       Memory: Decreased recall of precautions;Decreased short-term memory              Exercises      General Comments        Pertinent Vitals/Pain Pain Assessment: Faces Faces Pain Scale: Hurts even more Pain Location: Incision site Pain Descriptors / Indicators: Operative site guarding;Discomfort Pain Intervention(s): Limited activity within patient's tolerance;Monitored during session;Repositioned    Home Living                      Prior Function            PT Goals (current goals can now be found in the care plan section) Acute Rehab PT Goals Patient Stated Goal: to get better PT Goal Formulation: With patient/family Potential to Achieve Goals: Good Progress towards PT goals: Progressing toward goals  Frequency    Min 5X/week      PT Plan Discharge plan needs to be updated    Co-evaluation             End of Session Equipment Utilized During Treatment: Gait belt;Cervical collar Activity Tolerance: Patient limited by fatigue Patient left: in chair;with call bell/phone within  reach;with chair alarm set;with family/visitor present     Time: 9147-82950844-0908 PT Time Calculation (min) (ACUTE ONLY): 24 min  Charges:  $Gait Training: 8-22 mins $Therapeutic Activity: 8-22 mins                    G Codes:      Robin [name], [name] 06/13/2016, 10:37 AM   Robin SlipperLaura Lilliemae Cross, PT, DPT Acute Rehabilitation Services Pager: [phone]

## 2016-06-13 NOTE — Care Management Important Message (Signed)
Important Message  Patient Details  Name: [name] MRN: [id number] Date of Birth: 10-Jul-1946   Medicare Important Message Given:  Yes    [name] 06/13/2016, 11:46 AM

## 2016-06-14 LAB — GLUCOSE, CAPILLARY
GLUCOSE-CAPILLARY: 209 mg/dL — AB (ref 65–99)
GLUCOSE-CAPILLARY: 246 mg/dL — AB (ref 65–99)
Glucose-Capillary: 256 mg/dL — ABNORMAL HIGH (ref 65–99)
Glucose-Capillary: 86 mg/dL (ref 65–99)

## 2016-06-14 NOTE — Clinical Social Work Note (Signed)
Clinical Social Work Assessment  Patient Details  Name: [name] MRN: [id number] Date of Birth: March 06, 1946  Date of referral:  06/14/16               Reason for consult:  Facility Placement, Discharge Planning                Permission sought to share information with:  Family Supports Permission granted to share information::  Yes, Verbal Permission Granted  Name::     Madolyn Frieze   Relationship::  Daughter/Guardian  Contact Information:  [phone]  Housing/Transportation Living arrangements for the past 2 months:  [place], [place] of Information:  Patient Patient Interpreter Needed:  None Criminal Activity/Legal Involvement Pertinent to Current Situation/Hospitalization:  No - Comment as needed Significant Relationships:  Adult Children Lives with:  Adult Children Do you feel safe going back to the place where you live?  No (Pt is in need of a highter level of care) Need for family participation in patient care:  Yes (Comment)  Care giving concerns:  Pt is in need of a higher level of care.  Social Worker assessment / plan:  CSW met with pt and daughter, Barnett Applebaum,  to address consult. Pt has a guardian, Velva Harman, her daughter, however she was at work and gave permission to speak to [place] regarding discharge plan. CSW introduced herself and explained role of social work. CSW also explained the process of discharging to SNF. PT is recommending SNF for STR. Pt's daughter would like for pt to go [facility] as they have made a bed offer. PASARR is pending and it has been referred to a Level II. CSW will continue to follow to facilitate discharge once PASARR is obtained.   Employment status:  Retired Forensic scientist:  Information systems manager, Medicaid [place] PT Recommendations:  [place] / Referral to community resources:  [place]  Patient/Family's Response to care:  Pt and daughter were appreciative of CSW  support.   Patient/Family's Understanding of and Emotional Response to Diagnosis, Current Treatment, and Prognosis:  Pt's daughter understands that pt is in need of a higher level of care.   Emotional Assessment Appearance:  Appears stated age Attitude/Demeanor/Rapport:   (Appropriate) Affect (typically observed):  Pleasant Orientation:  Oriented to Self, Oriented to Place, Oriented to  Time, Oriented to Situation Alcohol / Substance use:  Not Applicable Psych involvement (Current and /or in the community):  No (Comment)  Discharge Needs  Concerns to be addressed:  Adjustment to Illness Readmission within the last 30 days:  No Current discharge risk:  Chronically ill Barriers to Discharge:  [place] (Pasarr)   Darden Dates, LCSW 06/14/2016, 9:15 PM

## 2016-06-14 NOTE — Progress Notes (Signed)
Occupational Therapy Treatment Patient Details Name: [name] MRN: [id number] DOB: Jul 22, 1946 Today's Date: 06/14/2016    History of present illness pt is a 70 y/o female with h/o stroke, schizophrenia, DM, diabetic neuropathy, COPD, admitted with cervical spondylosis and kyphotic deformity, s/p c3-c7 PLA with allograft and instrumented stabilization.   OT comments  This 70 yo female admitted with above presents to acute OT with making progress with basic ADLs. She will continue to benefit from acute OT with follow up OT at SNF. Had to readjust pt's collar she keeps wanting to put her chin down in it (it is the short one, so not sure that there is any more we can do to make this better)  Follow Up Recommendations  SNF;Supervision/Assistance - 24 hour    Equipment Recommendations  None recommended by OT       Precautions / Restrictions Precautions Precautions: Fall;Cervical Required Braces or Orthoses: Cervical Brace Cervical Brace: At all times;Hard collar Restrictions Weight Bearing Restrictions: No       Mobility Bed Mobility Overal bed mobility: Needs Assistance Bed Mobility: Supine to Sit;Sit to Sidelying     Supine to sit: Supervision;HOB elevated (and use of rail)   Sit to sidelying: Supervision    Transfers Overall transfer level: Needs assistance Equipment used: Rolling walker (2 wheeled) Transfers: Sit to/from Stand Sit to Stand: Min guard                  ADL Overall ADL's : Needs assistance/impaired     Grooming: Oral care;Set up;Supervision/safety;Standing               Lower Body Dressing: Min guard;Sit to/from stand   Toilet Transfer: Min guard;Ambulation;RW (bed>sink to groom>hallway 200 feet>bed)                              Cognition   Behavior During Therapy: Flat affect Overall Cognitive Status: Within Functional Limits for tasks assessed                                    Pertinent  Vitals/ Pain       Pain Assessment: Faces Faces Pain Scale: Hurts even more Pain Location: left shoulder one time Pain Descriptors / Indicators: Stabbing;Spasm Pain Intervention(s): Monitored during session;Repositioned (massage)         Frequency  Min 2X/week        Progress Toward Goals  OT Goals(current goals can now be found in the care plan section)  Progress towards OT goals: Progressing toward goals     Plan Discharge plan needs to be updated       End of Session Equipment Utilized During Treatment: Gait belt;Rolling walker;Cervical collar   Activity Tolerance Patient tolerated treatment well   Patient Left in bed;with call bell/phone within reach;with bed alarm set   Nurse Communication          Time: 7829-56211513-1531 OT Time Calculation (min): 18 min  Charges: OT General Charges $OT Visit: 1 Procedure OT Treatments $Self Care/Home Management : 8-22 mins  Evette GeorgesLeonard, Elnathan Fulford Eva [phone] 06/14/2016, 3:46 PM

## 2016-06-14 NOTE — Progress Notes (Signed)
No issues overnight. Pt has minimal complaints.  EXAM:  BP (!) 146/64 (BP Location: Right Arm)   Pulse 75   Temp 98.6 F (37 C) (Oral)   Resp 18   Ht 5\' 5"  (1.651 m)   Wt 57.5 kg (126 lb 12.8 oz)   SpO2 96%   BMI 21.10 kg/m   Awake, alert, oriented  Speech fluent, appropriate  CN grossly intact  5/5 BUE/BLE   IMPRESSION:  70 y.o. female s/p posterior decompression/fusion. Doing well  PLAN: - D/C to SNF when bed available.

## 2016-06-14 NOTE — Discharge Summary (Addendum)
  Physician Discharge Summary  Patient ID: [name] MRN: [id number] DOB/AGE: Apr 16, 1946 70 y.o.  Admit date: 06/09/2016 Discharge date: 06/17/2016  Admission Diagnoses:  Cervical kyphosis Cervical spondylosis with myelopathy  Discharge Diagnoses:  Same Active Problems:   Other secondary kyphosis, cervical region   Discharged Condition: Stable  Hospital Course:  [name] is a 70 y.o. female previously underwent multilevel anterior cervical decompression/fusion. Follow-up in the office demonstrated progressive kyphosis. She therefore was electively admitted after uncomplicated posterior stabilization/fusion. She was at baseline postop, ambulating well, tolerating diet, voiding normally. She was evaluated by PT/OT, and after discussion with her and her daughters, it was felt she would be best at a SNF prior to return home. It took a few days for placement to occur, she remained stable.  Treatments: Surgery - C3-C7 lateral mass fusion  Discharge Exam: Blood pressure (!) 142/78, pulse 72, temperature 98 F (36.7 C), temperature source Oral, resp. rate 19, height 5\' 5"  (1.651 m), weight 57.5 kg (126 lb 12.8 oz), SpO2 98 %. Awake, alert, oriented Speech fluent, appropriate CN grossly intact Good strength BUE/bLE Wound c/d/i  Disposition: 03-Skilled Nursing Facility     Medication List    TAKE these medications   albuterol (2.5 MG/3ML) 0.083% nebulizer solution Commonly known as:  PROVENTIL Take 2.5 mg by nebulization every 6 (six) hours as needed for wheezing or shortness of breath.   aspirin EC 81 MG tablet Take 81 mg by mouth daily.   ENSURE Take 237 mLs by mouth 3 (three) times daily between meals.   meclizine 25 MG tablet Commonly known as:  ANTIVERT Take 25 mg by mouth 3 (three) times daily as needed for dizziness.   metFORMIN 500 MG tablet Commonly known as:  GLUCOPHAGE Take 500 mg by mouth 2 (two) times daily with a meal.   methocarbamol  500 MG tablet Commonly known as:  ROBAXIN Take 1 tablet (500 mg total) by mouth every 6 (six) hours as needed for muscle spasms.   oxyCODONE-acetaminophen 5-325 MG tablet Commonly known as:  PERCOCET/ROXICET Take 1-2 tablets by mouth every 4 (four) hours as needed for moderate pain.   traZODone 150 MG tablet Commonly known as:  DESYREL Take 150 mg by mouth at bedtime.   ziprasidone 80 MG capsule Commonly known as:  GEODON Take 80 mg by mouth at bedtime.       Contact information for follow-up providers    Malky Rudzinski, C, MD Follow up in 10 day(s).   Specialty:  Neurosurgery Why:  For staple removal / wound check Contact information: [address] [place] [address] [phone]            Contact information for after-discharge care    Destination    [facility] .   Specialty:  Skilled Nursing Facility Contact information: [address] [place] [id number] [phone]                  Signed: Lisbeth RenshawUNDKUMAR, Geronimo Diliberto, C 06/17/2016, 10:14 AM

## 2016-06-14 NOTE — Progress Notes (Signed)
Physical Therapy Treatment Patient Details Name: [name] MRN: [id number] DOB: 09/19/1945 Today's Date: 06/14/2016    History of Present Illness pt is a 70 y/o female with h/o stroke, schizophrenia, DM, diabetic neuropathy, COPD, admitted with cervical spondylosis and kyphotic deformity, s/p c3-c7 PLA with allograft and instrumented stabilization.    PT Comments    Pt progressing towards physical therapy goals. Was able to improve ambulation distance this session however reports significant fatigue at the end of gait training. Pt continues to require cues for proper walker placement and general cervical precautions during functional mobility. Will continue to follow.   Follow Up Recommendations  SNF;Supervision/Assistance - 24 hour     Equipment Recommendations  None recommended by PT    Recommendations for Other Services       Precautions / Restrictions Precautions Precautions: Fall;Cervical Required Braces or Orthoses: Cervical Brace Cervical Brace: At all times;Hard collar Restrictions Weight Bearing Restrictions: No    Mobility  Bed Mobility Overal bed mobility: Needs Assistance Bed Mobility: Rolling;Sit to Sidelying Rolling: Supervision       Sit to sidelying: Min assist General bed mobility comments: Assist to elevate BLE's up onto bed when transitioning to sidelying  Transfers Overall transfer level: Needs assistance Equipment used: Rolling walker (2 wheeled) Transfers: Sit to/from Stand Sit to Stand: Min guard         General transfer comment: Hands-on guarding required for balance support. Pt was cued for hand placement on seated surface for safety.   Ambulation/Gait Ambulation/Gait assistance: Min assist Ambulation Distance (Feet): 150 Feet Assistive device: Rolling walker (2 wheeled) Gait Pattern/deviations: Step-through pattern;Decreased stride length;Trunk flexed Gait velocity: Decreased Gait velocity interpretation: Below normal speed  for age/gender General Gait Details: Slow and guarded gait pattern. Pt required consistent assist for balance support and guidance with the RW.    Stairs            Wheelchair Mobility    Modified Rankin (Stroke Patients Only)       Balance Overall balance assessment: Needs assistance Sitting-balance support: Feet supported;No upper extremity supported Sitting balance-Leahy Scale: Fair     Standing balance support: No upper extremity supported;During functional activity Standing balance-Leahy Scale: Poor                      Cognition Arousal/Alertness: Lethargic Behavior During Therapy: Flat affect         Memory: Decreased recall of precautions;Decreased short-term memory              Exercises      General Comments        Pertinent Vitals/Pain Pain Assessment: Faces Faces Pain Scale: Hurts little more Pain Location: Shoulders and incisional site Pain Descriptors / Indicators: Operative site guarding;Discomfort Pain Intervention(s): Limited activity within patient's tolerance;Monitored during session;Repositioned    Home Living                      Prior Function            PT Goals (current goals can now be found in the care plan section) Acute Rehab PT Goals Patient Stated Goal: to get better PT Goal Formulation: With patient/family Potential to Achieve Goals: Good Progress towards PT goals: Progressing toward goals    Frequency    Min 5X/week      PT Plan Discharge plan needs to be updated    Co-evaluation  End of Session Equipment Utilized During Treatment: Gait belt;Cervical collar Activity Tolerance: Patient limited by fatigue Patient left: in chair;with call bell/phone within reach;with chair alarm set;with family/visitor present     Time: [id number] PT Time Calculation (min) (ACUTE ONLY): 15 min  Charges:  $Gait Training: 8-22 mins                    G Codes:      Conni SlipperKirkman,  Stevenson Windmiller 06/14/2016, 11:42 AM  Conni SlipperLaura Sami Froh, PT, DPT Acute Rehabilitation Services Pager: [phone]

## 2016-06-15 LAB — GLUCOSE, CAPILLARY
GLUCOSE-CAPILLARY: 192 mg/dL — AB (ref 65–99)
GLUCOSE-CAPILLARY: 252 mg/dL — AB (ref 65–99)
Glucose-Capillary: 245 mg/dL — ABNORMAL HIGH (ref 65–99)
Glucose-Capillary: 115 mg/dL — ABNORMAL HIGH (ref 65–99)

## 2016-06-15 NOTE — Clinical Social Work Note (Signed)
CSW is still waiting for patient's Level II PASRR [facility].   Roddie McBryant Juvia Aerts MSW, [place]CS[place], Miguel BarreraLCASA, [phone]

## 2016-06-15 NOTE — Progress Notes (Signed)
No issues overnight. Pt has no complaints this am.  EXAM:  BP (!) 159/69 (BP Location: Left Arm)   Pulse 65   Temp 98.1 F (36.7 C) (Oral)   Resp 18   Ht 5\' 5"  (1.651 m)   Wt 57.5 kg (126 lb 12.8 oz)   SpO2 99%   BMI 21.10 kg/m   Awake, alert, oriented  Speech fluent, appropriate  CN grossly intact  Good strength  IMPRESSION:  70 y.o. female s/p posterior cervical stabilization, doing well  PLAN: - Plan on d/c to SNF when bed available

## 2016-06-15 NOTE — Progress Notes (Signed)
Physical Therapy Treatment Patient Details Name: [name] MRN: [id number] DOB: 1945-10-04 Today's Date: 06/15/2016    History of Present Illness pt is a 70 y/o female with h/o stroke, schizophrenia, DM, diabetic neuropathy, COPD, admitted with cervical spondylosis and kyphotic deformity, s/p c3-c7 PLA with allograft and instrumented stabilization.    PT Comments    Pt progressing towards physical therapy goals. Continues to have bilateral shoulder pain with RW use however was able to progress gait distance this session. Will continue to follow and progress as able per POC.   Follow Up Recommendations  SNF;Supervision/Assistance - 24 hour     Equipment Recommendations  None recommended by PT    Recommendations for Other Services       Precautions / Restrictions Precautions Precautions: Fall;Cervical Required Braces or Orthoses: Cervical Brace Cervical Brace: At all times;Hard collar Restrictions Weight Bearing Restrictions: No    Mobility  Bed Mobility Overal bed mobility: Needs Assistance Bed Mobility: Supine to Sit   Sidelying to sit: Min guard Supine to sit: Supervision;HOB elevated (and use of rail)     General bed mobility comments: VC's for sequencing and hands-on guarding as pt transitioned to EOB.   Transfers Overall transfer level: Needs assistance Equipment used: Rolling walker (2 wheeled) Transfers: Sit to/from Stand Sit to Stand: Min guard         General transfer comment: Hands-on guarding required for balance support. Pt was cued for hand placement on seated surface for safety.   Ambulation/Gait Ambulation/Gait assistance: Min assist Ambulation Distance (Feet): 200 Feet Assistive device: Rolling walker (2 wheeled) Gait Pattern/deviations: Step-through pattern;Decreased stride length;Trunk flexed Gait velocity: Decreased Gait velocity interpretation: Below normal speed for age/gender General Gait Details: Slow and guarded gait pattern.  Pt required consistent assist for balance support and guidance with the RW.    Stairs            Wheelchair Mobility    Modified Rankin (Stroke Patients Only)       Balance Overall balance assessment: Needs assistance Sitting-balance support: Feet supported;No upper extremity supported Sitting balance-Leahy Scale: Fair   Postural control: Posterior lean Standing balance support: No upper extremity supported;During functional activity Standing balance-Leahy Scale: Poor                      Cognition Arousal/Alertness: Awake/alert Behavior During Therapy: Flat affect Overall Cognitive Status: Within Functional Limits for tasks assessed       Memory: Decreased recall of precautions;Decreased short-term memory              Exercises      General Comments        Pertinent Vitals/Pain Pain Assessment: Faces Faces Pain Scale: Hurts little more Pain Location: Shoulders with RW use` Pain Descriptors / Indicators: Operative site guarding;Discomfort Pain Intervention(s): Limited activity within patient's tolerance;Monitored during session;Repositioned    Home Living                      Prior Function            PT Goals (current goals can now be found in the care plan section) Acute Rehab PT Goals Patient Stated Goal: to get better PT Goal Formulation: With patient/family Potential to Achieve Goals: Good Progress towards PT goals: Progressing toward goals    Frequency    Min 5X/week      PT Plan Current plan remains appropriate    Co-evaluation  End of Session Equipment Utilized During Treatment: Gait belt;Cervical collar Activity Tolerance: Patient limited by fatigue Patient left: in chair;with call bell/phone within reach;with chair alarm set;with family/visitor present     Time: 1610-96041058-1115 PT Time Calculation (min) (ACUTE ONLY): 17 min  Charges:  $Gait Training: 8-22 mins                    G Codes:       Marylynn PearsonLaura D Imogean Ciampa 06/15/2016, 11:57 AM  Conni SlipperLaura Lonisha Bobby, PT, DPT Acute Rehabilitation Services Pager: [phone]

## 2016-06-16 LAB — GLUCOSE, CAPILLARY
GLUCOSE-CAPILLARY: 152 mg/dL — AB (ref 65–99)
Glucose-Capillary: 209 mg/dL — ABNORMAL HIGH (ref 65–99)
Glucose-Capillary: 251 mg/dL — ABNORMAL HIGH (ref 65–99)
Glucose-Capillary: 216 mg/dL — ABNORMAL HIGH (ref 65–99)

## 2016-06-16 MED ORDER — INSULIN GLARGINE 100 UNIT/ML ~~LOC~~ SOLN
10.0000 [IU] | Freq: Every day | SUBCUTANEOUS | Status: DC
  Administered 2016-06-16 – 2016-06-20 (×5): 10 [IU] via SUBCUTANEOUS
  Filled 2016-06-16 (×6): qty 0.1

## 2016-06-16 MED ORDER — ALPRAZOLAM 0.5 MG PO TABS
1.0000 mg | ORAL_TABLET | Freq: Three times a day (TID) | ORAL | Status: DC | PRN
  Administered 2016-06-16 – 2016-06-20 (×4): 1 mg via ORAL
  Filled 2016-06-16 (×4): qty 2

## 2016-06-16 NOTE — Progress Notes (Signed)
No issues overnight. Doing well.  EXAM:  BP (!) 144/72 (BP Location: Left Arm)   Pulse 72   Temp 98.9 F (37.2 C) (Oral)   Resp 19   Ht 5\' 5"  (1.651 m)   Wt 57.5 kg (126 lb 12.8 oz)   SpO2 99%   BMI 21.10 kg/m   Awake, alert, oriented  Speech fluent, appropriate  CN grossly intact  5/5 BUE/BLE x mild grip weakness Wound c/d/i  IMPRESSION:  70 y.o. female s/p posterior cervical stabilization, doing well. Ready for d/c to SNF when bed available  PLAN: - Cont therapy - D/C when bed available

## 2016-06-16 NOTE — Care Management Note (Signed)
Case Management Note  Patient Details  Name: [name] MRN: [id number] Date of Birth: 02/14/1946  Subjective/Objective:                    Action/Plan: [facility] PAS[facility]. CM following for further d/c needs.   Expected Discharge Date:                  Expected Discharge Plan:  Home w Home Health Services  In-House Referral:     Discharge planning Services     Post Acute Care Choice:    Choice offered to:     DME Arranged:    DME Agency:     HH Arranged:    HH Agency:     Status of Service:  In process, will continue to follow  If discussed at Long Length of Stay Meetings, dates discussed:    Additional Comments:  [name], RN 06/16/2016, 11:58 AM

## 2016-06-16 NOTE — Progress Notes (Signed)
Physical Therapy Treatment Patient Details Name: [name] MRN: [id number] DOB: Apr 02, 1946 Today's Date: 06/16/2016    History of Present Illness pt is a 70 y/o female with h/o stroke, schizophrenia, DM, diabetic neuropathy, COPD, admitted with cervical spondylosis and kyphotic deformity, s/p c3-c7 PLA with allograft and instrumented stabilization.    PT Comments    Pt progressing towards physical therapy goals. Was able to perform transfers and ambulation with hands-on guarding for safety and maintenance of back precautions. Pt continues to complain of bilateral shoulder pain during gait training, likely from RW use. RW lowered for comfort however may need to be further adjusted next session.   Follow Up Recommendations  SNF;Supervision/Assistance - 24 hour     Equipment Recommendations  None recommended by PT    Recommendations for Other Services       Precautions / Restrictions Precautions Precautions: Fall;Cervical Required Braces or Orthoses: Cervical Brace Cervical Brace: At all times;Hard collar Restrictions Weight Bearing Restrictions: No    Mobility  Bed Mobility Overal bed mobility: Needs Assistance Bed Mobility: Supine to Sit;Sit to Supine     Supine to sit: Supervision;HOB elevated (and use of rail) Sit to supine: Min guard   General bed mobility comments: VC's for sequencing and hands-on guarding as pt transitioned to/from EOB. Although pt was cued for log roll, helicopter technique was again utilized per pt preference.   Transfers Overall transfer level: Needs assistance Equipment used: Rolling walker (2 wheeled) Transfers: Sit to/from Stand Sit to Stand: Min guard         General transfer comment: Hands-on guarding required for balance support. Pt was cued for hand placement on seated surface for safety.   Ambulation/Gait Ambulation/Gait assistance: Min guard Ambulation Distance (Feet): 200 Feet Assistive device: Rolling walker (2  wheeled) Gait Pattern/deviations: Step-through pattern;Decreased stride length Gait velocity: Decreased Gait velocity interpretation: Below normal speed for age/gender General Gait Details: Slow and guarded gait pattern. Pt required consistent cues for upright posture and safety, and occasional assist for balance support and guidance with the RW.    Stairs            Wheelchair Mobility    Modified Rankin (Stroke Patients Only)       Balance Overall balance assessment: Needs assistance Sitting-balance support: Feet supported;No upper extremity supported Sitting balance-Leahy Scale: Fair     Standing balance support: No upper extremity supported;During functional activity Standing balance-Leahy Scale: Poor                      Cognition Arousal/Alertness: Awake/alert Behavior During Therapy: Flat affect Overall Cognitive Status: Within Functional Limits for tasks assessed       Memory: Decreased recall of precautions;Decreased short-term memory              Exercises      General Comments        Pertinent Vitals/Pain Pain Assessment: Faces Faces Pain Scale: Hurts a little bit Pain Location: Shoulders with RW use Pain Descriptors / Indicators: Operative site guarding;Discomfort Pain Intervention(s): Limited activity within patient's tolerance;Monitored during session;Repositioned    Home Living                      Prior Function            PT Goals (current goals can now be found in the care plan section) Acute Rehab PT Goals Patient Stated Goal: to get better PT Goal Formulation: With patient Potential to Achieve  Goals: Good Progress towards PT goals: Progressing toward goals    Frequency    Min 5X/week      PT Plan Current plan remains appropriate    Co-evaluation             End of Session Equipment Utilized During Treatment: Gait belt;Cervical collar Activity Tolerance: Patient limited by fatigue Patient  left: with call bell/phone within reach;in bed;with bed alarm set     Time: 1610-96040910-0934 PT Time Calculation (min) (ACUTE ONLY): 24 min  Charges:  $Gait Training: 23-37 mins                    G Codes:      [name] 06/16/2016, 12:03 PM   Robin SlipperLaura Alexandrya Cross, PT, DPT Acute Rehabilitation Services Pager: [phone]

## 2016-06-16 NOTE — Progress Notes (Signed)
Inpatient Diabetes Program Recommendations  AACE/ADA: New Consensus Statement on Inpatient Glycemic Control (2015)  Target Ranges:  Prepandial:   less than 140 mg/dL      Peak postprandial:   less than 180 mg/dL (1-2 hours)      Critically ill patients:  140 - 180 mg/dL   Lab Results  Component Value Date   GLUCAP 209 (H) 06/16/2016   HGBA1C 7.0 (H) 06/08/2016    Review of Glycemic Control:  Results for Lurline DelWARFIELD, Jacynda D (MRN [id number]) as of 06/16/2016 14:41  Ref. Range 06/15/2016 12:19 06/15/2016 16:36 06/15/2016 21:34 06/16/2016 07:26 06/16/2016 11:38  Glucose-Capillary Latest Ref Range: 65 - 99 mg/dL 409245 (H) 811252 (H) 914115 (H) 152 (H) 209 (H)   Inpatient Diabetes Program Recommendations:    Consider adding Lantus 10 units daily while patient is in the hospital.   Thanks, Beryl MeagerJenny Ernest Popowski, RN, BC-ADM Inpatient Diabetes Coordinator Pager [phone] (8a-5p)

## 2016-06-16 NOTE — Progress Notes (Addendum)
Patient having severe anxiety at this time stating "I can't breathe" complaining the aspen collar being too tight. Patient repositioned several times, collar readjusted, deep breathing and imagery used, oxygen placed to help patient calm. On call paged [phone] requesting order. Waiting return page. Return call [phone], 1mg  xanax TID prn ordered. Continue to monitor patient.

## 2016-06-17 LAB — GLUCOSE, CAPILLARY
GLUCOSE-CAPILLARY: 115 mg/dL — AB (ref 65–99)
Glucose-Capillary: 156 mg/dL — ABNORMAL HIGH (ref 65–99)
Glucose-Capillary: 104 mg/dL — ABNORMAL HIGH (ref 65–99)
Glucose-Capillary: 198 mg/dL — ABNORMAL HIGH (ref 65–99)

## 2016-06-17 NOTE — Progress Notes (Signed)
Physical Therapy Treatment Patient Details Name: [name] D Florido MRN: [id number] DOB: 1946/04/28 Today's Date: 06/17/2016    History of Present Illness pt is a 70 y/o female with h/o stroke, schizophrenia, DM, diabetic neuropathy, COPD, admitted with cervical spondylosis and kyphotic deformity, s/p c3-c7 PLA with allograft and instrumented stabilization.    PT Comments    Pt progressing towards physical therapy goals. Was able to perform transfers and ambulation with gross supervision to min guard for balance support and safety. Increased time required for all functional mobility.  Follow Up Recommendations  SNF;Supervision/Assistance - 24 hour     Equipment Recommendations  None recommended by PT    Recommendations for Other Services       Precautions / Restrictions Precautions Precautions: Fall;Cervical Required Braces or Orthoses: Cervical Brace Cervical Brace: At all times;Hard collar Restrictions Weight Bearing Restrictions: No    Mobility  Bed Mobility Overal bed mobility: Needs Assistance Bed Mobility: Supine to Sit;Sit to Supine     Supine to sit: Supervision;HOB elevated (and use of rail) Sit to supine: Supervision;HOB elevated   General bed mobility comments: VC's for sequencing and clos supervision for safety as pt transitioned to/from EOB. Although pt was cued for log roll, helicopter technique was again utilized per pt preference.   Transfers Overall transfer level: Needs assistance Equipment used: Rolling walker (2 wheeled) Transfers: Sit to/from Stand Sit to Stand: Min guard         General transfer comment: Hands-on guarding required for balance support. Pt was cued for hand placement on seated surface for safety.   Ambulation/Gait Ambulation/Gait assistance: Min guard Ambulation Distance (Feet): 200 Feet Assistive device: Rolling walker (2 wheeled) Gait Pattern/deviations: Step-through pattern;Decreased stride length Gait velocity:  Decreased Gait velocity interpretation: Below normal speed for age/gender General Gait Details: Slow and guarded gait pattern. Pt required consistent cues for upright posture and safety, and occasional assist for guidance with the RW.    Stairs            Wheelchair Mobility    Modified Rankin (Stroke Patients Only)       Balance Overall balance assessment: Needs assistance Sitting-balance support: No upper extremity supported;Feet supported Sitting balance-Leahy Scale: Fair     Standing balance support: No upper extremity supported;During functional activity Standing balance-Leahy Scale: Poor                      Cognition Arousal/Alertness: Awake/alert Behavior During Therapy: Flat affect Overall Cognitive Status: Within Functional Limits for tasks assessed       Memory: Decreased recall of precautions;Decreased short-term memory              Exercises      General Comments        Pertinent Vitals/Pain Pain Assessment: Faces Faces Pain Scale: No hurt    Home Living                      Prior Function            PT Goals (current goals can now be found in the care plan section) Acute Rehab PT Goals Patient Stated Goal: to get better PT Goal Formulation: With patient Potential to Achieve Goals: Good Progress towards PT goals: Progressing toward goals    Frequency    Min 5X/week      PT Plan Current plan remains appropriate    Co-evaluation             End  of Session Equipment Utilized During Treatment: Gait belt;Cervical collar Activity Tolerance: Patient limited by fatigue Patient left: with call bell/phone within reach;in bed;with bed alarm set     Time: [id number] PT Time Calculation (min) (ACUTE ONLY): 23 min  Charges:  $Gait Training: 8-22 mins $Therapeutic Activity: 8-22 mins                    G Codes:      Marylynn PearsonLaura D Aarya Robinson 06/17/2016, 2:01 PM   Conni SlipperLaura Nevea Spiewak, PT, DPT Acute Rehabilitation  Services Pager: [phone]

## 2016-06-17 NOTE — Care Management Important Message (Signed)
Important Message  Patient Details  Name: [name] MRN: [id number] Date of Birth: 07-15-1946   Medicare Important Message Given:  Yes    Dorena BodoIris Noris Kulinski 06/17/2016, 2:43 PM

## 2016-06-17 NOTE — Progress Notes (Signed)
CSW viewed  PASRR System, PASRR# has not been assigned to pt and system is still showing under manual review. Once number is assigned CSW will [facility].   Andriel Omalley B. Reine JustBrown, LCSWA [phone]

## 2016-06-18 LAB — GLUCOSE, CAPILLARY
GLUCOSE-CAPILLARY: 134 mg/dL — AB (ref 65–99)
GLUCOSE-CAPILLARY: 162 mg/dL — AB (ref 65–99)
GLUCOSE-CAPILLARY: 82 mg/dL (ref 65–99)
Glucose-Capillary: 147 mg/dL — ABNORMAL HIGH (ref 65–99)

## 2016-06-18 NOTE — Clinical Social Work Note (Signed)
PASARR still pending. Patient cannot discharge to SNF until obtained. RN updated.  Charlynn CourtSarah Campbell Agramonte, CSW [phone]

## 2016-06-18 NOTE — Clinical Documentation Improvement (Signed)
Upon initial assessment pt noted without iv access .information given in pass on report by  am nurse shawn  He states md is aware( no documentation noted )   Pt is not getting continuos fluids as she is tolerated oral fluids well .

## 2016-06-18 NOTE — Progress Notes (Signed)
Patient ID: [name], female   DOB: 25-May-1946, 70 y.o.   MRN: [id number] Subjective:  The patient is alert and pleasant. She is in no apparent distress. She is wearing her cervical collar.  Objective: Vital signs in last 24 hours: Temp:  [97.9 F (36.6 C)-98.6 F (37 C)] 98.2 F (36.8 C) (11/18 0501) Pulse Rate:  [54-72] 58 (11/18 0501) Resp:  [18-20] 20 (11/18 0501) BP: (128-151)/(60-82) 145/60 (11/18 0501) SpO2:  [98 %-100 %] 100 % (11/18 0501)  Intake/Output from previous day: 11/17 0701 - 11/18 0700 In: 720 [P.O.:720] Out: -  Intake/Output this shift: No intake/output data recorded.  Physical exam patient is alert and pleasant. She is moving all 4 extremities well.  Lab Results: No results for input(s): WBC, HGB, HCT, PLT in the last 72 hours. BMET No results for input(s): NA, K, CL, CO2, GLUCOSE, BUN, CREATININE, CALCIUM in the last 72 hours.  Studies/Results: No results found.  Assessment/Plan: Postop day #9: We are awaiting skilled nursing facility placement.  LOS: 9 days     [name] 06/18/2016, 7:41 AM

## 2016-06-19 LAB — GLUCOSE, CAPILLARY
GLUCOSE-CAPILLARY: 148 mg/dL — AB (ref 65–99)
Glucose-Capillary: 88 mg/dL (ref 65–99)
Glucose-Capillary: 92 mg/dL (ref 65–99)
Glucose-Capillary: 125 mg/dL — ABNORMAL HIGH (ref 65–99)

## 2016-06-19 NOTE — Progress Notes (Signed)
No acute events Exam stable Awaiting placement

## 2016-06-19 NOTE — Progress Notes (Signed)
No IV site present.

## 2016-06-20 LAB — GLUCOSE, CAPILLARY
GLUCOSE-CAPILLARY: 136 mg/dL — AB (ref 65–99)
GLUCOSE-CAPILLARY: 118 mg/dL — AB (ref 65–99)
Glucose-Capillary: 77 mg/dL (ref 65–99)

## 2016-06-20 NOTE — Progress Notes (Signed)
Physical Therapy Treatment Patient Details Name: [name] D Seyller MRN: [id number] DOB: 1945-12-22 Today's Date: 06/20/2016    History of Present Illness pt is a 70 y/o female with h/o stroke, schizophrenia, DM, diabetic neuropathy, COPD, admitted with cervical spondylosis and kyphotic deformity, s/p c3-c7 PLA with allograft and instrumented stabilization.    PT Comments    The pt is making progress toward goals and increased her gait distance today.  Pt demonstrates decreased strength and endurance during ambulation and requires assistance to navigate the rolling walker.  Pt exhibited SOB, but her O2 saturation was at 98% on RA when checked.  She will benefit from further PT to increase her strength and endurance.  Continue with POC.  Follow Up Recommendations  SNF;Supervision/Assistance - 24 hour     Equipment Recommendations  None recommended by PT    Recommendations for Other Services       Precautions / Restrictions Precautions Precautions: Fall;Cervical Required Braces or Orthoses: Cervical Brace Cervical Brace: At all times;Hard collar Restrictions Weight Bearing Restrictions: No    Mobility  Bed Mobility Overal bed mobility: Needs Assistance Bed Mobility: Supine to Sit;Sit to Supine     Supine to sit: Supervision;HOB elevated Sit to supine: Min guard;HOB elevated (Assistance to lift LEs on bed.)   General bed mobility comments: VC's for sequencing and close supervision for safety as pt transitioned to/from EOB. Although pt was cued for log roll, she used sit pivot technique.  Transfers Overall transfer level: Needs assistance Equipment used: Rolling walker (2 wheeled) Transfers: Sit to/from Stand   Stand pivot transfers: Supervision       General transfer comment: Pt impulsive and needed verbal cues for safety, but no LOB.  Ambulation/Gait Ambulation/Gait assistance: Min guard Ambulation Distance (Feet): 240 Feet Assistive device: Rolling walker (2  wheeled) Gait Pattern/deviations: Step-through pattern;Decreased stride length;Drifts right/left Gait velocity: Decreased Gait velocity interpretation: Below normal speed for age/gender General Gait Details: Slow and guarded gait pattern. Pt required assist to guide and turn RW and occasionally drifted left/right.   Stairs            Wheelchair Mobility    Modified Rankin (Stroke Patients Only)       Balance     Sitting balance-Leahy Scale: Fair     Standing balance support: Bilateral upper extremity supported;During functional activity Standing balance-Leahy Scale: Fair Standing balance comment: No LOB while standing within RW.                    Cognition Arousal/Alertness: Awake/alert Behavior During Therapy: WFL for tasks assessed/performed Overall Cognitive Status: Within Functional Limits for tasks assessed       Memory: Decreased recall of precautions;Decreased short-term memory              Exercises      General Comments        Pertinent Vitals/Pain Pain Assessment: No/denies pain Pain Score: 0-No pain    Home Living                      Prior Function            PT Goals (current goals can now be found in the care plan section) Acute Rehab PT Goals Patient Stated Goal: to get better PT Goal Formulation: With patient Potential to Achieve Goals: Good Progress towards PT goals: Progressing toward goals    Frequency    Min 5X/week      PT Plan Current plan remains  appropriate    Co-evaluation             End of Session Equipment Utilized During Treatment: Gait belt;Cervical collar Activity Tolerance: Patient limited by fatigue Patient left: with call bell/phone within reach;in bed;with bed alarm set     Time: 1441-1456 PT Time Calculation (min) (ACUTE ONLY): 15 min  Charges:  $Gait Training: 8-22 mins                    G Codes:      Cathleen CortiMary Aedon Deason 06/20/2016, 3:49 PM Zelphia CairoMary R. Chipper Koudelka,  Leda GauzeSPTA [phone]

## 2016-06-20 NOTE — Plan of Care (Signed)
Problem: Education: Goal: Knowledge of [facility] Education information/materials will improve Outcome: Progressing POC reviewed with pt.   

## 2016-06-21 DIAGNOSIS — F203 Undifferentiated schizophrenia: Secondary | ICD-10-CM | POA: Diagnosis not present

## 2016-06-21 DIAGNOSIS — G4700 Insomnia, unspecified: Secondary | ICD-10-CM | POA: Diagnosis not present

## 2016-06-21 DIAGNOSIS — D638 Anemia in other chronic diseases classified elsewhere: Secondary | ICD-10-CM | POA: Diagnosis not present

## 2016-06-21 DIAGNOSIS — M4322 Fusion of spine, cervical region: Secondary | ICD-10-CM | POA: Diagnosis not present

## 2016-06-21 DIAGNOSIS — M4012 Other secondary kyphosis, cervical region: Secondary | ICD-10-CM | POA: Diagnosis not present

## 2016-06-21 DIAGNOSIS — R42 Dizziness and giddiness: Secondary | ICD-10-CM | POA: Diagnosis not present

## 2016-06-21 DIAGNOSIS — I69391 Dysphagia following cerebral infarction: Secondary | ICD-10-CM | POA: Diagnosis not present

## 2016-06-21 DIAGNOSIS — M4712 Other spondylosis with myelopathy, cervical region: Secondary | ICD-10-CM | POA: Diagnosis not present

## 2016-06-21 DIAGNOSIS — G4709 Other insomnia: Secondary | ICD-10-CM | POA: Diagnosis not present

## 2016-06-21 DIAGNOSIS — E440 Moderate protein-calorie malnutrition: Secondary | ICD-10-CM | POA: Diagnosis not present

## 2016-06-21 DIAGNOSIS — R1312 Dysphagia, oropharyngeal phase: Secondary | ICD-10-CM | POA: Diagnosis not present

## 2016-06-21 DIAGNOSIS — R1319 Other dysphagia: Secondary | ICD-10-CM | POA: Diagnosis not present

## 2016-06-21 DIAGNOSIS — R531 Weakness: Secondary | ICD-10-CM | POA: Diagnosis not present

## 2016-06-21 DIAGNOSIS — R2689 Other abnormalities of gait and mobility: Secondary | ICD-10-CM | POA: Diagnosis not present

## 2016-06-21 DIAGNOSIS — N183 Chronic kidney disease, stage 3 (moderate): Secondary | ICD-10-CM | POA: Diagnosis not present

## 2016-06-21 DIAGNOSIS — M4302 Spondylolysis, cervical region: Secondary | ICD-10-CM | POA: Diagnosis not present

## 2016-06-21 DIAGNOSIS — D62 Acute posthemorrhagic anemia: Secondary | ICD-10-CM | POA: Diagnosis not present

## 2016-06-21 DIAGNOSIS — K5909 Other constipation: Secondary | ICD-10-CM | POA: Diagnosis not present

## 2016-06-21 DIAGNOSIS — E1122 Type 2 diabetes mellitus with diabetic chronic kidney disease: Secondary | ICD-10-CM | POA: Diagnosis not present

## 2016-06-21 DIAGNOSIS — F200 Paranoid schizophrenia: Secondary | ICD-10-CM | POA: Diagnosis not present

## 2016-06-21 DIAGNOSIS — R2681 Unsteadiness on feet: Secondary | ICD-10-CM | POA: Diagnosis not present

## 2016-06-21 DIAGNOSIS — Z4889 Encounter for other specified surgical aftercare: Secondary | ICD-10-CM | POA: Diagnosis not present

## 2016-06-21 DIAGNOSIS — H8113 Benign paroxysmal vertigo, bilateral: Secondary | ICD-10-CM | POA: Diagnosis not present

## 2016-06-21 DIAGNOSIS — E1100 Type 2 diabetes mellitus with hyperosmolarity without nonketotic hyperglycemic-hyperosmolar coma (NKHHC): Secondary | ICD-10-CM | POA: Diagnosis not present

## 2016-06-21 DIAGNOSIS — M40292 Other kyphosis, cervical region: Secondary | ICD-10-CM | POA: Diagnosis not present

## 2016-06-21 DIAGNOSIS — F0150 Vascular dementia without behavioral disturbance: Secondary | ICD-10-CM | POA: Diagnosis not present

## 2016-06-21 DIAGNOSIS — Z79899 Other long term (current) drug therapy: Secondary | ICD-10-CM | POA: Diagnosis not present

## 2016-06-21 DIAGNOSIS — I10 Essential (primary) hypertension: Secondary | ICD-10-CM | POA: Diagnosis not present

## 2016-06-21 DIAGNOSIS — M6281 Muscle weakness (generalized): Secondary | ICD-10-CM | POA: Diagnosis not present

## 2016-06-21 LAB — GLUCOSE, CAPILLARY
GLUCOSE-CAPILLARY: 125 mg/dL — AB (ref 65–99)
GLUCOSE-CAPILLARY: 95 mg/dL (ref 65–99)
GLUCOSE-CAPILLARY: 147 mg/dL — AB (ref 65–99)

## 2016-06-21 MED ORDER — OXYCODONE-ACETAMINOPHEN 5-325 MG PO TABS: 1.0000 | ORAL_TABLET | ORAL | 0 refills | Status: DC | PRN

## 2016-06-21 NOTE — Progress Notes (Signed)
[facility] arrived to transport patient to [facility]. Report called and given to admitting nurse at [facility]

## 2016-06-21 NOTE — Progress Notes (Addendum)
Pt for d/c [facility] today via ambulance. PASRR number received [id number] F. Will need updated dc summary. SW [facility] are able to accept pt today. Attending RN and MD updated.    Update: Pt's dtr Gina notified of d/c and [facility]. [facility]. [facility] arranged for transport. RN to call report. CSW signing off at d/c.   Dellie BurnsJosie Safiyya Stokes, MSW, LCSW [phone] (coverage)

## 2016-06-21 NOTE — Care Management Note (Signed)
Case Management Note  Patient Details  Name: [name] MRN: [id number] Date of Birth: 06/16/1946  Subjective/Objective:                    Action/Plan: [facility]. No further needs per CM.   Expected Discharge Date:                  Expected Discharge Plan:  Home w Home Health Services  In-House Referral:  Clinical Social Work  Discharge planning Services  CM Consult  Post Acute Care Choice:    Choice offered to:     DME Arranged:    DME Agency:     HH Arranged:    HH Agency:     Status of Service:  Completed, signed off  If discussed at [employer] Meetings, dates discussed:    Additional Comments:  Kermit BaloKelli F Bingham Millette, RN 06/21/2016, 10:38 AM

## 2016-06-21 NOTE — Care Management Important Message (Signed)
Important Message  Patient Details  Name: [name] MRN: [id number] Date of Birth: 1946-02-10   Medicare Important Message Given:  Yes    Dorena BodoIris Kainat Pizana 06/21/2016, 9:51 AM

## 2016-06-22 ENCOUNTER — Encounter: Payer: Self-pay | Admitting: Adult Health

## 2016-06-22 ENCOUNTER — Non-Acute Institutional Stay (SKILLED_NURSING_FACILITY): Payer: Medicare Other | Admitting: Adult Health

## 2016-06-22 DIAGNOSIS — G4700 Insomnia, unspecified: Secondary | ICD-10-CM | POA: Diagnosis not present

## 2016-06-22 DIAGNOSIS — M4012 Other secondary kyphosis, cervical region: Secondary | ICD-10-CM

## 2016-06-22 DIAGNOSIS — D62 Acute posthemorrhagic anemia: Secondary | ICD-10-CM | POA: Diagnosis not present

## 2016-06-22 DIAGNOSIS — R42 Dizziness and giddiness: Secondary | ICD-10-CM

## 2016-06-22 DIAGNOSIS — E1100 Type 2 diabetes mellitus with hyperosmolarity without nonketotic hyperglycemic-hyperosmolar coma (NKHHC): Secondary | ICD-10-CM

## 2016-06-22 DIAGNOSIS — R531 Weakness: Secondary | ICD-10-CM

## 2016-06-22 DIAGNOSIS — F203 Undifferentiated schizophrenia: Secondary | ICD-10-CM | POA: Diagnosis not present

## 2016-06-22 NOTE — Progress Notes (Signed)
DATE:  06/22/2016   MRN:  [id number]  BIRTHDAY: June 08, 1946  Facility:  Nursing Home Location:  [facility]  Nursing Home Room Number: 307-P  LEVEL OF CARE:  SNF (31)  Contact Information    Name Relation Home Work [place]   [place],[name] Daughter [phone]     Reed,Gina Daughter [phone]         Code Status History    Date Active Date Inactive Code Status Order ID Comments User Context   06/09/2016  8:31 PM 06/21/2016  5:46 PM Full Code [id number]  Lisbeth RenshawNeelesh Nundkumar, MD Inpatient   04/01/2016  2:36 PM 04/05/2016  7:17 PM Full Code [id number]  Lisbeth RenshawNeelesh Nundkumar, MD Inpatient    Advance Directive Documentation   Flowsheet Row Most Recent Value  Type of Advance Directive  Healthcare Power of Attorney, Out of facility DNR (pink MOST or yellow form)  Pre-existing out of facility DNR order (yellow form or pink MOST form)  No data  "MOST" Form in Place?  No data       Chief Complaint  Patient presents with  . Hospitalization Follow-up    HISTORY OF PRESENT ILLNESS:  This is a 70 year old female who has [facility] to 06/21/16. She had a previous multilevel anterior cervical decompression/fusion. However, upon follow-up, she was noted to have progressive kyphosis. She had posterior stabilization/fusion.  She has been admitted for a short-term rehabilitation.  She was seen in her room and verbalized that her pain is well-controlled.  PAST MEDICAL HISTORY:  Past Medical History:  Diagnosis Date  . Anxiety   . Cervical spondylosis with myelopathy   . Complication of anesthesia    hard time waking up with last procedure years ago  . COPD (chronic obstructive pulmonary disease) (HCC)   . Depression   . Diabetes mellitus without complication (HCC)   . Diabetic neuropathy (HCC)   . Dyspnea   . Headache    migraines  . Hypertension   . Memory deficit   . Pancreatitis   .  Schizophrenia (HCC)   . Stroke [facility](HCC)    no deficits  . Wears dentures   . Wears glasses      CURRENT MEDICATIONS: Reviewed  Patient's Medications  New Prescriptions   No medications on file  Previous Medications   ALBUTEROL (PROVENTIL) (2.5 MG/3ML) 0.083% NEBULIZER SOLUTION    Take 2.5 mg by nebulization every 6 (six) hours as needed for wheezing or shortness of breath.   ASPIRIN EC 81 MG TABLET    Take 81 mg by mouth daily.   MECLIZINE (ANTIVERT) 25 MG TABLET    Take 25 mg by mouth 3 (three) times daily as needed for dizziness.   METFORMIN (GLUCOPHAGE) 500 MG TABLET    Take 500 mg by mouth 2 (two) times daily with a meal.   METHOCARBAMOL (ROBAXIN) 500 MG TABLET    Take 1 tablet (500 mg total) by mouth every 6 (six) hours as needed for muscle spasms.   NUTRITIONAL SUPPLEMENT LIQD    Take 120 mLs by mouth 3 (three) times daily. MedPass   OXYCODONE-ACETAMINOPHEN (PERCOCET/ROXICET) 5-325 MG TABLET    Take 1-2 tablets by mouth every 4 (four) hours as needed for moderate pain.   TRAZODONE (DESYREL) 150 MG TABLET    Take 150 mg by mouth at bedtime.   ZIPRASIDONE (GEODON) 80 MG CAPSULE    Take 80 mg by mouth at bedtime.   Modified  Medications   No medications on file  Discontinued Medications   ENSURE (ENSURE)    Take 237 mLs by mouth 3 (three) times daily between meals.   OXYCODONE-ACETAMINOPHEN (PERCOCET/ROXICET) 5-325 MG TABLET    Take 1-2 tablets by mouth every 4 (four) hours as needed for moderate pain.     Allergies  Allergen Reactions  . No Known Allergies      REVIEW OF SYSTEMS:  GENERAL: no change in appetite, no fatigue, no weight changes, no fever, chills or weakness EYES: Denies change in vision, dry eyes, eye pain, itching or discharge EARS: Denies change in hearing, ringing in ears, or earache NOSE: Denies nasal congestion or epistaxis MOUTH and THROAT: Denies oral discomfort, gingival pain or bleeding, pain from teeth or hoarseness   RESPIRATORY: no cough, SOB, DOE,  wheezing, hemoptysis CARDIAC: no chest pain, edema or palpitations GI: no abdominal pain, diarrhea, constipation, heart burn, nausea or vomiting GU: Denies dysuria, frequency, hematuria, incontinence, or discharge PSYCHIATRIC: Denies feeling of depression or anxiety. No report of hallucinations, insomnia, paranoia, or agitation     PHYSICAL EXAMINATION  GENERAL APPEARANCE: Well nourished. In no acute distress. Normal body habitus SKIN:  Back of neck surgical incision has staples and dry dressing, no redness HEAD: Normal in size and contour. No evidence of trauma EYES: Lids open and close normally. No blepharitis, entropion or ectropion. PERRL. Conjunctivae are clear and sclerae are white. Lenses are without opacity EARS: Pinnae are normal. Patient hears normal voice tunes of the examiner MOUTH and THROAT: Lips are without lesions. Oral mucosa is moist and without lesions. Tongue is normal in shape, size, and color and without lesions NECK: supple, trachea midline, no neck masses, no thyroid tenderness, no thyromegaly, has Aspen neck collar LYMPHATICS: no LAN in the neck, no supraclavicular LAN RESPIRATORY: breathing is even & unlabored, BS CTAB CARDIAC: RRR, no murmur,no extra heart sounds, no edema GI: abdomen soft, normal BS, no masses, no tenderness, no hepatomegaly, no splenomegaly EXTREMITIES:  Able to move X 4 extremities PSYCHIATRIC: Alert and oriented X 3. Affect and behavior are appropriate  LABS/RADIOLOGY: Labs reviewed: Basic Metabolic Panel:  Recent Labs  16/10/96 1003 06/08/16 1442  NA 139 138  K 4.0 4.5  CL 108 106  CO2 20* 23  GLUCOSE 137* 195*  BUN 10 23*  CREATININE 1.07* 0.86  CALCIUM 10.5* 10.1   CBC:  Recent Labs  03/25/16 1003 06/08/16 1442  WBC 8.7 7.3  HGB 12.9 11.6*  HCT 39.6 35.5*  MCV 87.0 85.3  PLT 165 168   CBG:  Recent Labs  06/20/16 2114 06/21/16 0636 06/21/16 1152  GLUCAP 125* 95 147*      Dg Cervical Spine 1  View  Result Date: 06/09/2016 CLINICAL DATA:  Posterior cervical fusion C3-C7. Fluoroscopy time 6 seconds. EXAM: CERVICAL SPINE 1 VIEW COMPARISON:  04/01/2016 FINDINGS: Interval posterior fusion with transpedicular screws involving C3 through C7. Detail below C5 is limited. Remote anterior fusion of C3- C6. IMPRESSION: Interval posterior fusion of C3-C7. Detail below C5 is limited due to portable technique. Electronically Signed   By: Norva Pavlov M.D.   On: 06/09/2016 20:31   Dg C-arm 1-60 Min  Result Date: 06/09/2016 CLINICAL DATA:  Posterior cervical fusion C3-C7. Fluoroscopy time 6 seconds. EXAM: CERVICAL SPINE 1 VIEW COMPARISON:  04/01/2016 FINDINGS: Interval posterior fusion with transpedicular screws involving C3 through C7. Detail below C5 is limited. Remote anterior fusion of C3- C6. IMPRESSION: Interval posterior fusion of C3-C7. Detail below C5  is limited due to portable technique. Electronically Signed   By: Norva PavlovElizabeth  Brown M.D.   On: 06/09/2016 20:31    ASSESSMENT/PLAN:  Generalized weakness - for rehabilitation, PT and OT, for therapeutic strengthening exercises; fall precaution  Cervical region kyphosis - S/P C3-C7 lateral mass fusion; follow-up with neurosurgeon in 10 days; continue Percocet 5/325 mg 1-2 tabs by mouth every 4 hours when necessary for pain; Robaxin 500 mg 1 tab by mouth every 6 hours when necessary for muscle spasm  Insomnia - continue trazodone 150 mg 1 tab by mouth daily at bedtime  Schizophrenia - continue Geodon 80 mg 1 capsule by mouth daily at bedtime; check CMP  Vertigo - continue meclizine 25 mg 1 tab by mouth 3 times a day when necessary  Diabetes mellitus, type II - continue metformin 500 mg 1 tab by mouth twice a day Lab Results  Component Value Date   HGBA1C 7.0 (H) 06/08/2016   Anemia, acute blood loss - re-check CBC Lab Results  Component Value Date   HGB 11.6 (L) 06/08/2016       Goals of care:  Short-term rehabilitation      Kenard GowerMonina Medina-Vargas - NP [facility] [phone]

## 2016-06-24 LAB — CBC AND DIFFERENTIAL
HEMATOCRIT: 31 % — AB (ref 36–46)
HEMOGLOBIN: 10.4 g/dL — AB (ref 12.0–16.0)
Platelets: 268 10*3/uL (ref 150–399)
WBC: 9.9 10*3/mL

## 2016-06-24 LAB — BASIC METABOLIC PANEL
BUN: 10 mg/dL (ref 4–21)
Creatinine: 0.8 mg/dL (ref 0.5–1.1)
Glucose: 142 mg/dL
POTASSIUM: 4 mmol/L (ref 3.4–5.3)
SODIUM: 143 mmol/L (ref 137–147)

## 2016-06-24 LAB — HEPATIC FUNCTION PANEL
ALT: 9 U/L (ref 7–35)
AST: 20 U/L (ref 13–35)
Alkaline Phosphatase: 72 U/L (ref 25–125)
BILIRUBIN, TOTAL: 0.4 mg/dL

## 2016-06-28 ENCOUNTER — Non-Acute Institutional Stay (SKILLED_NURSING_FACILITY): Payer: Medicare Other | Admitting: Internal Medicine

## 2016-06-28 ENCOUNTER — Encounter: Payer: Self-pay | Admitting: Internal Medicine

## 2016-06-28 DIAGNOSIS — I10 Essential (primary) hypertension: Secondary | ICD-10-CM | POA: Diagnosis not present

## 2016-06-28 DIAGNOSIS — R2681 Unsteadiness on feet: Secondary | ICD-10-CM

## 2016-06-28 DIAGNOSIS — R1319 Other dysphagia: Secondary | ICD-10-CM

## 2016-06-28 DIAGNOSIS — F203 Undifferentiated schizophrenia: Secondary | ICD-10-CM

## 2016-06-28 DIAGNOSIS — M4302 Spondylolysis, cervical region: Secondary | ICD-10-CM

## 2016-06-28 DIAGNOSIS — E1122 Type 2 diabetes mellitus with diabetic chronic kidney disease: Secondary | ICD-10-CM

## 2016-06-28 DIAGNOSIS — H8113 Benign paroxysmal vertigo, bilateral: Secondary | ICD-10-CM

## 2016-06-28 DIAGNOSIS — D638 Anemia in other chronic diseases classified elsewhere: Secondary | ICD-10-CM | POA: Diagnosis not present

## 2016-06-28 DIAGNOSIS — G4709 Other insomnia: Secondary | ICD-10-CM

## 2016-06-28 DIAGNOSIS — F0150 Vascular dementia without behavioral disturbance: Secondary | ICD-10-CM | POA: Diagnosis not present

## 2016-06-28 DIAGNOSIS — E1100 Type 2 diabetes mellitus with hyperosmolarity without nonketotic hyperglycemic-hyperosmolar coma (NKHHC): Secondary | ICD-10-CM

## 2016-06-28 DIAGNOSIS — N183 Chronic kidney disease, stage 3 (moderate): Secondary | ICD-10-CM

## 2016-06-28 DIAGNOSIS — E440 Moderate protein-calorie malnutrition: Secondary | ICD-10-CM | POA: Diagnosis not present

## 2016-06-28 NOTE — Progress Notes (Signed)
LOCATION: [facility]  PCP: Darrow BussingKOIRALA,DIBAS, MD   Code Status: DNR  Goals of care: Advanced Directive information Advanced Directives 06/28/2016  Does Patient Have a Medical Advance Directive? Yes  Type of Advance Directive Out of facility DNR (pink MOST or yellow form)  Does patient want to make changes to medical advance directive? -  Copy of Healthcare Power of Attorney in Chart? -  Would patient like information on creating a medical advance directive? -       Extended Emergency Contact Information Primary Emergency Contact: Minnifee,[name] Address: [address]          [place], [place] [id number] [place] Home Phone: [phone] Relation: Daughter Secondary Emergency Contact: Reed,Gina Address: [address]          [place], [place] [address] [place] Home Phone: [phone] Relation: Daughter   Allergies  Allergen Reactions  . No Known Allergies     Chief Complaint  Patient presents with  . New Admit To SNF    New Admission Visit     HPI:  Patient is a 70 y.o. female seen today for short term rehabilitation post hospital admission from 06/09/16-06/21/16 with cervical spondylosis with myelopathy s/p C3-C7 lateral fusion. She is seen in her room today.   Review of Systems:  Constitutional: Negative for fever, chills, diaphoresis.  HENT: Negative for headache, congestion, nasal discharge, sore throat. Positive for difficulty swallowing.   Eyes: Negative for blurred vision, double vision and discharge.  Respiratory: Negative for cough, shortness of breath and wheezing.   Cardiovascular: Negative for chest pain, palpitations, leg swelling.  Gastrointestinal: Negative for heartburn, nausea, vomiting, abdominal pain. She had bowel movement yesterday. Genitourinary: Negative for dysuria.  Musculoskeletal: Negative for neck pain, back pain, fall.  Skin: Negative for itching, rash.  Neurological: Negative for  dizziness. Psychiatric/Behavioral: Negative for depression.   Past Medical History:  Diagnosis Date  . Anxiety   . Cervical spondylosis with myelopathy   . Complication of anesthesia    hard time waking up with last procedure years ago  . COPD (chronic obstructive pulmonary disease) (HCC)   . Depression   . Diabetes mellitus without complication (HCC)   . Diabetic neuropathy (HCC)   . Dyspnea   . Headache    migraines  . Hypertension   . Memory deficit   . Pancreatitis   . Schizophrenia (HCC)   . Stroke [facility](HCC)    no deficits  . Wears dentures   . Wears glasses    Past Surgical History:  Procedure Laterality Date  . ANTERIOR CERVICAL DECOMPRESSION/DISCECTOMY FUSION 4 LEVELS N/A 04/01/2016   Procedure: ANTERIOR CERVICAL DECOMPRESSION/DISCECTOMY FUSION CERVICAL THREE- CERVICAL FOUR, CERVICAL FOUR-CERVICAL FIVE, CORPECTOMY C6;  Surgeon: Lisbeth RenshawNeelesh Nundkumar, MD;  Location: [facility];  Service: Neurosurgery;  Laterality: N/A;  ANTERIOR CERVICAL DECOMPRESSION/DISCECTOMY FUSION C3-C4, C4-C5,C5-C6,C6-C7  . FOOT SURGERY Right   . MULTIPLE TOOTH EXTRACTIONS    . POSTERIOR CERVICAL FUSION/FORAMINOTOMY N/A 06/09/2016   Procedure: Cervical three-four, four-five, five-six, six-seven posterior cervical fusion;  Surgeon: Lisbeth RenshawNeelesh Nundkumar, MD;  Location: [facility] OR;  Service: Neurosurgery;  Laterality: N/A;  . RHINOPLASTY    . ROTATOR CUFF REPAIR    . TONSILLECTOMY    . TUBAL LIGATION     Social History:   reports that she has quit smoking. Her smoking use included Cigarettes. She has never used smokeless tobacco. She reports that she does not drink alcohol or use drugs.  No family history on file.  Medications:   Medication List       Accurate as of 06/28/16  3:08 PM. Always use your most recent med list.          albuterol (2.5 MG/3ML) 0.083% nebulizer solution Commonly known as:  PROVENTIL Take 2.5 mg by nebulization every 6 (six) hours as needed for wheezing or shortness of  breath.   aspirin EC 81 MG tablet Take 81 mg by mouth daily.   meclizine 25 MG tablet Commonly known as:  ANTIVERT Take 25 mg by mouth 3 (three) times daily as needed for dizziness.   metFORMIN 500 MG tablet Commonly known as:  GLUCOPHAGE Take 500 mg by mouth 2 (two) times daily with a meal.   methocarbamol 500 MG tablet Commonly known as:  ROBAXIN Take 1 tablet (500 mg total) by mouth every 6 (six) hours as needed for muscle spasms.   NUTRITIONAL SUPPLEMENT Liqd Take 120 mLs by mouth 3 (three) times daily. MedPass   oxyCODONE-acetaminophen 5-325 MG tablet Commonly known as:  PERCOCET/ROXICET Take 1-2 tablets by mouth every 4 (four) hours as needed for moderate pain.   traZODone 150 MG tablet Commonly known as:  DESYREL Take 150 mg by mouth at bedtime.   ziprasidone 80 MG capsule Commonly known as:  GEODON Take 80 mg by mouth at bedtime.       Immunizations: Immunization History  Administered Date(s) Administered  . PPD Test 06/21/2016     Physical Exam: Vitals:   06/28/16 1339  BP: 133/61  Pulse: 61  Resp: 16  Temp: 97.9 F (36.6 C)  TempSrc: Oral  SpO2: 97%  Weight: 129 lb 12.8 oz (58.9 kg)  Height: 5\' 4"  (1.626 m)   Body mass index is 22.28 kg/m.  General- elderly female, thin built, in no acute distress Head- normocephalic, atraumatic Nose- no maxillary or frontal sinus tenderness, no nasal discharge Throat- moist mucus membrane Eyes- PERRLA, EOMI, no pallor, no icterus, no discharge Neck- no cervical lymphadenopathy Cardiovascular- normal s1,s2, no murmur Respiratory- bilateral clear to auscultation, no wheeze, no rhonchi, no crackles, no use of accessory muscles Abdomen- bowel sounds present, soft, non tender Musculoskeletal- able to move all 4 extremities, limited cervical range of motion with cervical collar in place Neurological- alert and oriented to self, place and year but not to month and president  Skin- warm and dry, surgical  incision to the back of her neck with staples in place Psychiatry- normal mood and affect    Labs reviewed: Basic Metabolic Panel:  Recent Labs  14/48/1808/25/17 1003 06/08/16 1442 06/24/16  NA 139 138 143  K 4.0 4.5 4.0  CL 108 106  --   CO2 20* 23  --   GLUCOSE 137* 195*  --   BUN 10 23* 10  CREATININE 1.07* 0.86 0.8  CALCIUM 10.5* 10.1  --    Liver Function Tests:  Recent Labs  06/24/16  AST 20  ALT 9  ALKPHOS 72   No results for input(s): LIPASE, AMYLASE in the last 8760 hours. No results for input(s): AMMONIA in the last 8760 hours. CBC:  Recent Labs  03/25/16 1003 06/08/16 1442 06/24/16  WBC 8.7 7.3 9.9  HGB 12.9 11.6* 10.4*  HCT 39.6 35.5* 31*  MCV 87.0 85.3  --   PLT 165 168 268   Cardiac Enzymes: No results for input(s): CKTOTAL, CKMB, CKMBINDEX, TROPONINI in the last 8760 hours. BNP: Invalid input(s): POCBNP CBG:  Recent Labs  06/20/16 2114 06/21/16 0636 06/21/16 1152  GLUCAP  125* 95 147*    Radiological Exams: Dg Cervical Spine 1 View  Result Date: 06/09/2016 CLINICAL DATA:  Posterior cervical fusion C3-C7. Fluoroscopy time 6 seconds. EXAM: CERVICAL SPINE 1 VIEW COMPARISON:  04/01/2016 FINDINGS: Interval posterior fusion with transpedicular screws involving C3 through C7. Detail below C5 is limited. Remote anterior fusion of C3- C6. IMPRESSION: Interval posterior fusion of C3-C7. Detail below C5 is limited due to portable technique. Electronically Signed   By: Norva Pavlov M.D.   On: 06/09/2016 20:31   Dg C-arm 1-60 Min  Result Date: 06/09/2016 CLINICAL DATA:  Posterior cervical fusion C3-C7. Fluoroscopy time 6 seconds. EXAM: CERVICAL SPINE 1 VIEW COMPARISON:  04/01/2016 FINDINGS: Interval posterior fusion with transpedicular screws involving C3 through C7. Detail below C5 is limited. Remote anterior fusion of C3- C6. IMPRESSION: Interval posterior fusion of C3-C7. Detail below C5 is limited due to portable technique. Electronically Signed   By:  Norva Pavlov M.D.   On: 06/09/2016 20:31    Assessment/Plan  Unsteady gait Will have patient work with PT/OT as tolerated to regain strength and restore function.  Fall precautions are in place.  Cervical spondylosis with myelopathy S/p cervical fusion. Has follow up with neurosurgery. Continue to wear her neck collar. Continue percocet 5-325 mg 1-2 tab q4h prn pain with robaxin 500 mg q6h prn muscle spasm. Will have her work with physical therapy and occupational therapy team to help with gait training and muscle strengthening exercises.fall precautions. Skin care. Encourage to be out of bed.   DM type 2 with renal impairment Lab Results  Component Value Date   HGBA1C 7.0 (H) 06/08/2016   Monitor cbg. Continue metformin 500 mg bid  Dysphagia Post cva. Get SLP to evaluate, aspiration precautions  Vascular dementia With cognitive impairment. Continue aspirin. SLP to follow for safety awareness  Protein calorie malnutrition Continue nutritional supplement and monitor her weight and po intake  BPV Occasionally with position change, continue meclizine on need basis  Hypertension Monitor bp, off all medication. Continue aspirin ec 81 mg daily.  Insomnia Continue trazodone  Paranoid schizophrenia Continue ziprasidone 80 mg qhs. Monitor mood.  ckd stage 3 Monitor bmp  Anemia of chronic disease Monitor cbc   Goals of care: short term rehabilitation   Labs/tests ordered: cbc, bmp   Family/ staff Communication: reviewed care plan with patient and nursing supervisor    Oneal Grout, MD Internal Medicine [facility] [address] [place], [place] [address] Cell Phone (Monday-Friday 8 am - 5 pm): [phone] On Call: [phone] and follow prompts after 5 pm and on weekends Office Phone: [phone] Office Fax: [phone]

## 2016-07-01 DIAGNOSIS — I10 Essential (primary) hypertension: Secondary | ICD-10-CM | POA: Diagnosis not present

## 2016-07-01 DIAGNOSIS — D638 Anemia in other chronic diseases classified elsewhere: Secondary | ICD-10-CM | POA: Diagnosis not present

## 2016-07-01 DIAGNOSIS — K5909 Other constipation: Secondary | ICD-10-CM | POA: Diagnosis not present

## 2016-07-01 DIAGNOSIS — R531 Weakness: Secondary | ICD-10-CM | POA: Diagnosis not present

## 2016-07-01 DIAGNOSIS — M4322 Fusion of spine, cervical region: Secondary | ICD-10-CM | POA: Diagnosis not present

## 2016-07-01 DIAGNOSIS — M4302 Spondylolysis, cervical region: Secondary | ICD-10-CM | POA: Diagnosis not present

## 2016-07-01 DIAGNOSIS — H8113 Benign paroxysmal vertigo, bilateral: Secondary | ICD-10-CM | POA: Diagnosis not present

## 2016-07-01 DIAGNOSIS — F200 Paranoid schizophrenia: Secondary | ICD-10-CM | POA: Diagnosis not present

## 2016-07-01 DIAGNOSIS — I69391 Dysphagia following cerebral infarction: Secondary | ICD-10-CM | POA: Diagnosis not present

## 2016-07-01 DIAGNOSIS — R1312 Dysphagia, oropharyngeal phase: Secondary | ICD-10-CM | POA: Diagnosis not present

## 2016-07-01 DIAGNOSIS — D62 Acute posthemorrhagic anemia: Secondary | ICD-10-CM | POA: Diagnosis not present

## 2016-07-01 DIAGNOSIS — N183 Chronic kidney disease, stage 3 (moderate): Secondary | ICD-10-CM | POA: Diagnosis not present

## 2016-07-01 DIAGNOSIS — E1122 Type 2 diabetes mellitus with diabetic chronic kidney disease: Secondary | ICD-10-CM | POA: Diagnosis not present

## 2016-07-01 DIAGNOSIS — R2689 Other abnormalities of gait and mobility: Secondary | ICD-10-CM | POA: Diagnosis not present

## 2016-07-01 DIAGNOSIS — M6281 Muscle weakness (generalized): Secondary | ICD-10-CM | POA: Diagnosis not present

## 2016-07-01 DIAGNOSIS — R42 Dizziness and giddiness: Secondary | ICD-10-CM | POA: Diagnosis not present

## 2016-07-01 DIAGNOSIS — G4700 Insomnia, unspecified: Secondary | ICD-10-CM | POA: Diagnosis not present

## 2016-07-01 DIAGNOSIS — F203 Undifferentiated schizophrenia: Secondary | ICD-10-CM | POA: Diagnosis not present

## 2016-07-01 DIAGNOSIS — M4712 Other spondylosis with myelopathy, cervical region: Secondary | ICD-10-CM | POA: Diagnosis not present

## 2016-07-01 DIAGNOSIS — M4012 Other secondary kyphosis, cervical region: Secondary | ICD-10-CM | POA: Diagnosis not present

## 2016-07-01 DIAGNOSIS — Z79899 Other long term (current) drug therapy: Secondary | ICD-10-CM | POA: Diagnosis not present

## 2016-07-01 DIAGNOSIS — Z4889 Encounter for other specified surgical aftercare: Secondary | ICD-10-CM | POA: Diagnosis not present

## 2016-07-11 DIAGNOSIS — I10 Essential (primary) hypertension: Secondary | ICD-10-CM | POA: Diagnosis not present

## 2016-07-11 DIAGNOSIS — M4012 Other secondary kyphosis, cervical region: Secondary | ICD-10-CM | POA: Diagnosis not present

## 2016-07-14 LAB — HEMOGLOBIN A1C: Hemoglobin A1C: 6.4

## 2016-07-21 ENCOUNTER — Encounter: Payer: Self-pay | Admitting: Adult Health

## 2016-07-21 ENCOUNTER — Non-Acute Institutional Stay (SKILLED_NURSING_FACILITY): Payer: Medicare Other | Admitting: Adult Health

## 2016-07-21 DIAGNOSIS — D62 Acute posthemorrhagic anemia: Secondary | ICD-10-CM | POA: Diagnosis not present

## 2016-07-21 DIAGNOSIS — F200 Paranoid schizophrenia: Secondary | ICD-10-CM | POA: Diagnosis not present

## 2016-07-21 DIAGNOSIS — N183 Chronic kidney disease, stage 3 (moderate): Secondary | ICD-10-CM

## 2016-07-21 DIAGNOSIS — R42 Dizziness and giddiness: Secondary | ICD-10-CM

## 2016-07-21 DIAGNOSIS — E1122 Type 2 diabetes mellitus with diabetic chronic kidney disease: Secondary | ICD-10-CM | POA: Diagnosis not present

## 2016-07-21 DIAGNOSIS — G4700 Insomnia, unspecified: Secondary | ICD-10-CM | POA: Diagnosis not present

## 2016-07-21 DIAGNOSIS — M4302 Spondylolysis, cervical region: Secondary | ICD-10-CM

## 2016-07-21 DIAGNOSIS — R531 Weakness: Secondary | ICD-10-CM | POA: Diagnosis not present

## 2016-07-21 NOTE — Progress Notes (Signed)
DATE:  07/21/2016   MRN:  [id number]  BIRTHDAY: 28-Jul-1946  Facility:  Nursing Home Location:  [facility]  Nursing Home Room Number: 307-P  LEVEL OF CARE:  SNF (31)  Contact Information    Name Relation Home Work [place]   Minnifee,[name] Daughter [phone]     Reed,Gina Daughter [phone]         Code Status History    Date Active Date Inactive Code Status Order ID Comments User Context   06/09/2016  8:31 PM 06/21/2016  5:46 PM Full Code [id number]  Lisbeth RenshawNeelesh Nundkumar, MD Inpatient   04/01/2016  2:36 PM 04/05/2016  7:17 PM Full Code [id number]  Lisbeth RenshawNeelesh Nundkumar, MD Inpatient    Advance Directive Documentation   Flowsheet Row Most Recent Value  Type of Advance Directive  Out of facility DNR (pink MOST or yellow form), Healthcare Power of Attorney  Pre-existing out of facility DNR order (yellow form or pink MOST form)  No data  "MOST" Form in Place?  No data       Chief Complaint  Patient presents with  . Medical Management of Chronic Issues    HISTORY OF PRESENT ILLNESS:  This is a 70 year old female who is being seen for a routine visit. She is currently having PT and OT. She has been discharged from ST.  [facility]09/16 to 06/21/16. She had a previous multilevel anterior cervical decompression/fusion. However, upon follow-up, she was noted to have progressive kyphosis. She had posterior stabilization/fusion.   PAST MEDICAL HISTORY:  Past Medical History:  Diagnosis Date  . Anxiety   . Cervical spondylosis with myelopathy   . Complication of anesthesia    hard time waking up with last procedure years ago  . COPD (chronic obstructive pulmonary disease) (HCC)   . Depression   . Diabetes mellitus without complication (HCC)   . Diabetic neuropathy (HCC)   . Dyspnea   . Headache    migraines  . Hypertension   . Memory deficit   . Pancreatitis   . Schizophrenia (HCC)     . Stroke [facility](HCC)    no deficits  . Wears dentures   . Wears glasses      CURRENT MEDICATIONS: Reviewed  Patient's Medications  New Prescriptions   No medications on file  Previous Medications   ALBUTEROL (PROVENTIL) (2.5 MG/3ML) 0.083% NEBULIZER SOLUTION    Take 2.5 mg by nebulization every 6 (six) hours as needed for wheezing or shortness of breath.   ASPIRIN EC 81 MG TABLET    Take 81 mg by mouth daily.   MECLIZINE (ANTIVERT) 25 MG TABLET    Take 25 mg by mouth 3 (three) times daily as needed for dizziness.   METFORMIN (GLUCOPHAGE) 500 MG TABLET    Take 500 mg by mouth 2 (two) times daily with a meal.   METHOCARBAMOL (ROBAXIN) 500 MG TABLET    Take 1 tablet (500 mg total) by mouth every 6 (six) hours as needed for muscle spasms.   NUTRITIONAL SUPPLEMENT LIQD    Take 120 mLs by mouth 3 (three) times daily. MedPass   OXYCODONE-ACETAMINOPHEN (PERCOCET/ROXICET) 5-325 MG TABLET    Take 1-2 tablets by mouth every 4 (four) hours as needed for moderate pain.   TRAZODONE (DESYREL) 150 MG TABLET    Take 150 mg by mouth at bedtime.   ZIPRASIDONE (GEODON) 80 MG CAPSULE    Take 80 mg by mouth at  bedtime.   Modified Medications   No medications on file  Discontinued Medications   No medications on file     Allergies  Allergen Reactions  . No Known Allergies      REVIEW OF SYSTEMS:  GENERAL: no change in appetite, no fatigue, no weight changes, no fever, chills or weakness EYES: Denies change in vision, dry eyes, eye pain, itching or discharge EARS: Denies change in hearing, ringing in ears, or earache NOSE: Denies nasal congestion or epistaxis MOUTH and THROAT: Denies oral discomfort, gingival pain or bleeding, pain from teeth or hoarseness   RESPIRATORY: no cough, SOB, DOE, wheezing, hemoptysis CARDIAC: no chest pain, edema or palpitations GI: no abdominal pain, diarrhea, constipation, heart burn, nausea or vomiting GU: Denies dysuria, frequency, hematuria, incontinence, or  discharge PSYCHIATRIC: Denies feeling of depression or anxiety. No report of hallucinations, insomnia, paranoia, or agitation    PHYSICAL EXAMINATION  GENERAL APPEARANCE: Well nourished. In no acute distress. Normal body habitus SKIN:  Surgical incision at back of neck is healed HEAD: Normal in size and contour. No evidence of trauma EYES: Lids open and close normally. No blepharitis, entropion or ectropion. PERRL. Conjunctivae are clear and sclerae are white. Lenses are without opacity EARS: Pinnae are normal. Patient hears normal voice tunes of the examiner MOUTH and THROAT: Lips are without lesions. Oral mucosa is moist and without lesions. Tongue is normal in shape, size, and color and without lesions NECK: supple, trachea midline, no neck masses, no thyroid tenderness, no thyromegaly; has hard neck collar LYMPHATICS: no LAN in the neck, no supraclavicular LAN RESPIRATORY: breathing is even & unlabored, BS CTAB CARDIAC: RRR, no murmur,no extra heart sounds, no edema GI: abdomen soft, normal BS, no masses, no tenderness, no hepatomegaly, no splenomegaly EXTREMITIES:  Able to move X 4 extremities PSYCHIATRIC: Alert and oriented X 3. Affect and behavior are appropriate  LABS/RADIOLOGY: Labs reviewed: Basic Metabolic Panel:  Recent Labs  16/05/9607/25/17 1003 06/08/16 1442 06/24/16  NA 139 138 143  K 4.0 4.5 4.0  CL 108 106  --   CO2 20* 23  --   GLUCOSE 137* 195*  --   BUN 10 23* 10  CREATININE 1.07* 0.86 0.8  CALCIUM 10.5* 10.1  --    Liver Function Tests:  Recent Labs  06/24/16  AST 20  ALT 9  ALKPHOS 72    CBC:  Recent Labs  03/25/16 1003 06/08/16 1442 06/24/16  WBC 8.7 7.3 9.9  HGB 12.9 11.6* 10.4*  HCT 39.6 35.5* 31*  MCV 87.0 85.3  --   PLT 165 168 268    CBG:  Recent Labs  06/20/16 2114 06/21/16 0636 06/21/16 1152  GLUCAP 125* 95 147*     ASSESSMENT/PLAN:  Generalized weakness - continue rehabilitation, PT and OT, for therapeutic strengthening  exercises; fall precaution  Cervical region kyphosis - S/P C3-C7 lateral mass fusion; follow-up with neurosurgeon ; continue Percocet 5/325 mg 1-2 tabs by mouth every 4 hours when necessary for pain; Robaxin 500 mg 1 tab by mouth every 6 hours when necessary for muscle spasm  Insomnia - continue trazodone 150 mg 1 tab by mouth daily at bedtime  Schizophrenia - continue Geodon 80 mg 1 capsule by mouth daily at bedtime; check CMP  Dizziness - continue meclizine 25 mg 1 tab by mouth 3 times a day when necessary  Diabetes mellitus, type II - continue metformin 500 mg 1 tab by mouth twice a day Lab Results  Component Value Date  HGBA1C 6.4 07/14/2016   Anemia, acute blood loss -  stable Lab Results  Component Value Date   HGB 10.4 (A) 06/24/2016      Goals of care:  Short-term rehabilitation    Tilton Marsalis C. Medina-Vargas - NP [employer] [phone]

## 2016-07-27 ENCOUNTER — Encounter: Payer: Self-pay | Admitting: Internal Medicine

## 2016-07-27 ENCOUNTER — Non-Acute Institutional Stay (SKILLED_NURSING_FACILITY): Payer: Medicare Other | Admitting: Internal Medicine

## 2016-07-27 DIAGNOSIS — N183 Chronic kidney disease, stage 3 unspecified: Secondary | ICD-10-CM

## 2016-07-27 DIAGNOSIS — I69391 Dysphagia following cerebral infarction: Secondary | ICD-10-CM | POA: Diagnosis not present

## 2016-07-27 DIAGNOSIS — K5909 Other constipation: Secondary | ICD-10-CM

## 2016-07-27 DIAGNOSIS — I10 Essential (primary) hypertension: Secondary | ICD-10-CM

## 2016-07-27 DIAGNOSIS — H8113 Benign paroxysmal vertigo, bilateral: Secondary | ICD-10-CM

## 2016-07-27 DIAGNOSIS — D638 Anemia in other chronic diseases classified elsewhere: Secondary | ICD-10-CM | POA: Diagnosis not present

## 2016-07-27 DIAGNOSIS — E1122 Type 2 diabetes mellitus with diabetic chronic kidney disease: Secondary | ICD-10-CM | POA: Diagnosis not present

## 2016-07-27 DIAGNOSIS — F203 Undifferentiated schizophrenia: Secondary | ICD-10-CM | POA: Diagnosis not present

## 2016-07-27 DIAGNOSIS — M4712 Other spondylosis with myelopathy, cervical region: Secondary | ICD-10-CM

## 2016-07-27 NOTE — Progress Notes (Signed)
LOCATION: [facility]  PCP: Darrow BussingKOIRALA,DIBAS, MD   Code Status: DNR  Goals of care: Advanced Directive information Advanced Directives 07/21/2016  Does Patient Have a Medical Advance Directive? Yes  Type of Advance Directive Out of facility DNR (pink MOST or yellow form);Healthcare Power of Attorney  Does patient want to make changes to medical advance directive? No - Patient declined  Copy of Healthcare Power of Attorney in Chart? Yes  Would patient like information on creating a medical advance directive? -       Extended Emergency Contact Information Primary Emergency Contact: Minnifee,[name] Address: [address]          [place], [place] [id number] [place] Home Phone: [phone] Relation: Daughter Secondary Emergency Contact: Reed,Gina Address: [address]          [place], [place] [id number] [place] Home Phone: [phone] Relation: Daughter   Allergies  Allergen Reactions  . No Known Allergies     Chief Complaint  Patient presents with  . Discharge Note    Discharge Visit      HPI:  Patient is a 70 y.o. female seen today for Discharge visit. She has been here for short term rehabilitation post hospital admission from 06/09/16-06/21/16 with cervical spondylosis with myelopathy s/p C3-C7 lateral fusion. She is seen in her room today. She denies any concern. Her pain has been under control and as per nursing she has only occasionally been asking for her pain medication. She has worked with physical therapy and occupational therapy team and made some progress with her strength and balance. She continues to have the cervical collar in place.   Review of Systems:  Constitutional: Negative for fever, chills.  HENT: Negative for headache, congestion, nasal discharge. Eyes: Negative for double vision and discharge.  Respiratory: Negative for cough, shortness of breath and wheezing.   Cardiovascular: Negative for chest pain,  palpitations, leg swelling.  Gastrointestinal: Negative for heartburn, nausea, vomiting, abdominal pain. She has chronic constipation. Genitourinary: Negative for dysuria.  Musculoskeletal: Negative for back pain, fall.  Skin: Negative for itching, rash.  Neurological: Negative for dizziness. Psychiatric/Behavioral: Negative for depression.   Past Medical History:  Diagnosis Date  . Anxiety   . Cervical spondylosis with myelopathy   . Complication of anesthesia    hard time waking up with last procedure years ago  . COPD (chronic obstructive pulmonary disease) (HCC)   . Depression   . Diabetes mellitus without complication (HCC)   . Diabetic neuropathy (HCC)   . Dyspnea   . Headache    migraines  . Hypertension   . Memory deficit   . Pancreatitis   . Schizophrenia (HCC)   . [facility])    no deficits  . Wears dentures   . Wears glasses    Past Surgical History:  Procedure Laterality Date  . ANTERIOR CERVICAL DECOMPRESSION/DISCECTOMY FUSION 4 LEVELS N/A 04/01/2016   Procedure: ANTERIOR CERVICAL DECOMPRESSION/DISCECTOMY FUSION CERVICAL THREE- CERVICAL FOUR, CERVICAL FOUR-CERVICAL FIVE, CORPECTOMY C6;  Surgeon: Lisbeth RenshawNeelesh Nundkumar, MD;  Location: [facility];  Service: Neurosurgery;  Laterality: N/A;  ANTERIOR CERVICAL DECOMPRESSION/DISCECTOMY FUSION C3-C4, C4-C5,C5-C6,C6-C7  . FOOT SURGERY Right   . MULTIPLE TOOTH EXTRACTIONS    . POSTERIOR CERVICAL FUSION/FORAMINOTOMY N/A 06/09/2016   Procedure: Cervical three-four, four-five, five-six, six-seven posterior cervical fusion;  Surgeon: Lisbeth RenshawNeelesh Nundkumar, MD;  Location: [facility] OR;  Service: Neurosurgery;  Laterality: N/A;  . RHINOPLASTY    . ROTATOR CUFF REPAIR    . TONSILLECTOMY    .  TUBAL LIGATION     Social History:   reports that she has quit smoking. Her smoking use included Cigarettes. She has never used smokeless tobacco. She reports that she does not drink alcohol or use drugs.  No family history on  file.  Medications: Allergies as of 07/27/2016      Reactions   No Known Allergies       Medication List       Accurate as of 07/27/16  9:20 AM. Always use your most recent med list.          albuterol (2.5 MG/3ML) 0.083% nebulizer solution Commonly known as:  PROVENTIL Take 2.5 mg by nebulization every 6 (six) hours as needed for wheezing or shortness of breath.   aspirin EC 81 MG tablet Take 81 mg by mouth daily.   meclizine 25 MG tablet Commonly known as:  ANTIVERT Take 25 mg by mouth 3 (three) times daily as needed for dizziness.   metFORMIN 500 MG tablet Commonly known as:  GLUCOPHAGE Take 500 mg by mouth 2 (two) times daily with a meal.   methocarbamol 500 MG tablet Commonly known as:  ROBAXIN Take 1 tablet (500 mg total) by mouth every 6 (six) hours as needed for muscle spasms.   NUTRITIONAL SUPPLEMENT Liqd Take 120 mLs by mouth 3 (three) times daily. MedPass   oxyCODONE-acetaminophen 5-325 MG tablet Commonly known as:  PERCOCET/ROXICET Take 1-2 tablets by mouth every 4 (four) hours as needed for moderate pain.   traZODone 150 MG tablet Commonly known as:  DESYREL Take 150 mg by mouth at bedtime.   ziprasidone 80 MG capsule Commonly known as:  GEODON Take 80 mg by mouth at bedtime.       Immunizations: Immunization History  Administered Date(s) Administered  . PPD Test 06/21/2016     Physical Exam: Vitals:   07/27/16 0916  BP: (!) 142/61  Pulse: 60  Resp: 20  Temp: 97.5 F (36.4 C)  TempSrc: Oral  SpO2: 97%  Weight: 129 lb 12.8 oz (58.9 kg)  Height: 5\' 4"  (1.626 m)   Body mass index is 22.28 kg/m.  General- elderly female, thin built, in no acute distress Head- normocephalic, atraumatic Throat- moist mucus membrane Eyes- PERRLA, EOMI, no pallor, no icterus, no discharge Neck- no cervical lymphadenopathy, cervical collar in place Cardiovascular- normal s1,s2, no murmur Respiratory- bilateral clear to auscultation, no wheeze, no  rhonchi, no crackles, no use of accessory muscles Abdomen- bowel sounds present, soft, non tender Musculoskeletal- able to move all 4 extremities Neurological- alert and oriented x 3 Skin- warm and dry Psychiatry- normal mood and affect    Labs reviewed: Basic Metabolic Panel:  Recent Labs  40/98/1108/25/17 1003 06/08/16 1442 06/24/16  NA 139 138 143  K 4.0 4.5 4.0  CL 108 106  --   CO2 20* 23  --   GLUCOSE 137* 195*  --   BUN 10 23* 10  CREATININE 1.07* 0.86 0.8  CALCIUM 10.5* 10.1  --    Liver Function Tests:  Recent Labs  06/24/16  AST 20  ALT 9  ALKPHOS 72   No results for input(s): LIPASE, AMYLASE in the last 8760 hours. No results for input(s): AMMONIA in the last 8760 hours. CBC:  Recent Labs  03/25/16 1003 06/08/16 1442 06/24/16  WBC 8.7 7.3 9.9  HGB 12.9 11.6* 10.4*  HCT 39.6 35.5* 31*  MCV 87.0 85.3  --   PLT 165 168 268   Cardiac Enzymes: No results for  input(s): CKTOTAL, CKMB, CKMBINDEX, TROPONINI in the last 8760 hours. BNP: Invalid input(s): POCBNP CBG:  Recent Labs  06/20/16 2114 06/21/16 0636 06/21/16 1152  GLUCAP 125* 95 147*   Lab Results  Component Value Date   HGBA1C 6.4 07/14/2016    Radiological Exams: No results found.  Assessment/Plan   Patient worked with physical therapy, facial therapy and speech therapy team at the facility. She has made clinical progress. She is stable to be discharged to assisted living facility for further care.   Patient is being discharged with the following home health services:  PT and OT  Patient is being discharged with the following durable medical equipment:  None. She has a rollator walker.   Patient has been advised to f/u with their PCP in 1-2 weeks to bring them up to date on their rehab stay.  Social services at facility was responsible for arranging this appointment. For controlled substances, script has been provided. Further refills to be obtained from PCP.   Plan of care discussed  with patient and verbalizes understanding this. Reviewed care plan with nursing staff    Cervical spondylosis with myelopathy S/p cervical fusion. Has follow up with neurosurgery. Continue to wear her neck collar. Continue percocet but decrease frequency from 5-325 mg 1-2 tab q4h prn pain to q8h prn pain and continue with robaxin 500 mg q6h prn muscle spasm. Will have her work with physical therapy and occupational therapy team at the ALF  DM type 2 with renal impairment Lab Results  Component Value Date   HGBA1C 6.4 07/14/2016   Monitor cbg. Continue metformin 500 mg bid  Dysphagia Post CVA. Continue mechanical soft diet and aspiration precautions  Chronic Constipation Start senna s 2 tan qhs and monitor  Paranoid schizophrenia Continue ziprasidone 80 mg qhs. Monitor mood.  ckd stage 3 Monitor bmp  Anemia of chronic disease Monitor cbc  Vascular dementia With cognitive impairment. Continue aspirin. Supportive care.   BPV Occasionally with position change, continue meclizine on need basis  Hypertension Monitor bp, off all medication. Continue aspirin ec 81 mg daily.    Family/ staff Communication: reviewed care plan with patient and nursing supervisor    Oneal Grout, MD Internal Medicine [facility] [address] [place], [place] [address] Cell Phone (Monday-Friday 8 am - 5 pm): [phone] On Call: [phone] and follow prompts after 5 pm and on weekends Office Phone: [phone] Office Fax: [phone]

## 2016-08-08 DIAGNOSIS — M4012 Other secondary kyphosis, cervical region: Secondary | ICD-10-CM | POA: Diagnosis not present

## 2016-08-08 DIAGNOSIS — I10 Essential (primary) hypertension: Secondary | ICD-10-CM | POA: Diagnosis not present

## 2016-08-10 DIAGNOSIS — Z79899 Other long term (current) drug therapy: Secondary | ICD-10-CM | POA: Diagnosis not present

## 2016-08-10 DIAGNOSIS — E1165 Type 2 diabetes mellitus with hyperglycemia: Secondary | ICD-10-CM | POA: Diagnosis not present

## 2016-08-10 DIAGNOSIS — I10 Essential (primary) hypertension: Secondary | ICD-10-CM | POA: Diagnosis not present

## 2016-08-10 DIAGNOSIS — F331 Major depressive disorder, recurrent, moderate: Secondary | ICD-10-CM | POA: Diagnosis not present

## 2016-08-10 DIAGNOSIS — E119 Type 2 diabetes mellitus without complications: Secondary | ICD-10-CM | POA: Diagnosis not present

## 2016-08-10 DIAGNOSIS — E538 Deficiency of other specified B group vitamins: Secondary | ICD-10-CM | POA: Diagnosis not present

## 2016-08-10 DIAGNOSIS — Z7984 Long term (current) use of oral hypoglycemic drugs: Secondary | ICD-10-CM | POA: Diagnosis not present

## 2016-10-06 DIAGNOSIS — M4012 Other secondary kyphosis, cervical region: Secondary | ICD-10-CM | POA: Diagnosis not present

## 2017-01-20 DIAGNOSIS — F331 Major depressive disorder, recurrent, moderate: Secondary | ICD-10-CM | POA: Diagnosis not present

## 2017-01-20 DIAGNOSIS — Z79899 Other long term (current) drug therapy: Secondary | ICD-10-CM | POA: Diagnosis not present

## 2017-01-20 DIAGNOSIS — R269 Unspecified abnormalities of gait and mobility: Secondary | ICD-10-CM | POA: Diagnosis not present

## 2017-01-20 DIAGNOSIS — M5090 Cervical disc disorder, unspecified, unspecified cervical region: Secondary | ICD-10-CM | POA: Diagnosis not present

## 2017-01-20 DIAGNOSIS — I10 Essential (primary) hypertension: Secondary | ICD-10-CM | POA: Diagnosis not present

## 2017-01-20 DIAGNOSIS — M25511 Pain in right shoulder: Secondary | ICD-10-CM | POA: Diagnosis not present

## 2017-01-20 DIAGNOSIS — Z7984 Long term (current) use of oral hypoglycemic drugs: Secondary | ICD-10-CM | POA: Diagnosis not present

## 2017-01-20 DIAGNOSIS — E538 Deficiency of other specified B group vitamins: Secondary | ICD-10-CM | POA: Diagnosis not present

## 2017-01-20 DIAGNOSIS — E119 Type 2 diabetes mellitus without complications: Secondary | ICD-10-CM | POA: Diagnosis not present

## 2017-01-25 ENCOUNTER — Encounter (HOSPITAL_COMMUNITY): Payer: Self-pay

## 2017-01-25 ENCOUNTER — Emergency Department (HOSPITAL_COMMUNITY): Payer: Medicare Other

## 2017-01-25 DIAGNOSIS — M542 Cervicalgia: Secondary | ICD-10-CM | POA: Diagnosis not present

## 2017-01-25 DIAGNOSIS — Z87891 Personal history of nicotine dependence: Secondary | ICD-10-CM | POA: Insufficient documentation

## 2017-01-25 DIAGNOSIS — Z7984 Long term (current) use of oral hypoglycemic drugs: Secondary | ICD-10-CM | POA: Diagnosis not present

## 2017-01-25 DIAGNOSIS — Z7982 Long term (current) use of aspirin: Secondary | ICD-10-CM | POA: Insufficient documentation

## 2017-01-25 DIAGNOSIS — F419 Anxiety disorder, unspecified: Secondary | ICD-10-CM | POA: Insufficient documentation

## 2017-01-25 DIAGNOSIS — R42 Dizziness and giddiness: Secondary | ICD-10-CM | POA: Insufficient documentation

## 2017-01-25 DIAGNOSIS — E1140 Type 2 diabetes mellitus with diabetic neuropathy, unspecified: Secondary | ICD-10-CM | POA: Insufficient documentation

## 2017-01-25 DIAGNOSIS — M79601 Pain in right arm: Secondary | ICD-10-CM | POA: Diagnosis not present

## 2017-01-25 DIAGNOSIS — I10 Essential (primary) hypertension: Secondary | ICD-10-CM | POA: Insufficient documentation

## 2017-01-25 DIAGNOSIS — J449 Chronic obstructive pulmonary disease, unspecified: Secondary | ICD-10-CM | POA: Diagnosis not present

## 2017-01-25 DIAGNOSIS — E119 Type 2 diabetes mellitus without complications: Secondary | ICD-10-CM | POA: Diagnosis not present

## 2017-01-25 DIAGNOSIS — Z8673 Personal history of transient ischemic attack (TIA), and cerebral infarction without residual deficits: Secondary | ICD-10-CM | POA: Diagnosis not present

## 2017-01-25 LAB — BASIC METABOLIC PANEL
Anion gap: 15 (ref 5–15)
BUN: 17 mg/dL (ref 6–20)
CALCIUM: 10.1 mg/dL (ref 8.9–10.3)
CO2: 22 mmol/L (ref 22–32)
CREATININE: 1.19 mg/dL — AB (ref 0.44–1.00)
Chloride: 103 mmol/L (ref 101–111)
GFR calc Af Amer: 52 mL/min — ABNORMAL LOW (ref >60)
GFR, EST NON AFRICAN AMERICAN: 45 mL/min — AB (ref >60)
Glucose, Bld: 198 mg/dL — ABNORMAL HIGH (ref 65–99)
Potassium: 3.5 mmol/L (ref 3.5–5.1)
SODIUM: 140 mmol/L (ref 135–145)

## 2017-01-25 LAB — URINALYSIS, ROUTINE W REFLEX MICROSCOPIC
BILIRUBIN URINE: NEGATIVE
Glucose, UA: 50 mg/dL — AB
HGB URINE DIPSTICK: NEGATIVE
Ketones, ur: NEGATIVE mg/dL
NITRITE: NEGATIVE
PH: 7 (ref 5.0–8.0)
Protein, ur: 100 mg/dL — AB
SPECIFIC GRAVITY, URINE: 1.02 (ref 1.005–1.030)

## 2017-01-25 LAB — CBC
HCT: 38.5 % (ref 36.0–46.0)
Hemoglobin: 12.7 g/dL (ref 12.0–15.0)
MCH: 27.6 pg (ref 26.0–34.0)
MCHC: 33 g/dL (ref 30.0–36.0)
MCV: 83.7 fL (ref 78.0–100.0)
PLATELETS: 221 10*3/uL (ref 150–400)
RBC: 4.6 MIL/uL (ref 3.87–5.11)
RDW: 13.9 % (ref 11.5–15.5)
WBC: 11.7 10*3/uL — AB (ref 4.0–10.5)

## 2017-01-25 NOTE — ED Triage Notes (Signed)
Pt comes for dizziness, has an unwitnessed fall yesterday, unknown LOC and if she hit her head, today dizziness and vomited x 1, on blood thinners. C/o of R arm and R rib pain.

## 2017-01-26 ENCOUNTER — Emergency Department (HOSPITAL_COMMUNITY): Payer: Medicare Other

## 2017-01-26 ENCOUNTER — Emergency Department (HOSPITAL_COMMUNITY)
Admission: EM | Admit: 2017-01-26 | Discharge: 2017-01-26 | Disposition: A | Discharge status: Home / Self Care | Payer: Medicare Other | Attending: Emergency Medicine | Admitting: Emergency Medicine

## 2017-01-26 DIAGNOSIS — R42 Dizziness and giddiness: Secondary | ICD-10-CM

## 2017-01-26 MED ORDER — LORAZEPAM 1 MG PO TABS
1.0000 mg | ORAL_TABLET | Freq: Once | ORAL | Status: AC
  Administered 2017-01-26: 1 mg via ORAL
  Filled 2017-01-26: qty 1

## 2017-01-26 MED ORDER — LORAZEPAM 0.5 MG PO TABS: 1.0000 mg | ORAL_TABLET | Freq: Three times a day (TID) | ORAL | 0 refills | Status: DC | PRN

## 2017-01-26 MED ORDER — MECLIZINE HCL 25 MG PO TABS: 25.0000 mg | ORAL_TABLET | Freq: Three times a day (TID) | ORAL | 0 refills | Status: DC | PRN

## 2017-01-26 MED ORDER — ONDANSETRON 4 MG PO TBDP
8.0000 mg | ORAL_TABLET | Freq: Once | ORAL | Status: AC
  Administered 2017-01-26: 8 mg via ORAL
  Filled 2017-01-26: qty 2

## 2017-01-26 MED ORDER — MECLIZINE HCL 25 MG PO TABS
25.0000 mg | ORAL_TABLET | Freq: Once | ORAL | Status: AC
  Administered 2017-01-26: 25 mg via ORAL
  Filled 2017-01-26: qty 1

## 2017-01-26 NOTE — Discharge Instructions (Signed)
Return if symptoms are getting worse. °

## 2017-01-26 NOTE — ED Notes (Signed)
Pt returned from MRI. No complaints at this time. Family at bedside

## 2017-01-26 NOTE — ED Notes (Signed)
Patient transported to MRI 

## 2017-01-26 NOTE — ED Provider Notes (Addendum)
[facility]-EM[facility]Y DE[facility] Provider Note   CSN: [id number] Arrival date & time: 01/25/17  2225     History   Chief Complaint Chief Complaint  Patient presents with  . Dizziness  . Fall    HPI [name] is a 71 y.o. female.  The history is provided by the patient.  She complains of dizziness for the last 2 days. She had one episode where she fell. There is unknown loss of consciousness. She is not sure if she hit her head, but fall was unwitnessed. She has difficulty describing her dizziness, but it is worse with any kind of movement. There is associated nausea and she has vomited. She denies headache, ear pain, tinnitus, change in chronic hearing loss. She has not taken any medication at home.  Past Medical History:  Diagnosis Date  . Anxiety   . Cervical spondylosis with myelopathy   . Complication of anesthesia    hard time waking up with last procedure years ago  . COPD (chronic obstructive pulmonary disease) (HCC)   . Depression   . Diabetes mellitus without complication (HCC)   . Diabetic neuropathy (HCC)   . Dyspnea   . Headache    migraines  . Hypertension   . Memory deficit   . Pancreatitis   . Schizophrenia (HCC)   . Stroke [facility](HCC)    no deficits  . Wears dentures   . Wears glasses     Patient Active Problem List   Diagnosis Date Noted  . Other secondary kyphosis, cervical region 06/09/2016  . Schizophrenia (HCC) 04/29/2016  . Type 2 diabetes mellitus (HCC) 04/29/2016  . Cervical spondylosis with myelopathy 04/01/2016  . Myelopathy (HCC) 02/19/2016  . Cognitive impairment 02/19/2016  . History of alcohol abuse 02/19/2016    Past Surgical History:  Procedure Laterality Date  . ANTERIOR CERVICAL DECOMPRESSION/DISCECTOMY FUSION 4 LEVELS N/A 04/01/2016   Procedure: ANTERIOR CERVICAL DECOMPRESSION/DISCECTOMY FUSION CERVICAL THREE- CERVICAL FOUR, CERVICAL FOUR-CERVICAL FIVE, CORPECTOMY C6;  Surgeon: Lisbeth RenshawNeelesh Nundkumar, MD;  Location: [facility];  Service:  Neurosurgery;  Laterality: N/A;  ANTERIOR CERVICAL DECOMPRESSION/DISCECTOMY FUSION C3-C4, C4-C5,C5-C6,C6-C7  . FOOT SURGERY Right   . MULTIPLE TOOTH EXTRACTIONS    . POSTERIOR CERVICAL FUSION/FORAMINOTOMY N/A 06/09/2016   Procedure: Cervical three-four, four-five, five-six, six-seven posterior cervical fusion;  Surgeon: Lisbeth RenshawNeelesh Nundkumar, MD;  Location: [facility] OR;  Service: Neurosurgery;  Laterality: N/A;  . RHINOPLASTY    . ROTATOR CUFF REPAIR    . TONSILLECTOMY    . TUBAL LIGATION      OB History    No data available       Home Medications    Prior to Admission medications   Medication Sig Start Date End Date Taking? Authorizing Provider  albuterol (PROVENTIL) (2.5 MG/3ML) 0.083% nebulizer solution Take 2.5 mg by nebulization every 6 (six) hours as needed for wheezing or shortness of breath.    [provider]  aspirin EC 81 MG tablet Take 81 mg by mouth daily.    [provider]  meclizine (ANTIVERT) 25 MG tablet Take 25 mg by mouth 3 (three) times daily as needed for dizziness.    [provider]  metFORMIN (GLUCOPHAGE) 500 MG tablet Take 500 mg by mouth 2 (two) times daily with a meal.    [provider]  methocarbamol (ROBAXIN) 500 MG tablet Take 1 tablet (500 mg total) by mouth every 6 (six) hours as needed for muscle spasms. 04/05/16   Lisbeth RenshawNundkumar, Neelesh, MD  NUTRITIONAL SUPPLEMENT LIQD Take 120  mLs by mouth 3 (three) times daily. MedPass    [provider]  oxyCODONE-acetaminophen (PERCOCET/ROXICET) 5-325 MG tablet Take 1-2 tablets by mouth every 4 (four) hours as needed for moderate pain. 06/21/16   Lisbeth Renshaw, MD  traZODone (DESYREL) 150 MG tablet Take 150 mg by mouth at bedtime.    [provider]  ziprasidone (GEODON) 80 MG capsule Take 80 mg by mouth at bedtime.     [provider]    Family History No family history on file.  Social History Social History  Substance Use Topics  . Smoking status:  Former Smoker    Types: Cigarettes  . Smokeless tobacco: Never Used     Comment: in 2013  . Alcohol use No     Allergies   No known allergies   Review of Systems Review of Systems  All other systems reviewed and are negative.    Physical Exam Updated Vital Signs BP (!) 153/95 (BP Location: Left Arm)   Pulse 77   Temp 99.1 F (37.3 C) (Oral)   Resp 18   SpO2 100%   Physical Exam  Nursing note and vitals reviewed.  71 year old female, resting comfortably and in no acute distress. Vital signs are significant for hypertension. Oxygen saturation is 100%, which is normal. Head is normocephalic and atraumatic. PERRLA, EOMI. Oropharynx is clear. Neck is nontender and supple without adenopathy or JVD. Back is nontender and there is no CVA tenderness. Lungs are clear without rales, wheezes, or rhonchi. Chest is nontender. Heart has regular rate and rhythm without murmur. Abdomen is soft, flat, nontender without masses or hepatosplenomegaly and peristalsis is normoactive. Extremities have no cyanosis or edema, full range of motion is present. Skin is warm and dry without rash. Neurologic: Mental status is normal, cranial nerves are intact, there are no motor or sensory deficits. She has some lip smacking. Her nystagmus is noted on both lateral, and upward/downward gaze. Dizziness is reproduced by putting her through [facility] exam, and by passive head movement.  ED Treatments / Results  Labs (all labs ordered are listed, but only abnormal results are displayed) Labs Reviewed  BASIC METABOLIC PANEL - Abnormal; Notable for the following:       Result Value   Glucose, Bld 198 (*)    Creatinine, Ser 1.19 (*)    GFR calc non Af Amer 45 (*)    GFR calc Af Amer 52 (*)    All other components within normal limits  CBC - Abnormal; Notable for the following:    WBC 11.7 (*)    All other components within normal limits  URINALYSIS, ROUTINE W REFLEX MICROSCOPIC - Abnormal; Notable for the  following:    APPearance HAZY (*)    Glucose, UA 50 (*)    Protein, ur 100 (*)    Leukocytes, UA SMALL (*)    Bacteria, UA RARE (*)    Squamous Epithelial / LPF 0-5 (*)    All other components within normal limits  CBG MONITORING, ED    EKG  EKG Interpretation  Date/Time:  Wednesday January 25 2017 22:42:41 EDT Ventricular Rate:  85 PR Interval:  134 QRS Duration: 86 QT Interval:  382 QTC Calculation: 454 R Axis:   67 Text Interpretation:  Normal sinus rhythm Right atrial enlargement Borderline ECG When compared with ECG of 8/235/2017, Right atrial enlargement is now present Confirmed by Dione Booze ([id number]) on 01/25/2017 11:25:30 PM       Radiology Ct Head Wo  Contrast  Result Date: 01/25/2017 CLINICAL DATA:  71 year old female with dizziness. EXAM: CT HEAD WITHOUT CONTRAST TECHNIQUE: Contiguous axial images were obtained from the base of the skull through the vertex without intravenous contrast. COMPARISON:  03/04/2016 MRI brain and prior studies FINDINGS: Brain: No evidence of acute infarction, hemorrhage, hydrocephalus, extra-axial collection or mass lesion/mass effect. Atrophy and chronic small-vessel white matter ischemic changes again noted. Vascular: Intracranial atherosclerotic calcifications noted. Skull: Normal. Negative for fracture or focal lesion. Sinuses/Orbits:  Unremarkable Other: None. IMPRESSION: No evidence of acute intracranial abnormality. Atrophy and chronic small-vessel white matter ischemic changes. Electronically Signed   By: Harmon Pier M.D.   On: 01/25/2017 23:21   Mr Brain Wo Contrast  Result Date: 01/26/2017 CLINICAL DATA:  Dizziness. Unwitnessed fall yesterday. Any coagulated. EXAM: MRI HEAD WITHOUT CONTRAST TECHNIQUE: Multiplanar, multiecho pulse sequences of the brain and surrounding structures were obtained without intravenous contrast. COMPARISON:  CT 01/25/2017.  MRI 03/04/2016. FINDINGS: Brain: Diffusion imaging does not show any acute or subacute  infarction. Mild chronic small-vessel ischemic changes affect the pons. There are a few old small vessel cerebellar infarctions. Moderate chronic small-vessel ischemic changes affect the cerebral hemispheric white matter and the basal ganglia, similar to the study of last year. No cortical or large vessel territory infarction. No mass lesion, hemorrhage, hydrocephalus or extra-axial collection. Vascular: Major vessels at the base of the brain show flow. Skull and upper cervical spine: Negative Sinuses/Orbits: Clear/normal Other: None significant IMPRESSION: No acute or traumatic finding. Moderate chronic small-vessel ischemic changes, similar to the study of August 2017. Electronically Signed   By: Paulina Fusi M.D.   On: 01/26/2017 08:02    Procedures Procedures (including critical care time)  Medications Ordered in ED Medications  ondansetron (ZOFRAN-ODT) disintegrating tablet 8 mg (8 mg Oral Given 01/26/17 0432)  meclizine (ANTIVERT) tablet 25 mg (25 mg Oral Given 01/26/17 0433)  LORazepam (ATIVAN) tablet 1 mg (1 mg Oral Given 01/26/17 0607)     Initial Impression / Assessment and Plan / ED Course  I have reviewed the triage vital signs and the nursing notes.  Pertinent labs & imaging results that were available during my care of the patient were reviewed by me and considered in my medical decision making (see chart for details).  Vertigo which is most likely peripheral vertigo. Laboratory workup shows mild renal insufficiency. CT of the head shows no acute process. She will be given ondansetron for nausea, and meclizine for dizziness and observed in the ED for response. Old records are reviewed, and she has no relevant past visits.  She had noted significant relief with above noted treatment. She will be sent for MRI scan, and is given a dose of lorazepam.   MRI shows no acute process. She actually feels better at this point. I am not sure it was delayed onset of meclizine, or the dose of  lorazepam. She is discharged with prescriptions for both, follow-up with PCP. Return precautions discussed.  Final Clinical Impressions(s) / ED Diagnoses   Final diagnoses:  Vertigo    New Prescriptions New Prescriptions   LORAZEPAM (ATIVAN) 0.5 MG TABLET    Take 2 tablets (1 mg total) by mouth 3 (three) times daily as needed for anxiety (or dizziness).     Dione Booze, MD 01/26/17 Mertie Moores    Dione Booze, MD 01/26/17 [phone]

## 2017-01-26 NOTE — ED Notes (Signed)
Pt and family left department before discharge papers could be given. Pt did not have IV. WIll give d/c papers to nurse first in case pt comes for the prescription.

## 2017-01-27 ENCOUNTER — Ambulatory Visit: Payer: Medicare Other | Admitting: Physical Therapy

## 2017-03-24 ENCOUNTER — Other Ambulatory Visit: Payer: Self-pay | Admitting: Family Medicine

## 2017-03-24 DIAGNOSIS — M8580 Other specified disorders of bone density and structure, unspecified site: Secondary | ICD-10-CM

## 2017-03-24 DIAGNOSIS — Z1231 Encounter for screening mammogram for malignant neoplasm of breast: Secondary | ICD-10-CM

## 2017-03-30 ENCOUNTER — Encounter: Payer: Self-pay | Admitting: Internal Medicine

## 2017-04-10 ENCOUNTER — Ambulatory Visit
Admission: RE | Admit: 2017-04-10 | Discharge: 2017-04-10 | Disposition: A | Discharge status: Home / Self Care | Payer: Medicare Other | Source: Ambulatory Visit | Attending: Family Medicine | Admitting: Family Medicine

## 2017-04-10 ENCOUNTER — Ambulatory Visit: Payer: Self-pay

## 2017-04-10 DIAGNOSIS — M8580 Other specified disorders of bone density and structure, unspecified site: Secondary | ICD-10-CM

## 2017-05-25 DIAGNOSIS — Z1151 Encounter for screening for human papillomavirus (HPV): Secondary | ICD-10-CM | POA: Diagnosis not present

## 2017-05-25 DIAGNOSIS — Z124 Encounter for screening for malignant neoplasm of cervix: Secondary | ICD-10-CM | POA: Diagnosis not present

## 2017-06-01 ENCOUNTER — Encounter: Payer: Self-pay | Admitting: Internal Medicine

## 2017-09-18 ENCOUNTER — Ambulatory Visit
Admission: RE | Admit: 2017-09-18 | Discharge: 2017-09-18 | Disposition: A | Discharge status: Home / Self Care | Payer: Medicare (Managed Care) | Source: Ambulatory Visit | Attending: Nurse Practitioner | Admitting: Nurse Practitioner

## 2017-09-18 ENCOUNTER — Other Ambulatory Visit: Payer: Self-pay | Admitting: Nurse Practitioner

## 2017-09-18 DIAGNOSIS — R079 Chest pain, unspecified: Secondary | ICD-10-CM

## 2017-09-18 DIAGNOSIS — R52 Pain, unspecified: Secondary | ICD-10-CM

## 2017-09-22 ENCOUNTER — Ambulatory Visit
Admission: RE | Admit: 2017-09-22 | Discharge: 2017-09-22 | Disposition: A | Discharge status: Home / Self Care | Payer: Medicare (Managed Care) | Source: Ambulatory Visit | Attending: Family Medicine | Admitting: Family Medicine

## 2017-09-22 ENCOUNTER — Other Ambulatory Visit: Payer: Self-pay | Admitting: Family Medicine

## 2017-09-22 DIAGNOSIS — M25511 Pain in right shoulder: Secondary | ICD-10-CM

## 2017-09-22 DIAGNOSIS — M79601 Pain in right arm: Secondary | ICD-10-CM

## 2017-10-27 ENCOUNTER — Encounter: Payer: Self-pay | Admitting: Internal Medicine

## 2017-10-27 NOTE — Progress Notes (Signed)
: Provider:  Margit HanksAlexander, Zaidin Blyden D., MD Location:  [facility] Room Number: 217-D Place of Service:  SNF [phone])  PCP: Estevan OaksBrown, Beverly Ann, NP Patient Care Team: Estevan OaksBrown, Beverly Ann, NP as PCP - General (Nurse Practitioner)  Extended Emergency Contact Information Primary Emergency Contact: Minnifee,[name] Address: [address]          [place], [place] [address] [place] Home Phone: [phone] Relation: Daughter Secondary Emergency Contact: Reed,Gina Address: [address]          [place], [place] [address] [place] Home Phone: [phone] Relation: Daughter     Allergies: No known allergies  Chief Complaint  Patient presents with  . New Admit To SNF    New admission [facility] for respite care    HPI: Patient is 72 y.o. female who  Past Medical History:  Diagnosis Date  . Anxiety   . Cervical spondylosis with myelopathy   . Complication of anesthesia    hard time waking up with last procedure years ago  . COPD (chronic obstructive pulmonary disease) (HCC)   . Depression   . Diabetes mellitus without complication (HCC)   . Diabetic neuropathy (HCC)   . Dyspnea   . Headache    migraines  . Hypertension   . Memory deficit   . Pancreatitis   . Schizophrenia (HCC)   . Stroke [facility](HCC)    no deficits  . Wears dentures   . Wears glasses     Past Surgical History:  Procedure Laterality Date  . ANTERIOR CERVICAL DECOMPRESSION/DISCECTOMY FUSION 4 LEVELS N/A 04/01/2016   Procedure: ANTERIOR CERVICAL DECOMPRESSION/DISCECTOMY FUSION CERVICAL THREE- CERVICAL FOUR, CERVICAL FOUR-CERVICAL FIVE, CORPECTOMY C6;  Surgeon: Lisbeth RenshawNeelesh Nundkumar, MD;  Location: [facility] NEUR[facility] ORS;  Service: Neurosurgery;  Laterality: N/A;  ANTERIOR CERVICAL DECOMPRESSION/DISCECTOMY FUSION C3-C4, C4-C5,C5-C6,C6-C7  . FOOT SURGERY Right   . MULTIPLE TOOTH EXTRACTIONS    . POSTERIOR CERVICAL FUSION/FORAMINOTOMY N/A 06/09/2016   Procedure: Cervical  three-four, four-five, five-six, six-seven posterior cervical fusion;  Surgeon: Lisbeth RenshawNeelesh Nundkumar, MD;  Location: [facility] OR;  Service: Neurosurgery;  Laterality: N/A;  . RHINOPLASTY    . ROTATOR CUFF REPAIR    . TONSILLECTOMY    . TUBAL LIGATION      Allergies as of 10/27/2017      Reactions   No Known Allergies       Medication List        Accurate as of 10/27/17  4:16 PM. Always use your most recent med list.          acetaminophen 325 MG tablet Commonly known as:  TYLENOL Take 650 mg by mouth every 4 (four) hours as needed.   albuterol (2.5 MG/3ML) 0.083% nebulizer solution Commonly known as:  PROVENTIL Take 2.5 mg by nebulization every 6 (six) hours as needed for wheezing or shortness of breath.   amLODipine-benazepril 5-10 MG capsule Commonly known as:  LOTREL Take 1 capsule by mouth daily.   aspirin EC 81 MG tablet Take 81 mg by mouth daily.   BIOFREEZE 4 % Gel Generic drug:  Menthol (Topical Analgesic) Apply 1 application topically 2 (two) times daily as needed. Apply to affected area   cholecalciferol 1000 units tablet Commonly known as:  VITAMIN D Take 1,000 Units by mouth daily.   cyanocobalamin 500 MCG tablet Take 1,000 mcg by mouth daily.   DULoxetine 60 MG capsule Commonly known as:  CYMBALTA Take 60 mg by mouth daily.  GLUCERNA Liqd Take 237 mLs by mouth 2 (two) times daily between meals.   LANTUS SOLOSTAR 100 UNIT/ML Solostar Pen Generic drug:  Insulin Glargine Inject 12 Units into the skin daily at 10 pm.   metFORMIN 750 MG 24 hr tablet Commonly known as:  GLUCOPHAGE-XR Take 750 mg by mouth daily with breakfast.   naproxen sodium 550 MG tablet Commonly known as:  ANAPROX Take 550 mg by mouth 2 (two) times daily as needed for mild pain.   rosuvastatin 20 MG tablet Commonly known as:  CRESTOR Take 20 mg by mouth at bedtime.   tiZANidine 4 MG tablet Commonly known as:  ZANAFLEX Take 4 mg by mouth at bedtime as needed for muscle spasms.     traZODone 150 MG tablet Commonly known as:  DESYREL Take 150 mg by mouth at bedtime.   ziprasidone 80 MG capsule Commonly known as:  GEODON Take 80 mg by mouth at bedtime.       No orders of the defined types were placed in this encounter.   Immunization History  Administered Date(s) Administered  . PPD Test 06/21/2016    Social History   Tobacco Use  . Smoking status: Former Smoker    Types: Cigarettes  . Smokeless tobacco: Never Used  . Tobacco comment: in 2013  Substance Use Topics  . Alcohol use: No    Alcohol/week: 0.0 oz    Family history is   History reviewed. No pertinent family history.    Review of Systems  DATA OBTAINED: from patient, nurse, medical record, family member GENERAL:  no fevers, fatigue, appetite changes SKIN: No itching, or rash EYES: No eye pain, redness, discharge EARS: No earache, tinnitus, change in hearing NOSE: No congestion, drainage or bleeding  MOUTH/THROAT: No mouth or tooth pain, No sore throat RESPIRATORY: No cough, wheezing, SOB CARDIAC: No chest pain, palpitations, lower extremity edema  GI: No abdominal pain, No N/V/D or constipation, No heartburn or reflux  GU: No dysuria, frequency or urgency, or incontinence  MUSCULOSKELETAL: No unrelieved bone/joint pain NEUROLOGIC: No headache, dizziness or focal weakness PSYCHIATRIC: No c/o anxiety or sadness   Vitals:   10/27/17 1544  BP: (!) 162/88  Pulse: 61  Resp: 18  Temp: (!) 97 F (36.1 C)  SpO2: 97%    SpO2 Readings from Last 1 Encounters:  10/27/17 97%   Body mass index is 21.39 kg/m.     Physical Exam  GENERAL APPEARANCE: Alert, conversant,  No acute distress.  SKIN: No diaphoresis rash HEAD: Normocephalic, atraumatic  EYES: Conjunctiva/lids clear. Pupils round, reactive. EOMs intact.  EARS: External exam WNL, canals clear. Hearing grossly normal.  NOSE: No deformity or discharge.  MOUTH/THROAT: Lips w/o lesions  RESPIRATORY: Breathing is even,  unlabored. Lung sounds are clear   CARDIOVASCULAR: Heart RRR no murmurs, rubs or gallops. No peripheral edema.   GASTROINTESTINAL: Abdomen is soft, non-tender, not distended w/ normal bowel sounds. GENITOURINARY: Bladder non tender, not distended  MUSCULOSKELETAL: No abnormal joints or musculature NEUROLOGIC:  Cranial nerves 2-12 grossly intact. Moves all extremities  PSYCHIATRIC: Mood and affect appropriate to situation, no behavioral issues  Patient Active Problem List   Diagnosis Date Noted  . Other secondary kyphosis, cervical region 06/09/2016  . Schizophrenia (HCC) 04/29/2016  . Type 2 diabetes mellitus (HCC) 04/29/2016  . Cervical spondylosis with myelopathy 04/01/2016  . Myelopathy (HCC) 02/19/2016  . Cognitive impairment 02/19/2016  . History of alcohol abuse 02/19/2016      Labs reviewed: Basic Metabolic Panel:  Component Value Date/Time   NA 140 01/25/2017 2247   NA 143 06/24/2016   K 3.5 01/25/2017 2247   CL 103 01/25/2017 2247   CO2 22 01/25/2017 2247   GLUCOSE 198 (H) 01/25/2017 2247   BUN 17 01/25/2017 2247   BUN 10 06/24/2016   CREATININE 1.19 (H) 01/25/2017 2247   CALCIUM 10.1 01/25/2017 2247   AST 20 06/24/2016   ALT 9 06/24/2016   ALKPHOS 72 06/24/2016   GFRNONAA 45 (L) 01/25/2017 2247   GFRAA 52 (L) 01/25/2017 2247    Recent Labs    01/25/17 2247  NA 140  K 3.5  CL 103  CO2 22  GLUCOSE 198*  BUN 17  CREATININE 1.19*  CALCIUM 10.1   Liver Function Tests: No results for input(s): AST, ALT, ALKPHOS, BILITOT, PROT, ALBUMIN in the last 8760 hours. No results for input(s): LIPASE, AMYLASE in the last 8760 hours. No results for input(s): AMMONIA in the last 8760 hours. CBC: Recent Labs    01/25/17 2247  WBC 11.7*  HGB 12.7  HCT 38.5  MCV 83.7  PLT 221   Lipid No results for input(s): CHOL, HDL, LDLCALC, TRIG in the last 8760 hours.  Cardiac Enzymes: No results for input(s): CKTOTAL, CKMB, CKMBINDEX, TROPONINI in the last 8760  hours. BNP: No results for input(s): BNP in the last 8760 hours. No results found for: [facility] Lab Results  Component Value Date   HGBA1C 6.4 07/14/2016   No results found for: TSH No results found for: VITAMINB12 No results found for: FOLATE No results found for: IRON, TIBC, FERRITIN  Imaging and Procedures obtained prior to SNF admission: Dg Cervical Spine 2 Or 3 Views  Result Date: 09/22/2017 CLINICAL DATA:  Posterior neck and right upper extremity pain. No recent injury. EXAM: CERVICAL SPINE - 2-3 VIEW COMPARISON:  Fluoroscopic images of June 09, 2016 and April 01, 2016. FINDINGS: Status post surgical posterior fusion of C3, C4, C5, C6 and C7 bilateral intrapedicular screw placement. Interbody fusion is noted at these levels as well. Patient is also status post surgical anterior fusion of these levels, with screws present at C3, C4, C5 and C7. There is again noted significant withdrawal of the screws at C4 and C5 which is unchanged compared to prior exam of November 2017, but significantly progressed since prior exam of September 2017. IMPRESSION: Status post surgical and posterior fusion of C3-4, C4-5, C5-6 and C6-7. Posterior screws and fixation device appear to be well situated. There is noted significant retraction of the screws anteriorly at C4 and C5 which was present on the prior exam. Some degree of withdrawal of screw at C7 anteriorly is also noted. Electronically Signed   By: Lupita Raider, M.D.   On: 09/22/2017 13:43   Dg Shoulder Right  Result Date: 09/22/2017 CLINICAL DATA:  Right shoulder pain. EXAM: RIGHT SHOULDER - 2+ VIEW COMPARISON:  Radiographs of September 18, 2017. FINDINGS: No acute fracture or dislocation is noted. Old right rib fractures are noted. Narrowing of right subacromial space is noted suggesting rotator cuff injury. Postoperative change or chronic resorption of distal right clavicle is noted. IMPRESSION: Narrowing of right subacromial space is noted  suggesting rotator cuff injury. No acute abnormality is seen in the right shoulder. Electronically Signed   By: Lupita Raider, M.D.   On: 09/22/2017 13:45     Not all labs, radiology exams or other studies done during hospitalization come through on my EPIC note; however they are reviewed by  me.    Assessment and Plan  Randon Goldsmith. Lyn Hollingshead, MD

## 2017-10-28 NOTE — Progress Notes (Signed)
This encounter was created in error - please disregard.

## 2017-12-07 IMAGING — MR MR CERVICAL SPINE WO/W CM
4 of 8 series · 21 of 48 positions shown · IV contrast (multihance)
Comparison: None.

CLINICAL DATA: Myelopathy with hyper reflexia. Cognitive
impairment. History of alcohol abuse.

Creatinine was obtained on site at [HOSPITAL] at [HOSPITAL].Results: Creatinine 1.1 mg/dL.
EXAM:
MRI CERVICAL SPINE WITHOUT AND WITH CONTRAST
TECHNIQUE: Multiplanar and multiecho pulse sequences of the cervical spine, to
include the craniocervical junction and cervicothoracic junction,
were obtained according to standard protocol without and with
intravenous contrast.
CONTRAST:  12 mL MultiHance IV

[Series 5: T1 · sagittal · 3.0mm · 0.41mm/px · 3 of 14 slices shown]
[im 1/14]
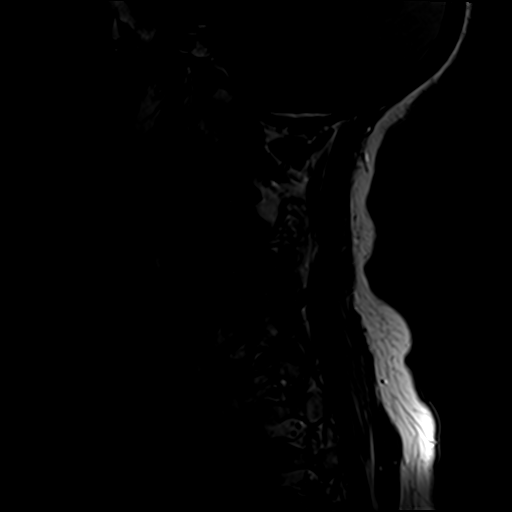
[im 9/14]
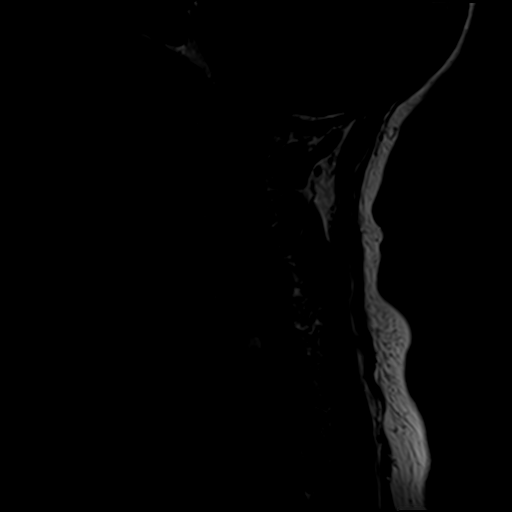
[im 14/14]
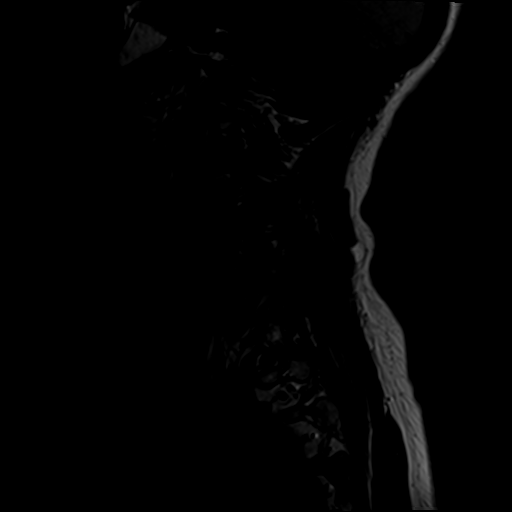

[Series 6: T2 · axial · 3.0mm · 0.39mm/px · z∈[-202,-114]mm · 8 of 25 slices shown (1 of 3)]
[im 1/25]
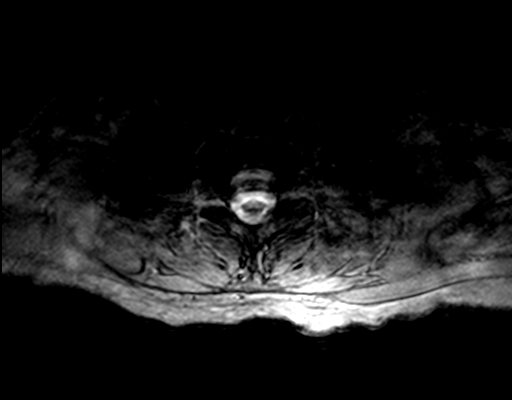
[im 4/25]
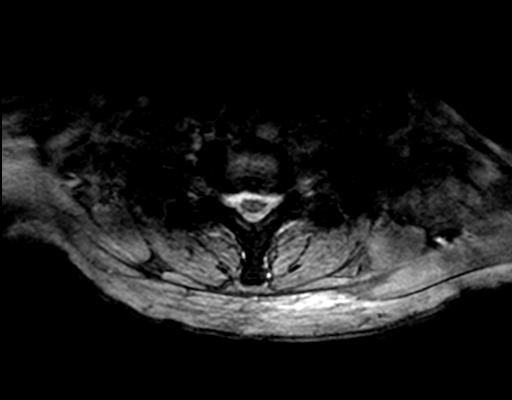
[im 7/25]
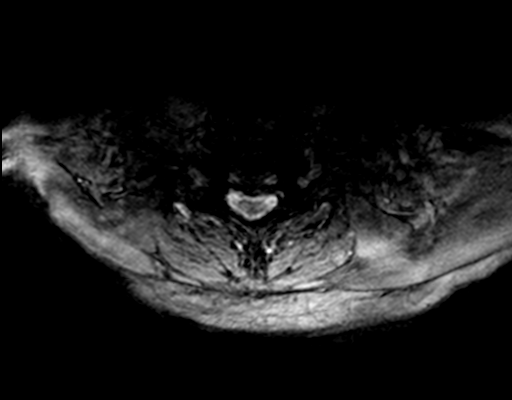
[im 11/25]
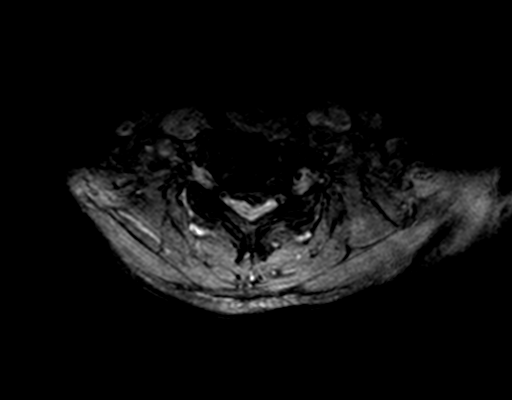
[im 14/25]
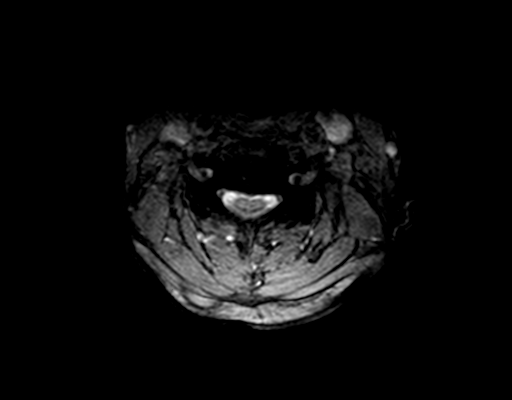
[im 18/25]
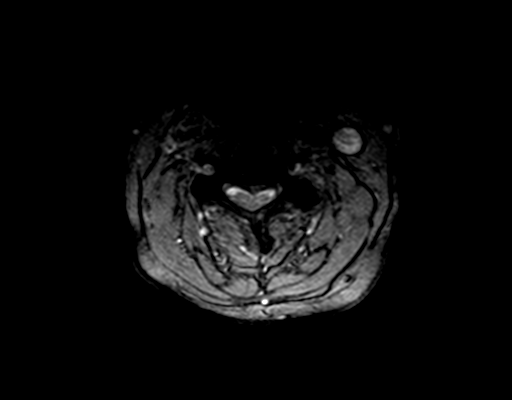
[im 21/25]
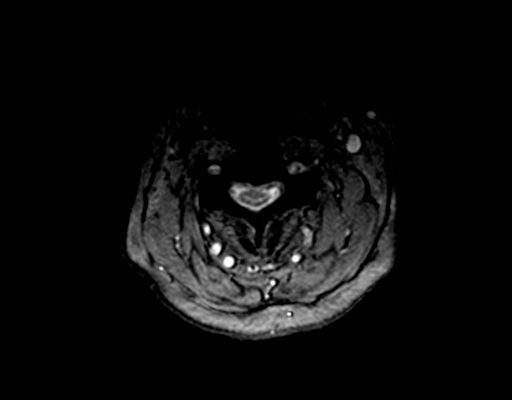
[im 25/25]
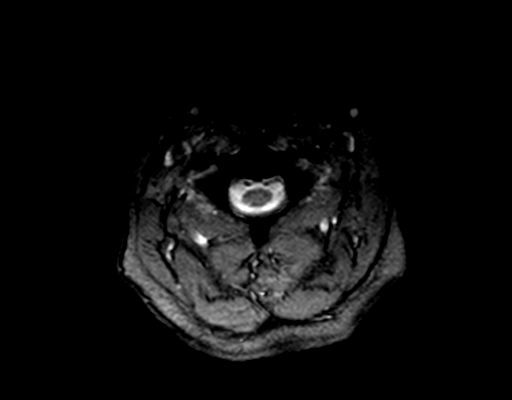

[Series 7: T2 · axial · 3.0mm · 0.39mm/px · z∈[-203,-130]mm · 7 of 25 slices shown (2 of 3)]
[im 1/25]
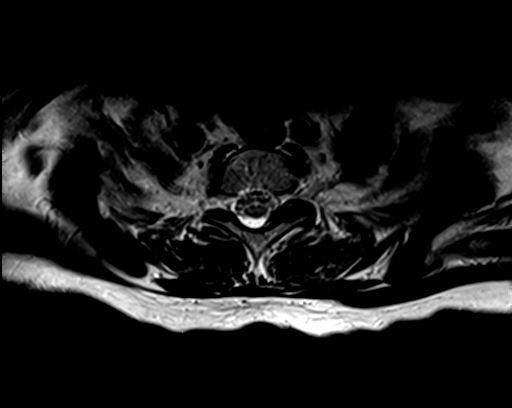
[im 4/25]
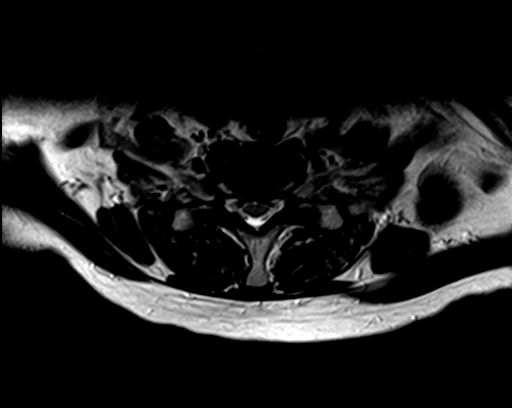
[im 7/25]
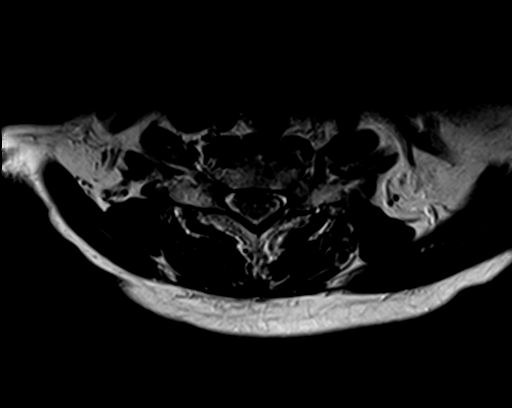
[im 11/25]
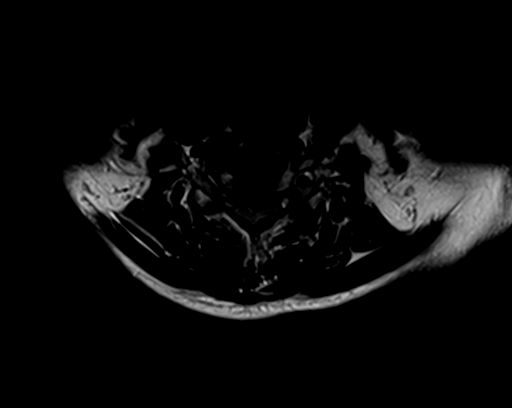
[im 14/25]
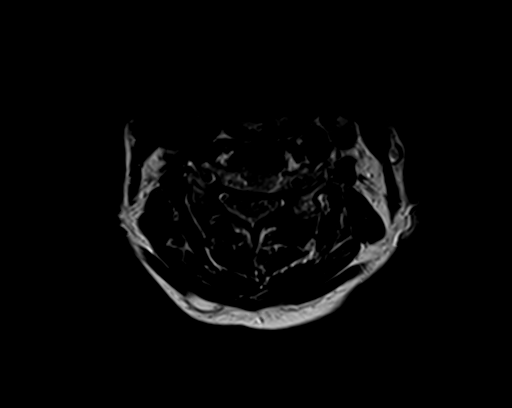
[im 18/25]
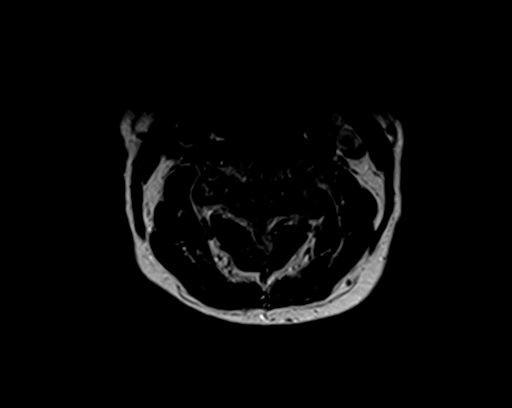
[im 21/25]
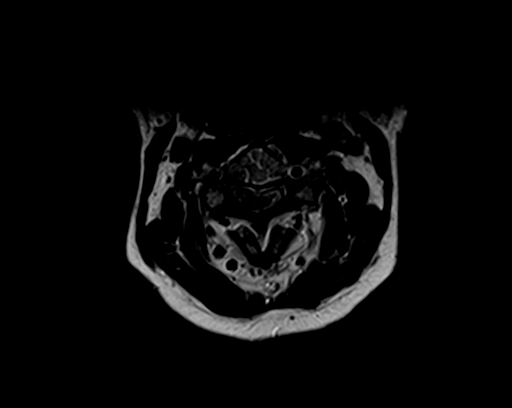

[Series 9: T2 · sagittal · 3.0mm · 0.41mm/px · 3 of 14 slices shown (3 of 3)]
[im 1/14]
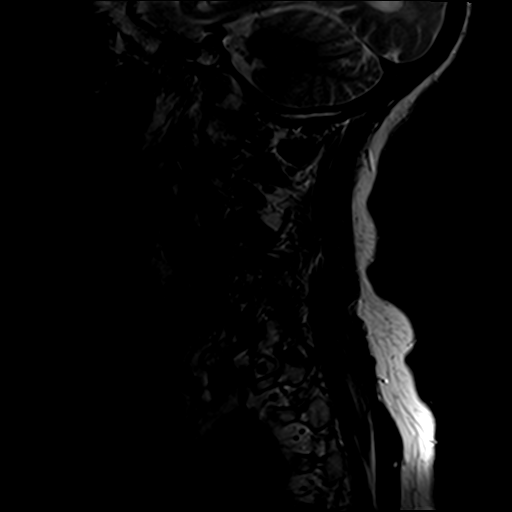
[im 9/14]
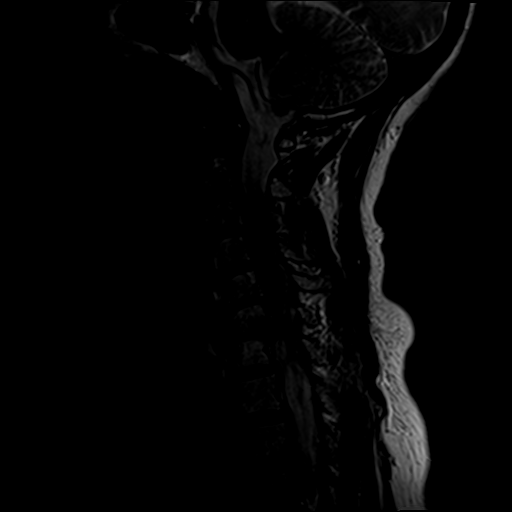
[im 14/14]
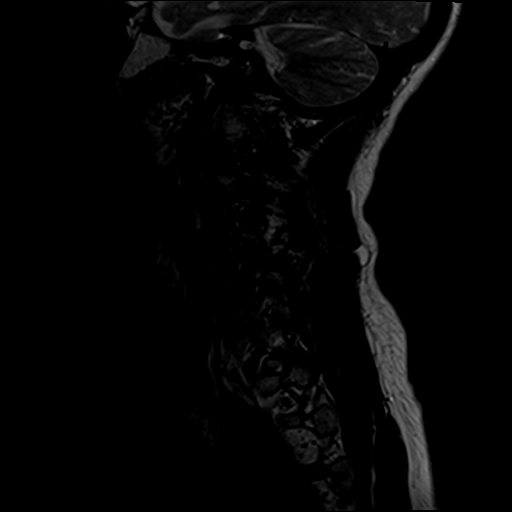

[21 of 48 positions shown; findings below may reference images not displayed]

FINDINGS: Mild anterior listhesis C4-5. Straightening of the cervical lordosis
with kyphosis present.

Negative for fracture.  No vertebral mass lesion.

Extensive hyperintense signal throughout the spinal cord involving
the C5 and C6 levels. This does not show abnormal enhancement. Cord
is atrophic in this area.

Paraspinous muscles and soft tissues are symmetric and within normal
limits.

C2-3:  Mild disc and facet degeneration without significant stenosis

C3-4: Moderately large central disc protrusion with moderate spinal
stenosis and cord flattening. Bilateral facet degeneration with
moderate foraminal encroachment bilaterally.

C4-5: Small central disc protrusion. Left-sided uncinate spurring
and left-sided facet hypertrophy causing moderate left foraminal
encroachment. Mild spinal stenosis

C5-6: Large left paracentral disc protrusion with cord compression
and diffuse cord hyperintensity. Cord is flattened and appears
atrophic. Diffuse uncinate spurring causing foraminal encroachment
bilaterally. Severe spinal stenosis.

C6-7: Disc degeneration with diffuse uncinate spurring. Moderate
spinal stenosis and moderate foraminal encroachment bilaterally.

C7-T1: Bilateral facet hypertrophy causing mild foraminal
encroachment bilaterally. Mild disc bulging.
IMPRESSION: Moderate spinal stenosis and moderate foraminal encroachment
bilaterally at C3-4

Mild spinal stenosis at C4-5 with moderate left foraminal
encroachment

Large left paracentral disc protrusion at C5-6 with severe spinal
stenosis, cord compression, and diffuse cord hyperintensity. Cord
appears atrophic and likely has chronic injury due to spinal
stenosis and possibly cord ischemia.

C6-7 moderate spinal stenosis and moderate foraminal encroachment
bilaterally due to uncinate spurring.

## 2021-09-05 ENCOUNTER — Encounter (HOSPITAL_COMMUNITY): Payer: Self-pay | Admitting: Emergency Medicine

## 2021-09-05 ENCOUNTER — Emergency Department (HOSPITAL_COMMUNITY): Payer: Medicare (Managed Care)

## 2021-09-05 ENCOUNTER — Other Ambulatory Visit: Payer: Self-pay

## 2021-09-05 ENCOUNTER — Emergency Department (HOSPITAL_COMMUNITY)
Admission: EM | Admit: 2021-09-05 | Discharge: 2021-09-05 | Disposition: A | Discharge status: Home / Self Care | Payer: Medicare (Managed Care) | Attending: Emergency Medicine | Admitting: Emergency Medicine

## 2021-09-05 DIAGNOSIS — S6292XA Unspecified fracture of left wrist and hand, initial encounter for closed fracture: Secondary | ICD-10-CM | POA: Diagnosis not present

## 2021-09-05 DIAGNOSIS — W228XXA Striking against or struck by other objects, initial encounter: Secondary | ICD-10-CM | POA: Insufficient documentation

## 2021-09-05 DIAGNOSIS — W19XXXA Unspecified fall, initial encounter: Secondary | ICD-10-CM | POA: Insufficient documentation

## 2021-09-05 DIAGNOSIS — S6992XA Unspecified injury of left wrist, hand and finger(s), initial encounter: Secondary | ICD-10-CM | POA: Diagnosis present

## 2021-09-05 DIAGNOSIS — S62102A Fracture of unspecified carpal bone, left wrist, initial encounter for closed fracture: Secondary | ICD-10-CM

## 2021-09-05 DIAGNOSIS — Z7984 Long term (current) use of oral hypoglycemic drugs: Secondary | ICD-10-CM | POA: Insufficient documentation

## 2021-09-05 DIAGNOSIS — Z79899 Other long term (current) drug therapy: Secondary | ICD-10-CM | POA: Insufficient documentation

## 2021-09-05 DIAGNOSIS — S0083XA Contusion of other part of head, initial encounter: Secondary | ICD-10-CM | POA: Insufficient documentation

## 2021-09-05 DIAGNOSIS — S32010A Wedge compression fracture of first lumbar vertebra, initial encounter for closed fracture: Secondary | ICD-10-CM | POA: Insufficient documentation

## 2021-09-05 DIAGNOSIS — S22000A Wedge compression fracture of unspecified thoracic vertebra, initial encounter for closed fracture: Secondary | ICD-10-CM | POA: Insufficient documentation

## 2021-09-05 NOTE — ED Notes (Signed)
[facility] called for transport needs  

## 2021-09-05 NOTE — Progress Notes (Signed)
Orthopedic Tech Progress Note Patient Details:  REFUGIO MCCONICO 1945/11/15 [id number]  Ortho Devices Type of Ortho Device: Ace wrap, Ulna gutter splint Ortho Device/Splint Location: left Ortho Device/Splint Interventions: Application   Post Interventions Patient Tolerated: Well Instructions Provided: Care of device  Saul Fordyce 09/05/2021, 11:46 AM

## 2021-09-05 NOTE — ED Provider Notes (Signed)
[facility] DEPT Provider Note   CSN: [id number] Arrival date & time: 09/05/21  [id number]     History  Chief Complaint  Patient presents with   Head Injury   Fall    [name] is a 76 y.o. female.  Patient is a 76 year old female who presents with a head injury after a fall.  She states that she was leaning over to pick up a sock and lost her balance, hitting her head on the floor.  She has a hematoma to left side of forehead.  She denies any loss of consciousness.  She is not on anticoagulants.  She also complains of pain in her left wrist and her right hip as well as her low back.  She denies any other injuries from the fall.  She lives at [facility] facility.  History was obtained from the patient and EMS.      Home Medications Prior to Admission medications   Medication Sig Start Date End Date Taking? Authorizing Provider  acetaminophen (TYLENOL) 325 MG tablet Take 650 mg by mouth in the morning and at bedtime.   Yes [provider]  amLODipine-benazepril (LOTREL) 5-10 MG capsule Take 1 capsule by mouth daily.   Yes [provider]  aspirin EC 81 MG tablet Take 81 mg by mouth daily.   Yes [provider]  cyanocobalamin 1000 MCG tablet Take 1,000 mcg by mouth daily.   Yes [provider]  donepezil (ARICEPT) 10 MG tablet Take 10 mg by mouth at bedtime.   Yes [provider]  DULoxetine (CYMBALTA) 60 MG capsule Take 60 mg by mouth daily.   Yes [provider]  GLUCERNA (GLUCERNA) LIQD Take 237 mLs by mouth 2 (two) times daily between meals.   Yes [provider]  Menthol, Topical Analgesic, 4 % GEL Apply 1 application topically daily. Apply to right shoulder for pain   Yes [provider]  metFORMIN (GLUCOPHAGE-XR) 500 MG 24 hr tablet Take 750 mg by mouth in the morning and at bedtime.   Yes [provider]  mirtazapine (REMERON) 15 MG tablet Take 15 mg by  mouth at bedtime.   Yes [provider]  rosuvastatin (CRESTOR) 20 MG tablet Take 20 mg by mouth at bedtime.   Yes [provider]  traZODone (DESYREL) 150 MG tablet Take 150 mg by mouth at bedtime.   Yes [provider]  ziprasidone (GEODON) 80 MG capsule Take 80 mg by mouth at bedtime.    Yes [provider]      Allergies    No known allergies    Review of Systems   Review of Systems  Constitutional:  Negative for chills, diaphoresis, fatigue and fever.  HENT:  Negative for congestion, rhinorrhea and sneezing.   Eyes: Negative.   Respiratory:  Negative for cough, chest tightness and shortness of breath.   Cardiovascular:  Negative for chest pain and leg swelling.  Gastrointestinal:  Negative for abdominal pain, blood in stool, diarrhea, nausea and vomiting.  Genitourinary:  Negative for difficulty urinating, flank pain, frequency and hematuria.  Musculoskeletal:  Positive for arthralgias and back pain.  Skin:  Negative for rash.  Neurological:  Positive for headaches. Negative for dizziness, speech difficulty, weakness and numbness.   Physical Exam Updated Vital Signs BP (!) 161/72 (BP Location: Right Arm)    Pulse 71    Temp 98 F (36.7 C) (Oral)    Resp 18    SpO2  100%  Physical Exam Constitutional:      Appearance: She is well-developed.  HENT:     Head: Normocephalic and atraumatic.     Comments: Hematoma with overlying abrasion noted to the right forehead, no lacerations Eyes:     Pupils: Pupils are equal, round, and reactive to light.  Neck:     Comments: No tenderness to the cervical, thoracic spine.  There is some tenderness to the lower lumbosacral spine, no step-offs or deformities noted Cardiovascular:     Rate and Rhythm: Normal rate and regular rhythm.     Heart sounds: Normal heart sounds.  Pulmonary:     Effort: Pulmonary effort is normal. No respiratory distress.     Breath sounds: Normal breath sounds. No wheezing or  rales.  Chest:     Chest wall: No tenderness.  Abdominal:     General: Bowel sounds are normal.     Palpations: Abdomen is soft.     Tenderness: There is no abdominal tenderness. There is no guarding or rebound.  Musculoskeletal:        General: Normal range of motion.     Comments: There is some ecchymosis and tenderness over the ulnar aspect of the left wrist, no deformity noted, no pain to the hand or forearm.  There is some mild pain in the lateral aspect of the right hip.  No significant pain on range of motion of the hip.  No other pain on palpation or range of motion of the extremities.  Lymphadenopathy:     Cervical: No cervical adenopathy.  Skin:    General: Skin is warm and dry.     Findings: No rash.  Neurological:     Mental Status: She is alert and oriented to person, place, and time.    ED Results / Procedures / Treatments   Labs (all labs ordered are listed, but only abnormal results are displayed) Labs Reviewed - No data to display  EKG None  Radiology DG Wrist Complete Left  Result Date: 09/05/2021 CLINICAL DATA:  Fall.  Wrist and hip pain. EXAM: LEFT WRIST - COMPLETE 3+ VIEW COMPARISON:  None FINDINGS: The bones appear diffusely osteopenic. Mild dorsal soft tissue swelling identified. Small curvilinear ossific density along the dorsum of the wrist is identified which may be seen with underlying triquetral fracture. Degenerative changes are noted at the basilar joint and radiocarpal joint. No dislocation. IMPRESSION: 1. Small curvilinear ossific density along the dorsum of the wrist may be seen with underlying triquetral fracture. 2. Dorsal soft tissue swelling. 3. Osteopenia. Electronically Signed   By: Kerby Moors M.D.   On: 09/05/2021 10:11   CT Head Wo Contrast  Result Date: 09/05/2021 CLINICAL DATA:  Patient was leaning over and fell. Hematoma to left side of forehead. Neck and back pain. EXAM: CT HEAD WITHOUT CONTRAST CT CERVICAL SPINE WITHOUT CONTRAST  TECHNIQUE: Multidetector CT imaging of the head and cervical spine was performed following the standard protocol without intravenous contrast. Multiplanar CT image reconstructions of the cervical spine were also generated. RADIATION DOSE REDUCTION: This exam was performed according to the departmental dose-optimization program which includes automated exposure control, adjustment of the mA and/or kV according to patient size and/or use of iterative reconstruction technique. COMPARISON:  Head CT 01/25/2017. Cervical spine x-ray 06/09/2016 and 09/22/2017 FINDINGS: CT HEAD FINDINGS Brain: There is no evidence for acute hemorrhage, hydrocephalus, mass lesion, or abnormal extra-axial fluid collection. No definite CT evidence for acute infarction. Diffuse loss of parenchymal volume is  consistent with atrophy. Patchy low attenuation in the deep hemispheric and periventricular white matter is nonspecific, but likely reflects chronic microvascular ischemic demyelination. Similar prominence of the lateral ventricles bilaterally likely related to central atrophy. Vascular: No hyperdense vessel or unexpected calcification. Skull: No evidence for fracture. No worrisome lytic or sclerotic lesion. Sinuses/Orbits: The visualized paranasal sinuses and mastoid air cells are clear. Visualized portions of the globes and intraorbital fat are unremarkable. Other: Contusion/hematoma noted left lateral frontal scalp region CT CERVICAL SPINE FINDINGS Alignment: Reversal of normal cervical lordosis. Patient is status post extensive anterior and posterior cervical fusion. Anterior cervical discectomy with interbody graft placement noted at C3-4, C4-5, C5-6, and C6-7. There is mild kyphosis of the midcervical spine, apex at the C5 level. Overall alignment and imaging features on coronal imaging very similar to x-ray of 09/22/2017. Anterior plate extends from the C3 vertebral body to the C7 vertebral body with the C4 and C5 screws retracted and  anterior plate not closely approximated to the anterior cortices of the C4-C6 vertebral bodies, similar to prior. Patient is status post bilateral posterior fusion from C3 to C7 bilaterally. Skull base and vertebrae: CT imaging degraded by beam hardening artifact emanating from anterior posterior fusion hardware, but within this limitation, no acute fracture of the cervical spine can be identified. Soft tissues and spinal canal: No prevertebral fluid or swelling. No visible canal hematoma. Disc levels: As above. Loss of disc height with endplate degeneration noted C7-T1 and markedly advanced degenerative disc and endplate changes noted at T1-2. Upper chest: Unremarkable. Other: None. IMPRESSION: 1. No acute intracranial abnormality. 2. Left lateral frontal scalp contusion/hematoma. 3. Atrophy with chronic small vessel ischemic disease. 4. No evidence for cervical spine fracture or subluxation. 5. Extensive anterior and posterior cervical fusion with interbody graft placement from C3 to C7, as above. There is mild kyphosis of the midcervical spine, apex at the C5 level, with the C4 and C5 screws retracted and anterior plate not closely approximated to the anterior cortices of the C4-C6 vertebral bodies, similar to prior x-ray of 09/22/2017. Electronically Signed   By: Misty Stanley M.D.   On: 09/05/2021 10:16   CT Cervical Spine Wo Contrast  Result Date: 09/05/2021 CLINICAL DATA:  Patient was leaning over and fell. Hematoma to left side of forehead. Neck and back pain. EXAM: CT HEAD WITHOUT CONTRAST CT CERVICAL SPINE WITHOUT CONTRAST TECHNIQUE: Multidetector CT imaging of the head and cervical spine was performed following the standard protocol without intravenous contrast. Multiplanar CT image reconstructions of the cervical spine were also generated. RADIATION DOSE REDUCTION: This exam was performed according to the departmental dose-optimization program which includes automated exposure control, adjustment of  the mA and/or kV according to patient size and/or use of iterative reconstruction technique. COMPARISON:  Head CT 01/25/2017. Cervical spine x-ray 06/09/2016 and 09/22/2017 FINDINGS: CT HEAD FINDINGS Brain: There is no evidence for acute hemorrhage, hydrocephalus, mass lesion, or abnormal extra-axial fluid collection. No definite CT evidence for acute infarction. Diffuse loss of parenchymal volume is consistent with atrophy. Patchy low attenuation in the deep hemispheric and periventricular white matter is nonspecific, but likely reflects chronic microvascular ischemic demyelination. Similar prominence of the lateral ventricles bilaterally likely related to central atrophy. Vascular: No hyperdense vessel or unexpected calcification. Skull: No evidence for fracture. No worrisome lytic or sclerotic lesion. Sinuses/Orbits: The visualized paranasal sinuses and mastoid air cells are clear. Visualized portions of the globes and intraorbital fat are unremarkable. Other: Contusion/hematoma noted left lateral frontal scalp region  CT CERVICAL SPINE FINDINGS Alignment: Reversal of normal cervical lordosis. Patient is status post extensive anterior and posterior cervical fusion. Anterior cervical discectomy with interbody graft placement noted at C3-4, C4-5, C5-6, and C6-7. There is mild kyphosis of the midcervical spine, apex at the C5 level. Overall alignment and imaging features on coronal imaging very similar to x-ray of 09/22/2017. Anterior plate extends from the C3 vertebral body to the C7 vertebral body with the C4 and C5 screws retracted and anterior plate not closely approximated to the anterior cortices of the C4-C6 vertebral bodies, similar to prior. Patient is status post bilateral posterior fusion from C3 to C7 bilaterally. Skull base and vertebrae: CT imaging degraded by beam hardening artifact emanating from anterior posterior fusion hardware, but within this limitation, no acute fracture of the cervical spine  can be identified. Soft tissues and spinal canal: No prevertebral fluid or swelling. No visible canal hematoma. Disc levels: As above. Loss of disc height with endplate degeneration noted C7-T1 and markedly advanced degenerative disc and endplate changes noted at T1-2. Upper chest: Unremarkable. Other: None. IMPRESSION: 1. No acute intracranial abnormality. 2. Left lateral frontal scalp contusion/hematoma. 3. Atrophy with chronic small vessel ischemic disease. 4. No evidence for cervical spine fracture or subluxation. 5. Extensive anterior and posterior cervical fusion with interbody graft placement from C3 to C7, as above. There is mild kyphosis of the midcervical spine, apex at the C5 level, with the C4 and C5 screws retracted and anterior plate not closely approximated to the anterior cortices of the C4-C6 vertebral bodies, similar to prior x-ray of 09/22/2017. Electronically Signed   By: Misty Stanley M.D.   On: 09/05/2021 10:16   CT Lumbar Spine Wo Contrast  Result Date: 09/05/2021 CLINICAL DATA:  Back trauma.  Status post fall. EXAM: CT LUMBAR SPINE WITHOUT CONTRAST TECHNIQUE: Multidetector CT imaging of the lumbar spine was performed without intravenous contrast administration. Multiplanar CT image reconstructions were also generated. RADIATION DOSE REDUCTION: This exam was performed according to the departmental dose-optimization program which includes automated exposure control, adjustment of the mA and/or kV according to patient size and/or use of iterative reconstruction technique. COMPARISON:  None. FINDINGS: Segmentation: 5 lumbar type vertebrae. Alignment: Grade 1 anterolisthesis of L2 on L3 secondary to facet disease. Vertebrae: Acute T12 vertebral body compression fracture with approximately 30% height loss. Acute L1 vertebral body compression fracture with approximately 20% height loss. Generalized osteopenia. No aggressive osseous lesion. No discitis or osteomyelitis. Paraspinal and other soft  tissues: Abdominal aortic atherosclerosis. Partially and suboptimally visualized peripherally calcified left mid abdominal mass concerning for a possible splenic artery aneurysm measuring at least 4 cm. Other: None Disc levels: Disc spaces: Disc spaces are maintained. T12-L1: No disc protrusion, foraminal stenosis or central canal stenosis. Moderate bilateral facet arthropathy. L1-L2: No disc protrusion, foraminal stenosis or central canal stenosis. Moderate bilateral facet arthropathy. L2-L3: Mild broad-based disc bulge. Severe bilateral facet arthropathy. Moderate spinal stenosis. No foraminal stenosis. L3-L4: Mild broad-based disc bulge. Severe bilateral facet arthropathy. Moderate spinal stenosis. No foraminal stenosis. L4-L5: Broad-based disc bulge. Severe bilateral facet arthropathy. Moderate spinal stenosis. No foraminal stenosis. L5-S1: Mild broad-based disc bulge. Severe bilateral facet arthropathy. No foraminal stenosis. No significant central canal stenosis. IMPRESSION: 1. Acute T12 vertebral body compression fracture with approximately 30% height loss. 2. Acute L1 vertebral body compression fracture with approximately 20% height loss. 3. Lumbar spine spondylosis as above. 4. Partially and suboptimally visualized peripherally calcified left mid abdominal mass concerning for a possible splenic artery  aneurysm measuring at least 4 cm. Recommend further evaluation with a nonemergent CT of the abdomen with intravenous contrast. 5. Aortic Atherosclerosis (ICD10-I70.0). Electronically Signed   By: Kathreen Devoid M.D.   On: 09/05/2021 09:58   DG Hip Unilat W or Wo Pelvis 2-3 Views Right  Result Date: 09/05/2021 CLINICAL DATA:  Fall.  Pain. EXAM: DG HIP (WITH OR WITHOUT PELVIS) 2-3V RIGHT COMPARISON:  None. FINDINGS: Mild degenerative changes in the hips without loss of joint space. No fracture or dislocation identified. Calcified atherosclerosis is seen in the femoral vessels bilaterally. IMPRESSION: No acute  abnormality.  No fracture. Electronically Signed   By: Dorise Bullion III M.D.   On: 09/05/2021 10:11    Procedures Procedures    Medications Ordered in ED Medications - No data to display  ED Course/ Medical Decision Making/ A&P                           Medical Decision Making Problems Addressed: Closed fracture of left wrist, initial encounter: acute illness or injury Compression fracture of body of thoracic vertebra [facility]): acute illness or injury Compression fracture of L1 vertebra, initial encounter [facility]): acute illness or injury  Amount and/or Complexity of Data Reviewed Independent Historian:     Details: daughter External Data Reviewed: notes. Radiology: ordered and independent interpretation performed. Decision-making details documented in ED Course.   Patient presents after mechanical fall.  She has evidence of some minor head trauma.  She is at her baseline mental status per family.  Her daughter is at bedside.  She had a CT scan of her head and cervical spine which shows no acute abnormalities.  CT scan of the lumbosacral spine show compression fractures at T11 and L1.  However patient does not have significant tenderness to these areas.  She also was noted to have a fracture of the triquetrum on her left wrist x-rays.  These were interpreted by me as well as the radiologist.  No other injuries were identified.  Of note on her CT of her lumbar spine, there was an injury evaluated masslike structure and around her spleen.  There is suspicion for a splenic aneurysm.  Radiologist recommended CT with contrast on nonemergent basis.  I discussed this with the patient and her daughter.  I did offer that we could do it today but patient does not want to stay for an IV and blood work.  She will arrange to have this done with her PCP.  I did consult with the physician on-call with [facility].  I notified her of the findings and that she will need a follow-up CT scan and  orthopedic follow-up for her wrist.  I also discussed this with the patient and patient's daughter.  She was placed in a ulnar gutter splint by the [employer].  Final Clinical Impression(s) / ED Diagnoses Final diagnoses:  Closed fracture of left wrist, initial encounter  Compression fracture of body of thoracic vertebra (HCC)  Compression fracture of L1 vertebra, initial encounter [facility])    Rx / Arial Orders ED Discharge Orders     None         Malvin Johns, MD 09/05/21 1233

## 2021-09-05 NOTE — ED Triage Notes (Signed)
BIBA Per EMS: Pt coming from [facility] w/ c/o fall after leaning over to pick up sock  Hit head on floor & now has hematoma on left side of forehead Denies LOC, denies blood thinners 142/82 98HR  18  99% RA 188 CBG  Clear stroke screen  A&O x4

## 2021-09-05 NOTE — ED Notes (Signed)
Pt continuing to ask for bedpan. Pt placed on bedpan multiple times and pt unable to use the bedpan.

## 2021-09-05 NOTE — Discharge Instructions (Addendum)
You will need to follow-up with an orthopedist regarding your wrist fracture.  You will need to follow-up with your primary care doctor regarding the mass around your spleen.  You need to have a CT scan of that area to further assess.  I have discussed this with your primary care doctor.  Return to emergency if you have any worsening symptoms.

## 2021-09-06 ENCOUNTER — Other Ambulatory Visit: Payer: Self-pay | Admitting: Nurse Practitioner

## 2021-09-06 ENCOUNTER — Other Ambulatory Visit (HOSPITAL_COMMUNITY): Payer: Self-pay | Admitting: Nurse Practitioner

## 2021-09-06 DIAGNOSIS — R1909 Other intra-abdominal and pelvic swelling, mass and lump: Secondary | ICD-10-CM

## 2021-09-13 ENCOUNTER — Encounter (HOSPITAL_COMMUNITY): Payer: Self-pay

## 2021-09-13 ENCOUNTER — Ambulatory Visit (HOSPITAL_COMMUNITY): Payer: Medicare (Managed Care)

## 2021-09-24 ENCOUNTER — Ambulatory Visit (HOSPITAL_COMMUNITY)
Admission: RE | Admit: 2021-09-24 | Discharge: 2021-09-24 | Disposition: A | Discharge status: Home / Self Care | Payer: Medicare (Managed Care) | Source: Ambulatory Visit | Attending: Nurse Practitioner | Admitting: Nurse Practitioner

## 2021-09-24 ENCOUNTER — Other Ambulatory Visit: Payer: Self-pay

## 2021-09-24 DIAGNOSIS — R1909 Other intra-abdominal and pelvic swelling, mass and lump: Secondary | ICD-10-CM | POA: Diagnosis present

## 2021-09-24 LAB — POCT I-STAT CREATININE: Creatinine, Ser: 1.5 mg/dL — ABNORMAL HIGH (ref 0.44–1.00)

## 2021-09-24 MED ORDER — SODIUM CHLORIDE (PF) 0.9 % IJ SOLN
INTRAMUSCULAR | Status: AC
  Filled 2021-09-24: qty 50

## 2021-09-24 MED ORDER — IOHEXOL 350 MG/ML SOLN
100.0000 mL | Freq: Once | INTRAVENOUS | Status: AC | PRN
  Administered 2021-09-24: 80 mL via INTRAVENOUS

## 2021-10-08 ENCOUNTER — Encounter: Payer: Self-pay | Admitting: Surgery

## 2021-10-08 ENCOUNTER — Ambulatory Visit (INDEPENDENT_AMBULATORY_CARE_PROVIDER_SITE_OTHER): Payer: Medicare (Managed Care) | Admitting: Surgery

## 2021-10-08 ENCOUNTER — Other Ambulatory Visit: Payer: Self-pay

## 2021-10-08 VITALS — BP 123/82 | HR 68 | Temp 97.7°F | Resp 18 | Ht 64.0 in | Wt 124.0 lb

## 2021-10-08 DIAGNOSIS — I728 Aneurysm of other specified arteries: Secondary | ICD-10-CM

## 2021-10-08 NOTE — Progress Notes (Signed)
? ?[facility] [place] ? ?Patient name: [name] MRN: [id number] DOB: 1945/10/05 Sex: female ? ? ?REQUESTING PROVIDER:  ? ? Dr. Dorothe Pea ? ? ?REASON FOR CONSULT:  ?  ?Splenic mass ? ?HISTORY OF PRESENT ILLNESS:  ? ?[name] is a 76 y.o. female, who is referred for evaluation of a splenic mass.  This was found incidentally on imaging for evaluation of a fall.  She denies any abdominal pain.  She has no prior knowledge of this. ? ?Patient does suffer from diabetes.  She has schizophrenia.  She has had a stroke in the past with no residual deficits.  She is medically managed for hypertension. ? ?PAST MEDICAL HISTORY  ? ? ?Past Medical History:  ?Diagnosis Date  ? Anxiety   ? Cervical spondylosis with myelopathy   ? Complication of anesthesia   ? hard time waking up with last procedure years ago  ? COPD (chronic obstructive pulmonary disease) (HCC)   ? Depression   ? Diabetes mellitus without complication (HCC)   ? Diabetic neuropathy (HCC)   ? Dyspnea   ? Headache   ? migraines  ? Hypertension   ? Memory deficit   ? Pancreatitis   ? Schizophrenia (HCC)   ? Stroke [facility])   ? no deficits  ? Wears dentures   ? Wears glasses   ? ? ? ?FAMILY HISTORY  ? ?History reviewed. No pertinent family history. ? ?SOCIAL HISTORY:  ? ?Social History  ? ?Socioeconomic History  ? Marital status: Widowed  ?  Spouse name: Not on file  ? Number of children: Not on file  ? Years of education: Not on file  ? Highest education level: Not on file  ?Occupational History  ? Not on file  ?Tobacco Use  ? Smoking status: Former  ?  Types: Cigarettes  ? Smokeless tobacco: Never  ? Tobacco comments:  ?  in 2013  ?Substance and Sexual Activity  ? Alcohol use: No  ?  Alcohol/week: 0.0 standard drinks  ? Drug use: No  ? Sexual activity: Not on file  ?Other Topics Concern  ? Not on file  ?Social History Narrative  ? Lives with 2 sisters in a one story home.  Retired from [employer]  work.  Education: GED   ? ?Social Determinants of Health  ? ?Financial Resource Strain: Not on file  ?Food Insecurity: Not on file  ?Transportation Needs: Not on file  ?Physical Activity: Not on file  ?Stress: Not on file  ?Social Connections: Not on file  ?Intimate Partner Violence: Not on file  ? ? ?ALLERGIES:  ? ? ?Allergies  ?Allergen Reactions  ? No Known Allergies   ? ? ?CURRENT MEDICATIONS:  ? ? ?Current Outpatient Medications  ?Medication Sig Dispense Refill  ? acetaminophen (TYLENOL) 325 MG tablet Take 650 mg by mouth in the morning and at bedtime.    ? amLODipine-benazepril (LOTREL) 5-10 MG capsule Take 1 capsule by mouth daily.    ? aspirin EC 81 MG tablet Take 81 mg by mouth daily.    ? cyanocobalamin 1000 MCG tablet Take 1,000 mcg by mouth daily.    ? donepezil (ARICEPT) 10 MG tablet Take 10 mg by mouth at bedtime.    ? DULoxetine (CYMBALTA) 60 MG capsule Take 60 mg by mouth daily.    ? GLUCERNA (GLUCERNA) LIQD Take 237 mLs by mouth 2 (two) times daily between meals.    ? Menthol, Topical Analgesic, 4 % GEL Apply 1  application topically daily. Apply to right shoulder for pain    ? metFORMIN (GLUCOPHAGE-XR) 500 MG 24 hr tablet Take 750 mg by mouth in the morning and at bedtime.    ? mirtazapine (REMERON) 15 MG tablet Take 15 mg by mouth at bedtime.    ? rosuvastatin (CRESTOR) 20 MG tablet Take 20 mg by mouth at bedtime.    ? traZODone (DESYREL) 150 MG tablet Take 150 mg by mouth at bedtime.    ? ziprasidone (GEODON) 80 MG capsule Take 80 mg by mouth at bedtime.     ? ?No current facility-administered medications for this visit.  ? ? ?REVIEW OF SYSTEMS:  ? ?[X]  denotes positive finding, [ ]  denotes negative finding ?Cardiac  Comments:  ?Chest pain or chest pressure:    ?Shortness of breath upon exertion:    ?Short of breath when lying flat:    ?Irregular heart rhythm:    ?    ?Vascular    ?Pain in calf, thigh, or hip brought on by ambulation:    ?Pain in feet at night that wakes you up from your sleep:      ?Blood clot in your veins:    ?Leg swelling:     ?    ?Pulmonary    ?Oxygen at home:    ?Productive cough:     ?Wheezing:     ?    ?Neurologic    ?Sudden weakness in arms or legs:     ?Sudden numbness in arms or legs:     ?Sudden onset of difficulty speaking or slurred speech:    ?Temporary loss of vision in one eye:     ?Problems with dizziness:     ?    ?Gastrointestinal    ?Blood in stool:     ? ?Vomited blood:     ?    ?Genitourinary    ?Burning when urinating:     ?Blood in urine:    ?    ?Psychiatric    ?Major depression:     ?    ?Hematologic    ?Bleeding problems:    ?Problems with blood clotting too easily:    ?    ?Skin    ?Rashes or ulcers:    ?    ?Constitutional    ?Fever or chills:    ? ?PHYSICAL EXAM:  ? ?Vitals:  ? 10/08/21 1148  ?BP: 123/82  ?Pulse: 68  ?Resp: 18  ?Temp: 97.7 ?F (36.5 ?C)  ?SpO2: 98%  ?Weight: 124 lb (56.2 kg)  ?Height: 5\' 4"  (1.626 m)  ? ? ?GENERAL: The patient is a well-nourished female, in no acute distress. The vital signs are documented above. ?CARDIAC: There is a regular rate and rhythm.  ?PULMONARY: Nonlabored respirations ?ABDOMEN: Soft.  Tender to deep palpation ?MUSCULOSKELETAL: There are no major deformities or cyanosis. ?NEUROLOGIC: No focal weakness or paresthesias are detected. ?SKIN: There are no ulcers or rashes noted. ?PSYCHIATRIC: The patient has a normal affect. ? ?STUDIES:  ? ?I have reviewed her CT scan with the following findings: ?VASCULAR ?  ?1. 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 The aneurysm ?causes mass effect on the central superior mesenteric and splenic ?veins ?2.  Aortic Atherosclerosis (ICD10-I70.0). ?  ?NON-VASCULAR ?  ?1. Unchanged mild anterior wedge compression deformity of L1 ?vertebral body. ?2. Large rectal stool ball. ?ASSESSMENT and PLAN  ? ?Left upper quadrant mass: This does appear to resemble a large splenic artery  aneurysm.  It is difficult on the images to  identify where it communicates with the main splenic artery.  However the mass has thrombosed and there is no active blood flow.  Therefore, I would not recommend any intervention at this time which would have to be resection.  I think that this should be reevaluated in 6 months to make sure that it is not undergoing any kind of enlargement.  If not, I would recommend just observation without surgical resection.  This was discussed with the patient, and her 2 daughters.  They are in agreement. ? ? ?Durene Cal, IV, MD, FACS ?[facility] [place] ?Tel [phone] ?Pager [phone]  ?

## 2021-10-11 ENCOUNTER — Other Ambulatory Visit: Payer: Self-pay | Admitting: Nurse Practitioner

## 2021-10-18 ENCOUNTER — Other Ambulatory Visit (HOSPITAL_COMMUNITY): Payer: Self-pay | Admitting: Nurse Practitioner

## 2021-10-18 ENCOUNTER — Other Ambulatory Visit: Payer: Self-pay | Admitting: Nurse Practitioner

## 2021-10-18 DIAGNOSIS — F0150 Vascular dementia without behavioral disturbance: Secondary | ICD-10-CM

## 2022-03-15 ENCOUNTER — Other Ambulatory Visit: Payer: Self-pay

## 2022-03-15 DIAGNOSIS — I728 Aneurysm of other specified arteries: Secondary | ICD-10-CM

## 2022-03-24 ENCOUNTER — Ambulatory Visit (HOSPITAL_COMMUNITY)
Admission: RE | Admit: 2022-03-24 | Discharge: 2022-03-24 | Disposition: A | Discharge status: Home / Self Care | Payer: Medicare (Managed Care) | Source: Ambulatory Visit | Attending: Nurse Practitioner | Admitting: Nurse Practitioner

## 2022-03-24 DIAGNOSIS — F0150 Vascular dementia without behavioral disturbance: Secondary | ICD-10-CM | POA: Diagnosis present

## 2022-03-24 LAB — POCT I-STAT CREATININE: Creatinine, Ser: 1.4 mg/dL — ABNORMAL HIGH (ref 0.44–1.00)

## 2022-03-24 MED ORDER — IOHEXOL 350 MG/ML SOLN
100.0000 mL | Freq: Once | INTRAVENOUS | Status: AC | PRN
  Administered 2022-03-24: 100 mL via INTRAVENOUS

## 2022-03-28 ENCOUNTER — Ambulatory Visit: Payer: Medicare (Managed Care) | Admitting: Surgery

## 2022-04-18 ENCOUNTER — Ambulatory Visit: Payer: Medicare (Managed Care) | Admitting: Surgery

## 2022-12-22 ENCOUNTER — Other Ambulatory Visit: Payer: Self-pay

## 2022-12-22 ENCOUNTER — Emergency Department (HOSPITAL_COMMUNITY): Payer: Medicare (Managed Care)

## 2022-12-22 ENCOUNTER — Inpatient Hospital Stay (HOSPITAL_COMMUNITY)
Admission: EM | Admit: 2022-12-22 | Discharge: 2022-12-27 | DRG: 637 | Disposition: A | Discharge status: Skilled Nursing Facility | Payer: Medicare (Managed Care) | Attending: Internal Medicine | Admitting: Internal Medicine

## 2022-12-22 ENCOUNTER — Inpatient Hospital Stay (HOSPITAL_COMMUNITY): Payer: Medicare (Managed Care)

## 2022-12-22 DIAGNOSIS — E1140 Type 2 diabetes mellitus with diabetic neuropathy, unspecified: Secondary | ICD-10-CM | POA: Diagnosis present

## 2022-12-22 DIAGNOSIS — F0390 Unspecified dementia without behavioral disturbance: Secondary | ICD-10-CM | POA: Diagnosis present

## 2022-12-22 DIAGNOSIS — G9341 Metabolic encephalopathy: Secondary | ICD-10-CM | POA: Diagnosis present

## 2022-12-22 DIAGNOSIS — D649 Anemia, unspecified: Secondary | ICD-10-CM | POA: Diagnosis present

## 2022-12-22 DIAGNOSIS — N179 Acute kidney failure, unspecified: Secondary | ICD-10-CM | POA: Diagnosis not present

## 2022-12-22 DIAGNOSIS — Z87891 Personal history of nicotine dependence: Secondary | ICD-10-CM | POA: Diagnosis not present

## 2022-12-22 DIAGNOSIS — Z981 Arthrodesis status: Secondary | ICD-10-CM

## 2022-12-22 DIAGNOSIS — E1110 Type 2 diabetes mellitus with ketoacidosis without coma: Secondary | ICD-10-CM | POA: Diagnosis present

## 2022-12-22 DIAGNOSIS — F32A Depression, unspecified: Secondary | ICD-10-CM | POA: Diagnosis present

## 2022-12-22 DIAGNOSIS — F209 Schizophrenia, unspecified: Secondary | ICD-10-CM | POA: Diagnosis present

## 2022-12-22 DIAGNOSIS — Z91018 Allergy to other foods: Secondary | ICD-10-CM

## 2022-12-22 DIAGNOSIS — Z7984 Long term (current) use of oral hypoglycemic drugs: Secondary | ICD-10-CM | POA: Diagnosis not present

## 2022-12-22 DIAGNOSIS — I10 Essential (primary) hypertension: Secondary | ICD-10-CM

## 2022-12-22 DIAGNOSIS — I672 Cerebral atherosclerosis: Secondary | ICD-10-CM | POA: Diagnosis present

## 2022-12-22 DIAGNOSIS — E1122 Type 2 diabetes mellitus with diabetic chronic kidney disease: Secondary | ICD-10-CM | POA: Diagnosis present

## 2022-12-22 DIAGNOSIS — E860 Dehydration: Secondary | ICD-10-CM | POA: Diagnosis present

## 2022-12-22 DIAGNOSIS — E870 Hyperosmolality and hypernatremia: Secondary | ICD-10-CM | POA: Diagnosis present

## 2022-12-22 DIAGNOSIS — E1101 Type 2 diabetes mellitus with hyperosmolarity with coma: Secondary | ICD-10-CM

## 2022-12-22 DIAGNOSIS — Z7401 Bed confinement status: Secondary | ICD-10-CM

## 2022-12-22 DIAGNOSIS — Z794 Long term (current) use of insulin: Secondary | ICD-10-CM

## 2022-12-22 DIAGNOSIS — E538 Deficiency of other specified B group vitamins: Secondary | ICD-10-CM | POA: Diagnosis present

## 2022-12-22 DIAGNOSIS — Z7982 Long term (current) use of aspirin: Secondary | ICD-10-CM

## 2022-12-22 DIAGNOSIS — E871 Hypo-osmolality and hyponatremia: Secondary | ICD-10-CM | POA: Diagnosis not present

## 2022-12-22 DIAGNOSIS — E1100 Type 2 diabetes mellitus with hyperosmolarity without nonketotic hyperglycemic-hyperosmolar coma (NKHHC): Principal | ICD-10-CM | POA: Diagnosis present

## 2022-12-22 DIAGNOSIS — F419 Anxiety disorder, unspecified: Secondary | ICD-10-CM | POA: Diagnosis present

## 2022-12-22 DIAGNOSIS — E785 Hyperlipidemia, unspecified: Secondary | ICD-10-CM | POA: Diagnosis present

## 2022-12-22 DIAGNOSIS — N1832 Chronic kidney disease, stage 3b: Secondary | ICD-10-CM | POA: Diagnosis present

## 2022-12-22 DIAGNOSIS — F203 Undifferentiated schizophrenia: Secondary | ICD-10-CM | POA: Diagnosis not present

## 2022-12-22 DIAGNOSIS — I6523 Occlusion and stenosis of bilateral carotid arteries: Secondary | ICD-10-CM | POA: Diagnosis not present

## 2022-12-22 DIAGNOSIS — I63312 Cerebral infarction due to thrombosis of left middle cerebral artery: Secondary | ICD-10-CM | POA: Diagnosis not present

## 2022-12-22 DIAGNOSIS — Z8673 Personal history of transient ischemic attack (TIA), and cerebral infarction without residual deficits: Secondary | ICD-10-CM | POA: Diagnosis not present

## 2022-12-22 DIAGNOSIS — I2609 Other pulmonary embolism with acute cor pulmonale: Secondary | ICD-10-CM | POA: Diagnosis not present

## 2022-12-22 DIAGNOSIS — Z66 Do not resuscitate: Secondary | ICD-10-CM | POA: Diagnosis present

## 2022-12-22 DIAGNOSIS — I129 Hypertensive chronic kidney disease with stage 1 through stage 4 chronic kidney disease, or unspecified chronic kidney disease: Secondary | ICD-10-CM | POA: Diagnosis present

## 2022-12-22 DIAGNOSIS — Z79899 Other long term (current) drug therapy: Secondary | ICD-10-CM

## 2022-12-22 DIAGNOSIS — E119 Type 2 diabetes mellitus without complications: Secondary | ICD-10-CM

## 2022-12-22 DIAGNOSIS — Z1152 Encounter for screening for COVID-19: Secondary | ICD-10-CM

## 2022-12-22 DIAGNOSIS — J449 Chronic obstructive pulmonary disease, unspecified: Secondary | ICD-10-CM | POA: Diagnosis present

## 2022-12-22 DIAGNOSIS — I639 Cerebral infarction, unspecified: Secondary | ICD-10-CM

## 2022-12-22 LAB — OSMOLALITY: Osmolality: 359 mOsm/kg (ref 275–295)

## 2022-12-22 LAB — I-STAT VENOUS BLOOD GAS, ED
Acid-base deficit: 6 mmol/L — ABNORMAL HIGH (ref 0.0–2.0)
Bicarbonate: 19.4 mmol/L — ABNORMAL LOW (ref 20.0–28.0)
Calcium, Ion: 1.36 mmol/L (ref 1.15–1.40)
HCT: 39 % (ref 36.0–46.0)
Hemoglobin: 13.3 g/dL (ref 12.0–15.0)
O2 Saturation: 90 %
Potassium: 4 mmol/L (ref 3.5–5.1)
Sodium: 152 mmol/L — ABNORMAL HIGH (ref 135–145)
TCO2: 21 mmol/L — ABNORMAL LOW (ref 22–32)
pCO2, Ven: 37 mmHg — ABNORMAL LOW (ref 44–60)
pH, Ven: 7.328 (ref 7.25–7.43)
pO2, Ven: 64 mmHg — ABNORMAL HIGH (ref 32–45)

## 2022-12-22 LAB — CBG MONITORING, ED
Glucose-Capillary: 576 mg/dL (ref 70–99)
Glucose-Capillary: 435 mg/dL — ABNORMAL HIGH (ref 70–99)
Glucose-Capillary: 475 mg/dL — ABNORMAL HIGH (ref 70–99)
Glucose-Capillary: 388 mg/dL — ABNORMAL HIGH (ref 70–99)

## 2022-12-22 LAB — CBC WITH DIFFERENTIAL/PLATELET
Abs Immature Granulocytes: 0.07 10*3/uL (ref 0.00–0.07)
Basophils Absolute: 0.1 10*3/uL (ref 0.0–0.1)
Basophils Relative: 0 %
Eosinophils Absolute: 0 10*3/uL (ref 0.0–0.5)
Eosinophils Relative: 0 %
HCT: 41.9 % (ref 36.0–46.0)
Hemoglobin: 12.5 g/dL (ref 12.0–15.0)
Immature Granulocytes: 1 %
Lymphocytes Relative: 15 %
Lymphs Abs: 2.1 10*3/uL (ref 0.7–4.0)
MCH: 27.9 pg (ref 26.0–34.0)
MCHC: 29.8 g/dL — ABNORMAL LOW (ref 30.0–36.0)
MCV: 93.5 fL (ref 80.0–100.0)
Monocytes Absolute: 1 10*3/uL (ref 0.1–1.0)
Monocytes Relative: 7 %
Neutro Abs: 11 10*3/uL — ABNORMAL HIGH (ref 1.7–7.7)
Neutrophils Relative %: 77 %
Platelets: 172 10*3/uL (ref 150–400)
RBC: 4.48 MIL/uL (ref 3.87–5.11)
RDW: 14.2 % (ref 11.5–15.5)
WBC: 14.2 10*3/uL — ABNORMAL HIGH (ref 4.0–10.5)
nRBC: 0 % (ref 0.0–0.2)

## 2022-12-22 LAB — BETA-HYDROXYBUTYRIC ACID: Beta-Hydroxybutyric Acid: 0.34 mmol/L — ABNORMAL HIGH (ref 0.05–0.27)

## 2022-12-22 LAB — BASIC METABOLIC PANEL
Anion gap: 12 (ref 5–15)
BUN: 66 mg/dL — ABNORMAL HIGH (ref 8–23)
CO2: 22 mmol/L (ref 22–32)
Calcium: 10 mg/dL (ref 8.9–10.3)
Chloride: 119 mmol/L — ABNORMAL HIGH (ref 98–111)
Creatinine, Ser: 2.86 mg/dL — ABNORMAL HIGH (ref 0.44–1.00)
GFR, Estimated: 17 mL/min — ABNORMAL LOW (ref >60)
Glucose, Bld: 342 mg/dL — ABNORMAL HIGH (ref 70–99)
Potassium: 3.9 mmol/L (ref 3.5–5.1)
Sodium: 153 mmol/L — ABNORMAL HIGH (ref 135–145)

## 2022-12-22 LAB — COMPREHENSIVE METABOLIC PANEL
ALT: 31 U/L (ref 0–44)
AST: 27 U/L (ref 15–41)
Albumin: 3.5 g/dL (ref 3.5–5.0)
Alkaline Phosphatase: 123 U/L (ref 38–126)
Anion gap: 16 — ABNORMAL HIGH (ref 5–15)
BUN: 76 mg/dL — ABNORMAL HIGH (ref 8–23)
CO2: 16 mmol/L — ABNORMAL LOW (ref 22–32)
Calcium: 10.4 mg/dL — ABNORMAL HIGH (ref 8.9–10.3)
Chloride: 115 mmol/L — ABNORMAL HIGH (ref 98–111)
Creatinine, Ser: 3.2 mg/dL — ABNORMAL HIGH (ref 0.44–1.00)
GFR, Estimated: 14 mL/min — ABNORMAL LOW (ref >60)
Glucose, Bld: 751 mg/dL (ref 70–99)
Potassium: 4.1 mmol/L (ref 3.5–5.1)
Sodium: 147 mmol/L — ABNORMAL HIGH (ref 135–145)
Total Bilirubin: 0.4 mg/dL (ref 0.3–1.2)
Total Protein: 6.9 g/dL (ref 6.5–8.1)

## 2022-12-22 LAB — GLUCOSE, CAPILLARY: Glucose-Capillary: 249 mg/dL — ABNORMAL HIGH (ref 70–99)

## 2022-12-22 LAB — MAGNESIUM: Magnesium: 2.5 mg/dL — ABNORMAL HIGH (ref 1.7–2.4)

## 2022-12-22 LAB — LACTIC ACID, PLASMA
Lactic Acid, Venous: 4 mmol/L (ref 0.5–1.9)
Lactic Acid, Venous: 4.4 mmol/L (ref 0.5–1.9)
Lactic Acid, Venous: 4 mmol/L (ref 0.5–1.9)

## 2022-12-22 LAB — PROCALCITONIN: Procalcitonin: 0.42 ng/mL

## 2022-12-22 MED ORDER — DEXTROSE IN LACTATED RINGERS 5 % IV SOLN: INTRAVENOUS | Status: DC

## 2022-12-22 MED ORDER — POTASSIUM CHLORIDE 10 MEQ/100ML IV SOLN
10.0000 meq | Freq: Once | INTRAVENOUS | Status: AC
  Administered 2022-12-22: 10 meq via INTRAVENOUS
  Filled 2022-12-22: qty 100

## 2022-12-22 MED ORDER — VANCOMYCIN VARIABLE DOSE PER UNSTABLE RENAL FUNCTION (PHARMACIST DOSING): Status: DC

## 2022-12-22 MED ORDER — LACTATED RINGERS IV SOLN: INTRAVENOUS | Status: DC

## 2022-12-22 MED ORDER — INSULIN REGULAR(HUMAN) IN NACL 100-0.9 UT/100ML-% IV SOLN
INTRAVENOUS | Status: DC
  Administered 2022-12-22: 5.5 [IU]/h via INTRAVENOUS
  Administered 2022-12-23 (×2): 0.6 [IU]/h via INTRAVENOUS
  Administered 2022-12-23: 0.8 [IU]/h via INTRAVENOUS
  Administered 2022-12-23: 0.7 [IU]/h via INTRAVENOUS
  Administered 2022-12-23: 2.4 [IU]/h via INTRAVENOUS
  Administered 2022-12-23: 0.4 [IU]/h via INTRAVENOUS
  Administered 2022-12-23: 0.7 [IU]/h via INTRAVENOUS
  Filled 2022-12-22: qty 100

## 2022-12-22 MED ORDER — POTASSIUM CHLORIDE 10 MEQ/100ML IV SOLN
10.0000 meq | INTRAVENOUS | Status: AC
  Administered 2022-12-22: 10 meq via INTRAVENOUS
  Filled 2022-12-22: qty 100

## 2022-12-22 MED ORDER — LACTATED RINGERS IV BOLUS
1000.0000 mL | Freq: Once | INTRAVENOUS | Status: AC
  Administered 2022-12-22: 1000 mL via INTRAVENOUS

## 2022-12-22 MED ORDER — VANCOMYCIN HCL 1250 MG/250ML IV SOLN
1250.0000 mg | Freq: Once | INTRAVENOUS | Status: DC
  Filled 2022-12-22: qty 250

## 2022-12-22 MED ORDER — ENOXAPARIN SODIUM 30 MG/0.3ML IJ SOSY
30.0000 mg | PREFILLED_SYRINGE | INTRAMUSCULAR | Status: DC
  Administered 2022-12-23 – 2022-12-27 (×5): 30 mg via SUBCUTANEOUS
  Filled 2022-12-22 (×5): qty 0.3

## 2022-12-22 MED ORDER — VANCOMYCIN HCL IN DEXTROSE 1-5 GM/200ML-% IV SOLN: 1000.0000 mg | Freq: Once | INTRAVENOUS | Status: DC

## 2022-12-22 MED ORDER — SODIUM CHLORIDE 0.9 % IV SOLN: 2.0000 g | INTRAVENOUS | Status: DC

## 2022-12-22 MED ORDER — SODIUM CHLORIDE 0.9 % IV SOLN: 2.0000 g | Freq: Once | INTRAVENOUS | Status: DC

## 2022-12-22 MED ORDER — LACTATED RINGERS IV BOLUS
1000.0000 mL | INTRAVENOUS | Status: AC
  Administered 2022-12-22: 1000 mL via INTRAVENOUS

## 2022-12-22 MED ORDER — DEXTROSE 50 % IV SOLN: 0.0000 mL | INTRAVENOUS | Status: DC | PRN

## 2022-12-22 NOTE — ED Notes (Signed)
ED TO INPATIENT HANDOFF REPORT  ED Nurse Name and Phone #: [phone]  S Name/Age/Gender [name] 77 y.o. female Room/Bed: 020C/020C  Code Status   Code Status: Full Code  Home/SNF/Other Skilled nursing facility Patient oriented to: self Is this baseline? Yes   Triage Complete: Triage complete  Chief Complaint Hyperosmolar hyperglycemic state (HHS) (HCC) [E11.00]  Triage Note PT BIB [facility] from [facility]. EMS reported patient is seen by doctor at [facility] blood work was ordered for the patient. Pt blood work showed her sugar was over 1,000. Facility reported to EMS patient started acting Lethargic. Per EMS patient received Novolog 15 units, Largene 10 units and Normal Saline 1000 fluid bolus by the facility, which was ordered by the MD at [facility]. It was reported patient is confuse at baseline and EMS reported she can follow simple command.   Allergies Allergies  Allergen Reactions   No Known Allergies     Level of Care/Admitting Diagnosis ED Disposition     ED Disposition  Admit   Condition  --   Comment  Hospital [facility] [[id number]]  Level of Care: Progressive [102]  Admit to Progressive based on following criteria: GI, ENDOCRINE disease patients with GI bleeding, acute liver failure or pancreatitis, stable with diabetic ketoacidosis or thyrotoxicosis (hypothyroid) state.  Admit to Progressive based on following criteria: MULTISYSTEM THREATS such as stable sepsis, metabolic/electrolyte imbalance with or without encephalopathy that is responding to early treatment.  Admit to Progressive based on following criteria: NEUROLOGICAL AND NEUROSURGICAL complex patients with significant risk of instability, who do not meet ICU criteria, yet require close observation or frequent assessment (< / = every 2 - 4 hours) with medical / nursing intervention.  May admit patient to [facility] or [facility] if  equivalent level of care is available:: No  Covid Evaluation: Asymptomatic - no recent exposure (last 10 days) testing not required  Diagnosis: Hyperosmolar hyperglycemic state (HHS) [facility]) [[id number]]  Admitting Physician: Hillary Bow [phone]  Attending Physician: Hillary Bow [phone]  Certification:: I certify this patient is being admitted for an inpatient-only procedure  Estimated Length of Stay: 5          B Medical/Surgery History Past Medical History:  Diagnosis Date   Anxiety    Cervical spondylosis with myelopathy    Complication of anesthesia    hard time waking up with last procedure years ago   COPD (chronic obstructive pulmonary disease) (HCC)    Depression    Diabetes mellitus without complication (HCC)    Diabetic neuropathy (HCC)    Dyspnea    Headache    migraines   Hypertension    Memory deficit    Pancreatitis    Schizophrenia (HCC)    Stroke (HCC)    no deficits   Wears dentures    Wears glasses    Past Surgical History:  Procedure Laterality Date   ANTERIOR CERVICAL DECOMPRESSION/DISCECTOMY FUSION 4 LEVELS N/A 04/01/2016   Procedure: ANTERIOR CERVICAL DECOMPRESSION/DISCECTOMY FUSION CERVICAL THREE- CERVICAL FOUR, CERVICAL FOUR-CERVICAL FIVE, CORPECTOMY C6;  Surgeon: Lisbeth Renshaw, MD;  Location: [facility];  Service: Neurosurgery;  Laterality: N/A;  ANTERIOR CERVICAL DECOMPRESSION/DISCECTOMY FUSION C3-C4, C4-C5,C5-C6,C6-C7   FOOT SURGERY Right    MULTIPLE TOOTH EXTRACTIONS     POSTERIOR CERVICAL FUSION/FORAMINOTOMY N/A 06/09/2016   Procedure: Cervical three-four, four-five, five-six, six-seven posterior cervical fusion;  Surgeon: Lisbeth Renshaw, MD;  Location: [facility] OR;  Service:  Neurosurgery;  Laterality: N/A;   RHINOPLASTY     ROTATOR CUFF REPAIR     TONSILLECTOMY     TUBAL LIGATION       A IV Location/Drains/Wounds Patient Lines/Drains/Airways Status     Active Line/Drains/Airways     Name Placement date Placement time Site Days    Peripheral IV 12/22/22 Posterior;Right Hand 12/22/22  1843  Hand  less than 1   External Urinary Catheter 09/05/21  0940  --  473   Incision (Closed) 06/09/16 Neck Other (Comment) 06/09/16  1856  -- 2387            Intake/Output Last 24 hours No intake or output data in the 24 hours ending 12/22/22 2020  Labs/Imaging Results for orders placed or performed during the hospital encounter of 12/22/22 (from the past 48 hour(s))  Beta-hydroxybutyric acid     Status: Abnormal   Collection Time: 12/22/22  5:21 PM  Result Value Ref Range   Beta-Hydroxybutyric Acid 0.34 (H) 0.05 - 0.27 mmol/L    Comment: Performed at [facility], [address], [place], [place] [address]  Lactic acid, plasma     Status: Abnormal   Collection Time: 12/22/22  5:37 PM  Result Value Ref Range   Lactic Acid, Venous 4.0 (HH) 0.5 - 1.9 mmol/L    Comment: CRITICAL RESULT CALLED TO, READ BACK BY AND VERIFIED WITH Lovie Macadamia, RN @ 1900 12/22/22 BY [facility] Performed at [facility], [address], [place], [place] [address]   CBG monitoring, ED     Status: Abnormal   Collection Time: 12/22/22  5:52 PM  Result Value Ref Range   Glucose-Capillary 576 (HH) 70 - 99 mg/dL    Comment: Glucose reference range applies only to samples taken after fasting for at least 8 hours.  CBC with Differential     Status: Abnormal   Collection Time: 12/22/22  5:55 PM  Result Value Ref Range   WBC 14.2 (H) 4.0 - 10.5 K/uL   RBC 4.48 3.87 - 5.11 MIL/uL   Hemoglobin 12.5 12.0 - 15.0 g/dL   HCT 09.8 11.9 - 14.7 %   MCV 93.5 80.0 - 100.0 fL   MCH 27.9 26.0 - 34.0 pg   MCHC 29.8 (L) 30.0 - 36.0 g/dL   RDW 82.9 56.2 - 13.0 %   Platelets 172 150 - 400 K/uL   nRBC 0.0 0.0 - 0.2 %   Neutrophils Relative % 77 %   Neutro Abs 11.0 (H) 1.7 - 7.7 K/uL   Lymphocytes Relative 15 %   Lymphs Abs 2.1 0.7 - 4.0 K/uL   Monocytes Relative 7 %   Monocytes Absolute 1.0 0.1 - 1.0 K/uL   Eosinophils Relative 0 %   Eosinophils  Absolute 0.0 0.0 - 0.5 K/uL   Basophils Relative 0 %   Basophils Absolute 0.1 0.0 - 0.1 K/uL   Immature Granulocytes 1 %   Abs Immature Granulocytes 0.07 0.00 - 0.07 K/uL    Comment: Performed at [facility], [address], [place], [place] [address]  Comprehensive metabolic panel     Status: Abnormal   Collection Time: 12/22/22  5:55 PM  Result Value Ref Range   Sodium 147 (H) 135 - 145 mmol/L   Potassium 4.1 3.5 - 5.1 mmol/L   Chloride 115 (H) 98 - 111 mmol/L   CO2 16 (L) 22 - 32 mmol/L   Glucose, Bld 751 (HH) 70 - 99 mg/dL  Comment: CRITICAL RESULT CALLED TO, READ BACK BY AND VERIFIED WITH Anaily Ashbaugh, RN @ [phone] 12/22/22 BY [facility] Glucose reference range applies only to samples taken after fasting for at least 8 hours.    BUN 76 (H) 8 - 23 mg/dL   Creatinine, Ser 9.60 (H) 0.44 - 1.00 mg/dL   Calcium 45.4 (H) 8.9 - 10.3 mg/dL   Total Protein 6.9 6.5 - 8.1 g/dL   Albumin 3.5 3.5 - 5.0 g/dL   AST 27 15 - 41 U/L   ALT 31 0 - 44 U/L   Alkaline Phosphatase 123 38 - 126 U/L   Total Bilirubin 0.4 0.3 - 1.2 mg/dL   GFR, Estimated 14 (L) >60 mL/min    Comment: (NOTE) Calculated using the CKD-EPI Creatinine Equation (2021)    Anion gap 16 (H) 5 - 15    Comment: Performed at [facility], [address], [place], [place] [address]  Magnesium     Status: Abnormal   Collection Time: 12/22/22  5:55 PM  Result Value Ref Range   Magnesium 2.5 (H) 1.7 - 2.4 mg/dL    Comment: Performed at [facility], [address], [place], [place] [address]  I-Stat venous blood gas, [facility], [facility], [facility])     Status: Abnormal   Collection Time: 12/22/22  6:44 PM  Result Value Ref Range   pH, Ven 7.328 7.25 - 7.43   pCO2, Ven 37.0 (L) 44 - 60 mmHg   pO2, Ven 64 (H) 32 - 45 mmHg   Bicarbonate 19.4 (L) 20.0 - 28.0 mmol/L   TCO2 21 (L) 22 - 32 mmol/L   O2 Saturation 90 %   Acid-base deficit 6.0 (H) 0.0 - 2.0 mmol/L   Sodium 152 (H) 135 - 145 mmol/L   Potassium 4.0 3.5 - 5.1  mmol/L   Calcium, Ion 1.36 1.15 - 1.40 mmol/L   HCT 39.0 36.0 - 46.0 %   Hemoglobin 13.3 12.0 - 15.0 g/dL   Sample type VENOUS    DG CHEST PORT 1 VIEW  Result Date: 12/22/2022 CLINICAL DATA:  Hyperglycemia. EXAM: PORTABLE CHEST 1 VIEW COMPARISON:  September 18, 2017 FINDINGS: The heart size and mediastinal contours are within normal limits. There is marked severity calcification of the thoracic aorta. Both lungs are clear. Multiple chronic right-sided rib fractures are seen. Stable postoperative changes are noted within the cervical spine. IMPRESSION: No active disease. Electronically Signed   By: Aram Candela M.D.   On: 12/22/2022 18:29   CT Head Wo Contrast  Result Date: 12/22/2022 CLINICAL DATA:  Confusion EXAM: CT HEAD WITHOUT CONTRAST TECHNIQUE: Contiguous axial images were obtained from the base of the skull through the vertex without intravenous contrast. RADIATION DOSE REDUCTION: This exam was performed according to the departmental dose-optimization program which includes automated exposure control, adjustment of the mA and/or kV according to patient size and/or use of iterative reconstruction technique. COMPARISON:  Head CT 09/05/2021 FINDINGS: Brain: No acute territorial infarction, hemorrhage or intracranial mass. Atrophy and chronic small vessel ischemic changes of the white matter. Small chronic right ganglial capsular infarct with minimal calcification. New focal hypodensity within the right posterior frontal white matter near the vertex, series 8 image 11 through 13. Ventricles are slightly. Vascular: No hyperdense vessels. Vertebral and carotid vascular calcifications. Calcifications in the region of the right MCA as before. Skull: No fracture Sinuses/Orbits: No acute finding. Other: None IMPRESSION: 1. New focal hypodensity within the right posterior frontal white matter near the  vertex, indeterminate for acute to subacute infarct or small vessel disease. Consider correlation with  MRI. 2. Atrophy and chronic small vessel ischemic changes of the white matter. Slight interval ventricular enlargement, probably related to atrophy progression. Electronically Signed   By: Jasmine Pang M.D.   On: 12/22/2022 18:17    Pending Labs Unresulted Labs (From admission, onward)     Start     Ordered   12/22/22 1943  Basic metabolic panel  (Hyperglycemic Hyperosmolar State (HHS))  STAT Now then every 4 hours ,   R (with STAT occurrences)      12/22/22 1944   12/22/22 1943  Hemoglobin A1c  (Hyperglycemic Hyperosmolar State (HHS))  Once,   R       Comments: To assess prior glycemic control.    12/22/22 1944   12/22/22 1911  Osmolality  Add-on,   AD        12/22/22 1910   12/22/22 1908  Blood culture (routine x 2)  BLOOD CULTURE X 2,   R (with STAT occurrences)      12/22/22 1908   12/22/22 1721  Lactic acid, plasma  Now then every 2 hours,   R (with STAT occurrences)      12/22/22 1720   12/22/22 1721  Resp panel by RT-PCR (RSV, Flu A&B, Covid) Anterior Nasal Swab  Once,   URGENT        12/22/22 1720   12/22/22 1720  Urinalysis, Routine w reflex microscopic -Urine, Catheterized  Once,   URGENT       Question:  Specimen Source  Answer:  Urine, Catheterized   12/22/22 1720   12/22/22 1720  Urine Culture  Once,   URGENT       Question:  Indication  Answer:  Altered mental status (if no other cause identified)   12/22/22 1720            Vitals/Pain Today's Vitals   12/22/22 1815 12/22/22 1844 12/22/22 1900 12/22/22 1951  BP: (!) 121/91     Pulse: 78     Resp: (!) 21     Temp:   99 F (37.2 C)   TempSrc:   Rectal   SpO2: 99%     Height:    5\' 3"  (1.6 m)  PainSc:  0-No pain      Isolation Precautions No active isolations  Medications Medications  insulin regular, human (MYXREDLIN) 100 units/ 100 mL infusion (has no administration in time range)  lactated ringers infusion (has no administration in time range)  dextrose 5 % in lactated ringers infusion (has no  administration in time range)  dextrose 50 % solution 0-50 mL (has no administration in time range)  potassium chloride 10 mEq in 100 mL IVPB (has no administration in time range)  enoxaparin (LOVENOX) injection 30 mg (has no administration in time range)  lactated ringers bolus 1,000 mL (has no administration in time range)  lactated ringers bolus 1,000 mL (1,000 mLs Intravenous New Bag/Given 12/22/22 1845)    Mobility non-ambulatory     Focused Assessments Hyperglycemia   R Recommendations: See Admitting Provider Note  Report given to:   Additional Notes: Patient  blood sugar capillary is in the 500. She had a serum glucose it was 750. She also had a lactic 4.0. she is getting a LR bolus and she is going be on LR 125 continuous. She is going be on a insulin drip for her hyperglycemia. Pt is bedbound and does not walk. She has  a MRI order. They going bladder scan her to see how much is in her bladder, to determine if they need to put a foley in or in and out cath her.

## 2022-12-22 NOTE — H&P (Signed)
History and Physical    Patient: [name] ZOX:[id number] DOB: 05-20-1946 DOA: 12/22/2022 DOS: the patient was seen and examined on 12/22/2022 PCP: Estevan Oaks, NP  Patient coming from: Home  Chief Complaint:  Chief Complaint  Patient presents with   Hyperglycemia   HPI: [name] is a 77 y.o. female with medical history significant of DM2, depression, schizophrenia, prior CVA.  Pt with AMS.  Per EMS report, patient had lab work today notable for blood sugar over 1000. At her facility she was given normal saline, 15 units NovoLog, 10 units glargine and sent here for further evaluation. Patient also reported to be more lethargic or confused compared to normal. On arrival here, patient denies any complaints. She is confused, she knows her name but is disoriented to time and situation. Denies any pain including headache, chest pain, abdominal pain. No shortness of breath or cough.   Review of Systems: As mentioned in the history of present illness. All other systems reviewed and are negative. Past Medical History:  Diagnosis Date   Anxiety    Cervical spondylosis with myelopathy    Complication of anesthesia    hard time waking up with last procedure years ago   COPD (chronic obstructive pulmonary disease) (HCC)    Depression    Diabetes mellitus without complication (HCC)    Diabetic neuropathy (HCC)    Dyspnea    Headache    migraines   Hypertension    Memory deficit    Pancreatitis    Schizophrenia (HCC)    Stroke (HCC)    no deficits   Wears dentures    Wears glasses    Past Surgical History:  Procedure Laterality Date   ANTERIOR CERVICAL DECOMPRESSION/DISCECTOMY FUSION 4 LEVELS N/A 04/01/2016   Procedure: ANTERIOR CERVICAL DECOMPRESSION/DISCECTOMY FUSION CERVICAL THREE- CERVICAL FOUR, CERVICAL FOUR-CERVICAL FIVE, CORPECTOMY C6;  Surgeon: Lisbeth Renshaw, MD;  Location: [facility];  Service: Neurosurgery;  Laterality: N/A;  ANTERIOR CERVICAL  DECOMPRESSION/DISCECTOMY FUSION C3-C4, C4-C5,C5-C6,C6-C7   FOOT SURGERY Right    MULTIPLE TOOTH EXTRACTIONS     POSTERIOR CERVICAL FUSION/FORAMINOTOMY N/A 06/09/2016   Procedure: Cervical three-four, four-five, five-six, six-seven posterior cervical fusion;  Surgeon: Lisbeth Renshaw, MD;  Location: [facility];  Service: Neurosurgery;  Laterality: N/A;   RHINOPLASTY     ROTATOR CUFF REPAIR     TONSILLECTOMY     TUBAL LIGATION     Social History:  reports that she has quit smoking. Her smoking use included cigarettes. She has never used smokeless tobacco. She reports that she does not drink alcohol and does not use drugs.  Allergies  Allergen Reactions   No Known Allergies     No family history on file.  Prior to Admission medications   Medication Sig Start Date End Date Taking? Authorizing Provider  acetaminophen (TYLENOL) 325 MG tablet Take 650 mg by mouth in the morning and at bedtime.    [provider]  amLODipine-benazepril (LOTREL) 5-10 MG capsule Take 1 capsule by mouth daily.    [provider]  aspirin EC 81 MG tablet Take 81 mg by mouth daily.    [provider]  cyanocobalamin 1000 MCG tablet Take 1,000 mcg by mouth daily.    [provider]  donepezil (ARICEPT) 10 MG tablet Take 10 mg by mouth at bedtime.    [provider]  DULoxetine (CYMBALTA) 60 MG capsule Take 60 mg by mouth daily.    [provider]  Frederica Kuster Frederica Kuster) LIQD Take  237 mLs by mouth 2 (two) times daily between meals.    [provider]  Menthol, Topical Analgesic, 4 % GEL Apply 1 application topically daily. Apply to right shoulder for pain    [provider]  metFORMIN (GLUCOPHAGE-XR) 500 MG 24 hr tablet Take 750 mg by mouth in the morning and at bedtime.    [provider]  mirtazapine (REMERON) 15 MG tablet Take 15 mg by mouth at bedtime.    [provider]  rosuvastatin (CRESTOR) 20 MG tablet Take 20 mg by mouth at  bedtime.    [provider]  traZODone (DESYREL) 150 MG tablet Take 150 mg by mouth at bedtime.    [provider]  ziprasidone (GEODON) 80 MG capsule Take 80 mg by mouth at bedtime.     [provider]    Physical Exam: Vitals:   12/22/22 1900 12/22/22 1951 12/22/22 2000 12/22/22 2048  BP:   112/82   Pulse:   79   Resp:   19   Temp: 99 F (37.2 C)     TempSrc: Rectal     SpO2:   97%   Weight:    46.3 kg  Height:  5\' 3"  (1.6 m)  5\' 3"  (1.6 m)   Constitutional: NAD Respiratory: clear to auscultation bilaterally, no wheezing, no crackles. Normal respiratory effort. No accessory muscle use.  Cardiovascular: Regular rate and rhythm, no murmurs / rubs / gallops. No extremity edema. 2+ pedal pulses. No carotid bruits.  Abdomen: Non tender, no guarding Skin: No rashes, no foot ulcer Neurologic: MAE Psychiatric: confused, sleepy but wakes up to voice, answers simple yes/no questions.  Oriented to person.  Data Reviewed:     Labs on Admission: I have personally reviewed following labs and imaging studies  CBC: Recent Labs  Lab 12/22/22 1755 12/22/22 1844  WBC 14.2*  --   NEUTROABS 11.0*  --   HGB 12.5 13.3  HCT 41.9 39.0  MCV 93.5  --   PLT 172  --    Basic Metabolic Panel: Recent Labs  Lab 12/22/22 1755 12/22/22 1844  NA 147* 152*  K 4.1 4.0  CL 115*  --   CO2 16*  --   GLUCOSE 751*  --   BUN 76*  --   CREATININE 3.20*  --   CALCIUM 10.4*  --   MG 2.5*  --    GFR: Estimated Creatinine Clearance: 10.9 mL/min (A) (by C-G formula based on SCr of 3.2 mg/dL (H)). Liver Function Tests: Recent Labs  Lab 12/22/22 1755  AST 27  ALT 31  ALKPHOS 123  BILITOT 0.4  PROT 6.9  ALBUMIN 3.5    CBG: Recent Labs  Lab 12/22/22 1752 12/22/22 2030 12/22/22 2123 12/22/22 2155  GLUCAP 576* 475* 435* 388*   Urine analysis:    Component Value Date/Time   COLORURINE YELLOW 01/25/2017 2250   APPEARANCEUR HAZY (A) 01/25/2017 2250   LABSPEC  1.020 01/25/2017 2250   PHURINE 7.0 01/25/2017 2250   GLUCOSEU 50 (A) 01/25/2017 2250   HGBUR NEGATIVE 01/25/2017 2250   BILIRUBINUR NEGATIVE 01/25/2017 2250   KETONESUR NEGATIVE 01/25/2017 2250   PROTEINUR 100 (A) 01/25/2017 2250   NITRITE NEGATIVE 01/25/2017 2250   LEUKOCYTESUR SMALL (A) 01/25/2017 2250    Radiological Exams on Admission: DG CHEST PORT 1 VIEW  Result Date: 12/22/2022 CLINICAL DATA:  Hyperglycemia. EXAM: PORTABLE CHEST 1 VIEW COMPARISON:  September 18, 2017 FINDINGS: The heart size and mediastinal contours are within  normal limits. There is marked severity calcification of the thoracic aorta. Both lungs are clear. Multiple chronic right-sided rib fractures are seen. Stable postoperative changes are noted within the cervical spine. IMPRESSION: No active disease. Electronically Signed   By: Aram Candela M.D.   On: 12/22/2022 18:29   CT Head Wo Contrast  Result Date: 12/22/2022 CLINICAL DATA:  Confusion EXAM: CT HEAD WITHOUT CONTRAST TECHNIQUE: Contiguous axial images were obtained from the base of the skull through the vertex without intravenous contrast. RADIATION DOSE REDUCTION: This exam was performed according to the departmental dose-optimization program which includes automated exposure control, adjustment of the mA and/or kV according to patient size and/or use of iterative reconstruction technique. COMPARISON:  Head CT 09/05/2021 FINDINGS: Brain: No acute territorial infarction, hemorrhage or intracranial mass. Atrophy and chronic small vessel ischemic changes of the white matter. Small chronic right ganglial capsular infarct with minimal calcification. New focal hypodensity within the right posterior frontal white matter near the vertex, series 8 image 11 through 13. Ventricles are slightly. Vascular: No hyperdense vessels. Vertebral and carotid vascular calcifications. Calcifications in the region of the right MCA as before. Skull: No fracture Sinuses/Orbits: No acute  finding. Other: None IMPRESSION: 1. New focal hypodensity within the right posterior frontal white matter near the vertex, indeterminate for acute to subacute infarct or small vessel disease. Consider correlation with MRI. 2. Atrophy and chronic small vessel ischemic changes of the white matter. Slight interval ventricular enlargement, probably related to atrophy progression. Electronically Signed   By: Jasmine Pang M.D.   On: 12/22/2022 18:17      Assessment and Plan: * Hyperosmolar hyperglycemic state (HHS) (HCC) Calculated Serum osm = 363. Corrected sodium = 163. HHS pathway Insulin gtt IVF = 2L LR bolus here in ED followed by fluids per pathway BMP Q4H Serum osm pending VBG confirms not acidotic. Tele monitor  Acute kidney failure (HCC) AKF is almost certainly pre-renal / ATN in setting of HHS. IVF Strict intake and output Bladder scan = 26ml Get renal US and nephro consult in AM if no UOP overnight  Acute metabolic encephalopathy Due to HHS, but worried about superimposed acute stroke vs cerebral edema (see CT scan results), in setting of HHS.  Stroke seems more likely than edema given the focal finding on CT at this point. Ordered MRI brain Let neurology know as well. MRI pending Dr. Derry Lory agrees with neuro read, thinking maybe more small vessel infarct as doesn't appear to be vascular territory. Permissive HTN for the moment. Management of CBG as per endotool.      Advance Care Planning:   Code Status: DNR Yellow form at bedside, confirmed with family  Consults: None officially (d/w Dr. Derry Lory as above).  Family Communication: Family at bedside  Severity of Illness: The appropriate patient status for this patient is INPATIENT. Inpatient status is judged to be reasonable and necessary in order to provide the required intensity of service to ensure the patient's safety. The patient's presenting symptoms, physical exam findings, and initial radiographic and  laboratory data in the context of their chronic comorbidities is felt to place them at high risk for further clinical deterioration. Furthermore, it is not anticipated that the patient will be medically stable for discharge from the hospital within 2 midnights of admission.   * I certify that at the point of admission it is my clinical judgment that the patient will require inpatient hospital care spanning beyond 2 midnights from the point of admission due to high  intensity of service, high risk for further deterioration and high frequency of surveillance required.*  Author: Hillary Bow., DO 12/22/2022 10:10 PM  For on call review [email or web address].

## 2022-12-22 NOTE — ED Triage Notes (Signed)
PT BIB [facility] from [facility]. EMS reported patient is seen by doctor at [facility] blood work was ordered for the patient. Pt blood work showed her sugar was over 1,000. Facility reported to EMS patient started acting Lethargic. Per EMS patient received Novolog 15 units, Largene 10 units and Normal Saline 1000 fluid bolus by the facility, which was ordered by the MD at [facility]. It was reported patient is confuse at baseline and EMS reported she can follow simple command.

## 2022-12-22 NOTE — ED Provider Notes (Signed)
[place] EMERGENCY DEPARTMENT AT [facility] Provider Note   CSN: [id number] Arrival date & time: 12/22/22  1701     History  Chief Complaint  Patient presents with   Hyperglycemia    [name] is a 77 y.o. female.  77 year old female history of COPD, depression, diabetes, hypertension, schizophrenia, prior CVA presenting for hyperglycemia.  Per EMS report, patient had lab work today notable for blood sugar over 1000.  At her facility she was given normal saline, 15 units NovoLog, 10 units glargine and sent here for further evaluation.  Patient also reported to be more lethargic or confused compared to normal.  On arrival here, patient denies any complaints.  She is confused, she knows her name but is disoriented to time and situation.  Denies any pain including headache, chest pain, abdominal pain.  No shortness of breath or cough.    Hyperglycemia      Home Medications Prior to Admission medications   Medication Sig Start Date End Date Taking? Authorizing Provider  acetaminophen (TYLENOL) 325 MG tablet Take 650 mg by mouth in the morning and at bedtime.    [provider]  amLODipine-benazepril (LOTREL) 5-10 MG capsule Take 1 capsule by mouth daily.    [provider]  aspirin EC 81 MG tablet Take 81 mg by mouth daily.    [provider]  cyanocobalamin 1000 MCG tablet Take 1,000 mcg by mouth daily.    [provider]  donepezil (ARICEPT) 10 MG tablet Take 10 mg by mouth at bedtime.    [provider]  DULoxetine (CYMBALTA) 60 MG capsule Take 60 mg by mouth daily.    [provider]  GLUCERNA (GLUCERNA) LIQD Take 237 mLs by mouth 2 (two) times daily between meals.    [provider]  Menthol, Topical Analgesic, 4 % GEL Apply 1 application topically daily. Apply to right shoulder for pain    [provider]  metFORMIN (GLUCOPHAGE-XR) 500 MG 24 hr tablet Take 750 mg by mouth in the  morning and at bedtime.    [provider]  mirtazapine (REMERON) 15 MG tablet Take 15 mg by mouth at bedtime.    [provider]  rosuvastatin (CRESTOR) 20 MG tablet Take 20 mg by mouth at bedtime.    [provider]  traZODone (DESYREL) 150 MG tablet Take 150 mg by mouth at bedtime.    [provider]  ziprasidone (GEODON) 80 MG capsule Take 80 mg by mouth at bedtime.     [provider]      Allergies    No known allergies    Review of Systems   Review of Systems  Reason unable to perform ROS: Patient confused.    Physical Exam Updated Vital Signs BP (!) 121/91   Pulse 78   Temp 99 F (37.2 C) (Rectal)   Resp (!) 21   Ht 5\' 3"  (1.6 m)   SpO2 99%   BMI 21.97 kg/m  Physical Exam Vitals and nursing note reviewed.  Constitutional:      General: She is not in acute distress.    Appearance: She is well-developed.  HENT:     Head: Normocephalic and atraumatic.     Nose: Nose normal.     Mouth/Throat:     Mouth: Mucous membranes are moist.     Pharynx: Oropharynx is clear.  Eyes:     Extraocular Movements: Extraocular movements intact.     Conjunctiva/sclera: Conjunctivae normal.  Pupils: Pupils are equal, round, and reactive to light.  Cardiovascular:     Rate and Rhythm: Normal rate and regular rhythm.     Heart sounds: No murmur heard. Pulmonary:     Effort: Pulmonary effort is normal. No respiratory distress.     Breath sounds: Normal breath sounds.  Abdominal:     Palpations: Abdomen is soft.     Tenderness: There is no abdominal tenderness. There is no guarding or rebound.  Musculoskeletal:        General: No swelling.     Cervical back: Normal range of motion and neck supple. No tenderness.     Right lower leg: No edema.     Left lower leg: No edema.  Skin:    General: Skin is warm and dry.     Capillary Refill: Capillary refill takes less than 2 seconds.  Neurological:     General: No focal deficit present.      Mental Status: She is alert.     Comments: Patient is awake and alert.  She is oriented to person but disoriented to time, situation.  She is confused on questioning.  She has no cranial nerve deficits.  She is full strength in bilateral upper and lower extremities.  Psychiatric:        Mood and Affect: Mood normal.     ED Results / Procedures / Treatments   Labs (all labs ordered are listed, but only abnormal results are displayed) Labs Reviewed  CBC WITH DIFFERENTIAL/PLATELET - Abnormal; Notable for the following components:      Result Value   WBC 14.2 (*)    MCHC 29.8 (*)    Neutro Abs 11.0 (*)    All other components within normal limits  COMPREHENSIVE METABOLIC PANEL - Abnormal; Notable for the following components:   Sodium 147 (*)    Chloride 115 (*)    CO2 16 (*)    Glucose, Bld 751 (*)    BUN 76 (*)    Creatinine, Ser 3.20 (*)    Calcium 10.4 (*)    GFR, Estimated 14 (*)    Anion gap 16 (*)    All other components within normal limits  MAGNESIUM - Abnormal; Notable for the following components:   Magnesium 2.5 (*)    All other components within normal limits  LACTIC ACID, PLASMA - Abnormal; Notable for the following components:   Lactic Acid, Venous 4.0 (*)    All other components within normal limits  BETA-HYDROXYBUTYRIC ACID - Abnormal; Notable for the following components:   Beta-Hydroxybutyric Acid 0.34 (*)    All other components within normal limits  I-STAT VENOUS BLOOD GAS, ED - Abnormal; Notable for the following components:   pCO2, Ven 37.0 (*)    pO2, Ven 64 (*)    Bicarbonate 19.4 (*)    TCO2 21 (*)    Acid-base deficit 6.0 (*)    Sodium 152 (*)    All other components within normal limits  CBG MONITORING, ED - Abnormal; Notable for the following components:   Glucose-Capillary 576 (*)    All other components within normal limits  URINE CULTURE  RESP PANEL BY RT-PCR (RSV, FLU A&B, COVID)  RVPGX2  CULTURE, BLOOD (ROUTINE X 2)  CULTURE, BLOOD  (ROUTINE X 2)  URINALYSIS, ROUTINE W REFLEX MICROSCOPIC  LACTIC ACID, PLASMA  OSMOLALITY  BASIC METABOLIC PANEL  BASIC METABOLIC PANEL  BASIC METABOLIC PANEL  BASIC METABOLIC PANEL  HEMOGLOBIN A1C    EKG EKG  Interpretation  Date/Time:  Thursday Dec 22 2022 18:31:10 EDT Ventricular Rate:  77 PR Interval:  89 QRS Duration: 90 QT Interval:  646 QTC Calculation: 732 R Axis:   46 Text Interpretation: Sinus rhythm Short PR interval Borderline T wave abnormalities Prolonged QT interval Otherwise no significant change Confirmed by Elayne Snare ([id number]) on 12/22/2022 7:12:07 PM  Radiology DG CHEST PORT 1 VIEW  Result Date: 12/22/2022 CLINICAL DATA:  Hyperglycemia. EXAM: PORTABLE CHEST 1 VIEW COMPARISON:  September 18, 2017 FINDINGS: The heart size and mediastinal contours are within normal limits. There is marked severity calcification of the thoracic aorta. Both lungs are clear. Multiple chronic right-sided rib fractures are seen. Stable postoperative changes are noted within the cervical spine. IMPRESSION: No active disease. Electronically Signed   By: Aram Candela M.D.   On: 12/22/2022 18:29   CT Head Wo Contrast  Result Date: 12/22/2022 CLINICAL DATA:  Confusion EXAM: CT HEAD WITHOUT CONTRAST TECHNIQUE: Contiguous axial images were obtained from the base of the skull through the vertex without intravenous contrast. RADIATION DOSE REDUCTION: This exam was performed according to the departmental dose-optimization program which includes automated exposure control, adjustment of the mA and/or kV according to patient size and/or use of iterative reconstruction technique. COMPARISON:  Head CT 09/05/2021 FINDINGS: Brain: No acute territorial infarction, hemorrhage or intracranial mass. Atrophy and chronic small vessel ischemic changes of the white matter. Small chronic right ganglial capsular infarct with minimal calcification. New focal hypodensity within the right posterior frontal white  matter near the vertex, series 8 image 11 through 13. Ventricles are slightly. Vascular: No hyperdense vessels. Vertebral and carotid vascular calcifications. Calcifications in the region of the right MCA as before. Skull: No fracture Sinuses/Orbits: No acute finding. Other: None IMPRESSION: 1. New focal hypodensity within the right posterior frontal white matter near the vertex, indeterminate for acute to subacute infarct or small vessel disease. Consider correlation with MRI. 2. Atrophy and chronic small vessel ischemic changes of the white matter. Slight interval ventricular enlargement, probably related to atrophy progression. Electronically Signed   By: Jasmine Pang M.D.   On: 12/22/2022 18:17    Procedures Procedures    Medications Ordered in ED Medications  insulin regular, human (MYXREDLIN) 100 units/ 100 mL infusion (has no administration in time range)  lactated ringers infusion (has no administration in time range)  dextrose 5 % in lactated ringers infusion (has no administration in time range)  dextrose 50 % solution 0-50 mL (has no administration in time range)  potassium chloride 10 mEq in 100 mL IVPB (has no administration in time range)  enoxaparin (LOVENOX) injection 30 mg (has no administration in time range)  lactated ringers bolus 1,000 mL (has no administration in time range)  lactated ringers bolus 1,000 mL (1,000 mLs Intravenous New Bag/Given 12/22/22 1845)    ED Course/ Medical Decision Making/ A&P Clinical Course as of 12/22/22 2029  Thu Dec 22, 2022  1848 EKG normal sinus rhythm, short PR of 89, my calculation of QTc is 453 manually.  I do not see significant ST or T wave abnormalities to suggest ischemia. [JD]    Clinical Course User Index [JD] Fulton Reek, MD                             Medical Decision Making Amount and/or Complexity of Data Reviewed Labs: ordered. Radiology: ordered.  Risk Prescription drug management. Decision regarding  hospitalization.   77 year old female presenting  for hyperglycemia.  Labs today notable for leukocytosis of 14,000, no anemia, sodium 144, potassium 5.5 without hemolysis, bicarb of 20, anion gap of 17, glucose over 1000, BUN of 76, creatinine of 3.3.  She already received insulin and some fluids at facility.  On exam she is somewhat confused reportedly worse than baseline.  I am concerned for possible DKA, hyperglycemia, HHS, underlying infection.  Will repeat labs here given she is already received fluids, insulin and evaluate.  Will likely need admission given AKI.  Obtain CT head given reported confusion, no focal deficits on exam.  CT head reviewed, she has slight opacity which could be age-indeterminate stroke versus early cerebral edema.  Chest x-ray reviewed, no signs of acute pneumonia, pulmonary edema, pleural effusion.  Her lab work is concerning, she has no acidosis on blood gas but has significant anion gap metabolic acidosis with hyponatremia, AKI, azotemia and severe hyperglycemia on CMP.  Her beta hydroxybutyrate is normal, concerning for HHS.  She has mild leukocytosis and lactic acidosis of 4 as well.  Antibiotics were ordered given concern for possible sepsis.  Her family was updated at bedside, they states she is just slightly confused compared to normal.  MRI was ordered as well.  I discussed the patient with the hospitalist service and she was accepted for admission for further management.  Insulin drip was ordered for HHS.        Final Clinical Impression(s) / ED Diagnoses Final diagnoses:  Hyperosmolar hyperglycemic state (HHS) (HCC)  AKI (acute kidney injury) [facility])    Rx / DC Orders ED Discharge Orders     None         Fulton Reek, MD 12/22/22 2029    Elayne Snare K, DO 12/22/22 2302

## 2022-12-22 NOTE — Progress Notes (Signed)
Pharmacy Antibiotic Note  [name] is a 77 y.o. female admitted on 12/22/2022 with hyperglycemia - met sepsis criteria on admission.  Pharmacy has been consulted for vancomycin and cefepime dosing.  Scr up to 3.2 on admission - baseline appears to be between 1.4-1.5. Last weight 124 lb and height 5'4" back in 09/2021, estimated CrCl ~12 mL/min.  WBC 14.2, Lactate 4  Plan: Vancomycin 1250mg  IV x1 Further vancomycin doses to be determined by vanc levels or if renal function returns to BL Cefepime 2g IV q24 hours Monitor renal function, culture data, clinical improvement.     No data recorded.  Recent Labs  Lab 12/22/22 1737 12/22/22 1755  WBC  --  14.2*  CREATININE  --  3.20*  LATICACIDVEN 4.0*  --     CrCl cannot be calculated (Unknown ideal weight.).    Allergies  Allergen Reactions   No Known Allergies     Antimicrobials this admission: Vancomycin 5/23 >>  Cefepime 5/23 >>   Dose adjustments this admission:  Microbiology results: 5/23 BCx:  5/23 UCx:    Thank you for allowing pharmacy to be a part of this patient's care.  Rexford Maus, PharmD, BCPS 12/22/2022 7:21 PM

## 2022-12-22 NOTE — Assessment & Plan Note (Deleted)
Due to HHS, but worried about superimposed acute stroke vs cerebral edema (see CT scan results), in setting of HHS.  Stroke seems more likely than edema given the focal finding on CT at this point. Ordered MRI brain Let neurology know as well. MRI pending Dr. Derry Lory agrees with neuro read, thinking maybe more small vessel infarct as doesn't appear to be vascular territory. Permissive HTN for the moment. Management of CBG as per endotool.

## 2022-12-22 NOTE — Assessment & Plan Note (Addendum)
Hypernatremia.  Renal function with serum cr at 2,18 with K at 4,1 and serum bicarbonate at 21.  Na 148.  Plan to discontinue IV fluids and allow patient to have po.  Follow up renal function this pm and in am.  Check Mg.

## 2022-12-22 NOTE — Progress Notes (Addendum)
Elink monitoring for the code sepsis protocol.   Sepsis cancelled. No longer following.

## 2022-12-22 NOTE — Assessment & Plan Note (Addendum)
Patient had mild DKA on admission down now has resolved.  Diet has been advanced and patient has been transitioned to sq insulin.  Approximately having 1 unit per hr on insulin drip, will transition to long acting insulin 6 units bis and add correction insulin sliding scale with short acting insulin. Patient is naive to insulin therapy.   Plan to check BMP this pm at 17:00 and further adjust insulin therapy.  Target glucose inpatient 160 to 180 mg/dl.

## 2022-12-22 NOTE — ED Notes (Signed)
Lab called critical Lactic Acid, venous 4.0 and Serum glucose 751, and Jonathon MD was made aware.

## 2022-12-22 NOTE — ED Notes (Signed)
Report was send to floor and RN called 5 west to let the receiving nurse the report was in. The receiving nurse on unit was made aware the patient is on the Endotool. Per the nurse he is not progressive and was told to let the nurse that sending the patient to the unit to call back in 10 mins, because the floor need to make an assignment change. Marchelle Folks, RN was made aware to call the floor, before bringing the patient up.

## 2022-12-22 NOTE — ED Notes (Signed)
Charge RN consulted with this RN and rapid response RN for confirmation on patient condition prior to patient coming to floor. IP RN received report and patient transported to floor.

## 2022-12-23 ENCOUNTER — Other Ambulatory Visit (HOSPITAL_COMMUNITY): Payer: Self-pay

## 2022-12-23 ENCOUNTER — Inpatient Hospital Stay (HOSPITAL_COMMUNITY): Payer: Medicare (Managed Care)

## 2022-12-23 DIAGNOSIS — I10 Essential (primary) hypertension: Secondary | ICD-10-CM

## 2022-12-23 DIAGNOSIS — G9341 Metabolic encephalopathy: Secondary | ICD-10-CM | POA: Diagnosis not present

## 2022-12-23 DIAGNOSIS — N179 Acute kidney failure, unspecified: Secondary | ICD-10-CM | POA: Diagnosis not present

## 2022-12-23 DIAGNOSIS — F203 Undifferentiated schizophrenia: Secondary | ICD-10-CM | POA: Diagnosis not present

## 2022-12-23 DIAGNOSIS — E1100 Type 2 diabetes mellitus with hyperosmolarity without nonketotic hyperglycemic-hyperosmolar coma (NKHHC): Secondary | ICD-10-CM | POA: Diagnosis not present

## 2022-12-23 LAB — BASIC METABOLIC PANEL
Anion gap: 12 (ref 5–15)
Anion gap: 10 (ref 5–15)
Anion gap: 9 (ref 5–15)
Anion gap: 9 (ref 5–15)
BUN: 64 mg/dL — ABNORMAL HIGH (ref 8–23)
BUN: 62 mg/dL — ABNORMAL HIGH (ref 8–23)
BUN: 51 mg/dL — ABNORMAL HIGH (ref 8–23)
BUN: 47 mg/dL — ABNORMAL HIGH (ref 8–23)
CO2: 21 mmol/L — ABNORMAL LOW (ref 22–32)
CO2: 22 mmol/L (ref 22–32)
CO2: 21 mmol/L — ABNORMAL LOW (ref 22–32)
CO2: 20 mmol/L — ABNORMAL LOW (ref 22–32)
Calcium: 10.2 mg/dL (ref 8.9–10.3)
Calcium: 9.9 mg/dL (ref 8.9–10.3)
Calcium: 9.9 mg/dL (ref 8.9–10.3)
Calcium: 9.7 mg/dL (ref 8.9–10.3)
Chloride: 120 mmol/L — ABNORMAL HIGH (ref 98–111)
Chloride: 120 mmol/L — ABNORMAL HIGH (ref 98–111)
Chloride: 118 mmol/L — ABNORMAL HIGH (ref 98–111)
Chloride: 115 mmol/L — ABNORMAL HIGH (ref 98–111)
Creatinine, Ser: 2.69 mg/dL — ABNORMAL HIGH (ref 0.44–1.00)
Creatinine, Ser: 2.56 mg/dL — ABNORMAL HIGH (ref 0.44–1.00)
Creatinine, Ser: 2.18 mg/dL — ABNORMAL HIGH (ref 0.44–1.00)
Creatinine, Ser: 2.01 mg/dL — ABNORMAL HIGH (ref 0.44–1.00)
GFR, Estimated: 18 mL/min — ABNORMAL LOW (ref >60)
GFR, Estimated: 19 mL/min — ABNORMAL LOW (ref >60)
GFR, Estimated: 23 mL/min — ABNORMAL LOW (ref >60)
GFR, Estimated: 25 mL/min — ABNORMAL LOW (ref >60)
Glucose, Bld: 212 mg/dL — ABNORMAL HIGH (ref 70–99)
Glucose, Bld: 166 mg/dL — ABNORMAL HIGH (ref 70–99)
Glucose, Bld: 201 mg/dL — ABNORMAL HIGH (ref 70–99)
Glucose, Bld: 244 mg/dL — ABNORMAL HIGH (ref 70–99)
Potassium: 4 mmol/L (ref 3.5–5.1)
Potassium: 4 mmol/L (ref 3.5–5.1)
Potassium: 4.1 mmol/L (ref 3.5–5.1)
Potassium: 4.2 mmol/L (ref 3.5–5.1)
Sodium: 153 mmol/L — ABNORMAL HIGH (ref 135–145)
Sodium: 152 mmol/L — ABNORMAL HIGH (ref 135–145)
Sodium: 148 mmol/L — ABNORMAL HIGH (ref 135–145)
Sodium: 144 mmol/L (ref 135–145)

## 2022-12-23 LAB — RESP PANEL BY RT-PCR (RSV, FLU A&B, COVID)  RVPGX2
Influenza A by PCR: NEGATIVE
Influenza B by PCR: NEGATIVE
Resp Syncytial Virus by PCR: NEGATIVE
SARS Coronavirus 2 by RT PCR: NEGATIVE

## 2022-12-23 LAB — GLUCOSE, CAPILLARY
Glucose-Capillary: 117 mg/dL — ABNORMAL HIGH (ref 70–99)
Glucose-Capillary: 129 mg/dL — ABNORMAL HIGH (ref 70–99)
Glucose-Capillary: 129 mg/dL — ABNORMAL HIGH (ref 70–99)
Glucose-Capillary: 132 mg/dL — ABNORMAL HIGH (ref 70–99)
Glucose-Capillary: 133 mg/dL — ABNORMAL HIGH (ref 70–99)
Glucose-Capillary: 147 mg/dL — ABNORMAL HIGH (ref 70–99)
Glucose-Capillary: 147 mg/dL — ABNORMAL HIGH (ref 70–99)
Glucose-Capillary: 156 mg/dL — ABNORMAL HIGH (ref 70–99)
Glucose-Capillary: 162 mg/dL — ABNORMAL HIGH (ref 70–99)
Glucose-Capillary: 163 mg/dL — ABNORMAL HIGH (ref 70–99)
Glucose-Capillary: 180 mg/dL — ABNORMAL HIGH (ref 70–99)
Glucose-Capillary: 204 mg/dL — ABNORMAL HIGH (ref 70–99)
Glucose-Capillary: 213 mg/dL — ABNORMAL HIGH (ref 70–99)
Glucose-Capillary: 221 mg/dL — ABNORMAL HIGH (ref 70–99)

## 2022-12-23 LAB — MRSA NEXT GEN BY PCR, NASAL: MRSA by PCR Next Gen: NOT DETECTED

## 2022-12-23 LAB — LACTIC ACID, PLASMA: Lactic Acid, Venous: 2.7 mmol/L (ref 0.5–1.9)

## 2022-12-23 MED ORDER — DULOXETINE HCL 60 MG PO CPEP
60.0000 mg | ORAL_CAPSULE | Freq: Every day | ORAL | Status: DC
  Administered 2022-12-23 – 2022-12-27 (×5): 60 mg via ORAL
  Filled 2022-12-23 (×5): qty 1

## 2022-12-23 MED ORDER — INSULIN ASPART 100 UNIT/ML IJ SOLN
0.0000 [IU] | Freq: Three times a day (TID) | INTRAMUSCULAR | Status: DC
  Administered 2022-12-23: 3 [IU] via SUBCUTANEOUS
  Administered 2022-12-25: 2 [IU] via SUBCUTANEOUS
  Administered 2022-12-25: 5 [IU] via SUBCUTANEOUS
  Administered 2022-12-26 (×3): 2 [IU] via SUBCUTANEOUS
  Administered 2022-12-27: 1 [IU] via SUBCUTANEOUS
  Administered 2022-12-27: 2 [IU] via SUBCUTANEOUS

## 2022-12-23 MED ORDER — BENAZEPRIL HCL 5 MG PO TABS
10.0000 mg | ORAL_TABLET | Freq: Every day | ORAL | Status: DC
  Administered 2022-12-23 – 2022-12-27 (×5): 10 mg via ORAL
  Filled 2022-12-23 (×5): qty 2

## 2022-12-23 MED ORDER — DONEPEZIL HCL 10 MG PO TABS
10.0000 mg | ORAL_TABLET | Freq: Every day | ORAL | Status: DC
  Administered 2022-12-23 – 2022-12-26 (×4): 10 mg via ORAL
  Filled 2022-12-23 (×4): qty 1

## 2022-12-23 MED ORDER — TRAZODONE HCL 100 MG PO TABS
100.0000 mg | ORAL_TABLET | Freq: Every day | ORAL | Status: DC
  Administered 2022-12-23 – 2022-12-26 (×4): 100 mg via ORAL
  Filled 2022-12-23 (×4): qty 1

## 2022-12-23 MED ORDER — ROSUVASTATIN CALCIUM 20 MG PO TABS
20.0000 mg | ORAL_TABLET | Freq: Every day | ORAL | Status: DC
  Administered 2022-12-23 – 2022-12-26 (×4): 20 mg via ORAL
  Filled 2022-12-23 (×4): qty 1

## 2022-12-23 MED ORDER — MIRTAZAPINE 15 MG PO TABS
15.0000 mg | ORAL_TABLET | Freq: Every day | ORAL | Status: DC
  Administered 2022-12-23 – 2022-12-26 (×4): 15 mg via ORAL
  Filled 2022-12-23 (×4): qty 1

## 2022-12-23 MED ORDER — ZIPRASIDONE HCL 40 MG PO CAPS
60.0000 mg | ORAL_CAPSULE | Freq: Every day | ORAL | Status: DC
  Administered 2022-12-23 – 2022-12-26 (×4): 60 mg via ORAL
  Filled 2022-12-23 (×5): qty 1

## 2022-12-23 MED ORDER — ASPIRIN 81 MG PO TBEC
81.0000 mg | DELAYED_RELEASE_TABLET | Freq: Every day | ORAL | Status: DC
  Administered 2022-12-23 – 2022-12-27 (×5): 81 mg via ORAL
  Filled 2022-12-23 (×5): qty 1

## 2022-12-23 MED ORDER — INSULIN GLARGINE-YFGN 100 UNIT/ML ~~LOC~~ SOLN
6.0000 [IU] | Freq: Two times a day (BID) | SUBCUTANEOUS | Status: DC
  Administered 2022-12-23 – 2022-12-26 (×7): 6 [IU] via SUBCUTANEOUS
  Filled 2022-12-23 (×10): qty 0.06

## 2022-12-23 MED ORDER — AMLODIPINE BESYLATE 5 MG PO TABS
5.0000 mg | ORAL_TABLET | Freq: Every day | ORAL | Status: DC
  Administered 2022-12-23 – 2022-12-27 (×5): 5 mg via ORAL
  Filled 2022-12-23 (×5): qty 1

## 2022-12-23 MED ORDER — POTASSIUM CL IN DEXTROSE 5% 20 MEQ/L IV SOLN
20.0000 meq | INTRAVENOUS | Status: DC
  Administered 2022-12-23: 20 meq via INTRAVENOUS
  Filled 2022-12-23 (×2): qty 1000

## 2022-12-23 MED ORDER — SODIUM CHLORIDE 0.45 % IV SOLN: INTRAVENOUS | Status: DC

## 2022-12-23 NOTE — TOC Initial Note (Addendum)
Transition of Care [facility]) - Initial/Assessment Note    Patient Details  Name: [name] MRN: [id number] Date of Birth: 01/07/1946  [facility] CM/SW Contact:    Leone Haven, RN Phone Number: 12/23/2022, 4:47 PM  Clinical Narrative:                 Patient is from [facility], [facility] Patient, Kelle Darting is the CSW.  [facility] will be closed on Monday. CSW aware of patient from [facility]. Marylu Lund states she spoke with CSW, please call [facility] at  [phone] on the weekend for needs , they will transport back to SNF.  Expected Discharge Plan: Skilled Nursing Facility Barriers to Discharge: Continued Medical Work up   Patient Goals and CMS Choice Patient states their goals for this hospitalization and ongoing recovery are:: return back to [employer]          Expected Discharge Plan and Services In-house Referral: Clinical Social Work     Living arrangements for the past 2 months: Skilled Nursing Facility                                      Prior Living Arrangements/Services Living arrangements for the past 2 months: Skilled Nursing Facility Lives with:: Facility Resident                   Activities of Daily Living      Permission Sought/Granted                  Emotional Assessment              Admission diagnosis:  AKI (acute kidney injury) (HCC) [N17.9] Hyperosmolar hyperglycemic state (HHS) (HCC) [E11.00] Patient Active Problem List   Diagnosis Date Noted   Essential hypertension 12/23/2022   Hyperosmolar hyperglycemic state (HHS) (HCC) 12/22/2022   Acute metabolic encephalopathy 12/22/2022   Acute kidney failure (HCC) 12/22/2022   Other secondary kyphosis, cervical region 06/09/2016   Schizophrenia (HCC) 04/29/2016   Type 2 diabetes mellitus (HCC) 04/29/2016   Cervical spondylosis with myelopathy 04/01/2016   Myelopathy (HCC) 02/19/2016   Cognitive impairment 02/19/2016   History of  alcohol abuse 02/19/2016   PCP:  Estevan Oaks, NP Pharmacy:   [facility] [phone] [place], [place] - [address] AT [facility] [address] & [facility] [address] [place] [address] Phone: [phone] Fax: [phone]  [facility] - [place], [place] - [address] [place] [address] Phone: [phone] Fax: [phone]  [facility] [address] [place] - [address] [place] [address] Phone: [phone] Fax: [phone]  [id number] - [place], [place] - [address] AT [facility] OF [address] [facility] [address] [place] [address] Phone: [phone] Fax: [phone]  [facility] - [place], [place] - [address] [place] [address] Phone: [phone] Fax: [phone]     Social Determinants of Health (SDOH) Social History: SDOH Screenings   Tobacco Use: Medium Risk (10/08/2021)   SDOH Interventions:     Readmission Risk Interventions     No data to display

## 2022-12-23 NOTE — Inpatient Diabetes Management (Addendum)
Inpatient Diabetes Program Recommendations  AACE/ADA: New Consensus Statement on Inpatient Glycemic Control (2015)  Target Ranges:  Prepandial:   less than 140 mg/dL      Peak postprandial:   less than 180 mg/dL (1-2 hours)      Critically ill patients:  140 - 180 mg/dL   Lab Results  Component Value Date   GLUCAP 163 (H) 12/23/2022   HGBA1C 6.4 07/14/2016    Review of Glycemic Control  Latest Reference Range & Units 12/23/22 03:00  Glucose 70 - 99 mg/dL 161 (H)  (H): Data is abnormally high   Latest Reference Range & Units 12/23/22 03:00  GFR, Estimated >60 mL/min 19 (L)  (L): Data is abnormally low  Diabetes history: DM2 Outpatient Diabetes medications: Metformin 750 mg BID (not taking) Current orders for Inpatient glycemic control: IV insulin  From SNF Dementia HHS Stroke?  Inpatient Diabetes Program Recommendations:    Please consider:  Semglee 6 units 2 hours prior to discontinuing IV insulin, then QD Novolog 0-6 units Q4H if NPO  Insurance is [facility].  They will fill any brand of insulin at DC.    Will continue to follow while inpatient.  Thank you, Dulce Sellar, MSN, CDCES Diabetes Coordinator Inpatient Diabetes Program [phone] (team pager from 8a-5p)

## 2022-12-23 NOTE — Progress Notes (Addendum)
Progress Note   Patient: [name] NWG:[id number] DOB: 1946-07-02 DOA: 12/22/2022     1 DOS: the patient was seen and examined on 12/23/2022   Brief hospital course: [name] was admitted to the hospital with the working diagnosis of hyperosmolar non ketotic state.   77 yo female nursing home resident with the past medical history of T2DM, schizophrenia, depression and CVA who presented with hyperglycemia. Patient was found to have a glucose of 1000 mg/dl, she was noted to be more lethargic and confused compared to her baseline.  She received subcutaneous insulin short acting and long acting along with IV fluids. She was transferred to the ED for further evaluation. On her initial physical examination her blood pressure was 112/82, HR 79, RR 19 and 02 saturation 97%, lungs with no wheezing or rales, heart with S1 and S2 present and rhythmic, abdomen sof and non tender, no lower extremity edema. She was somnolent but easy to arouse, positive confusion, but able to answer simple questions and follow simple commands.   VBG 7.32/ 37/ 64/ 21/ 90%  Na 147, K 4,1 Cl 115 bicarbonate 16, glucose 728m bun 76, cr 3,20  Anion gap 16. Mg 2,5  Lactic acid 4,0, 4,4 4,0 and 2,7  Wbc 14.2 hgb 12.5 plt 172    Head CT with new focal hypodensity within the right posterior frontal white matter near the vertex, indeterminate for acute to subacute infarct or small vessel disease.  Atrophy and chronic small vessel ischemic changes of the white matter.    Chest radiograph with no infiltrates or effusions. Mild hyperinflation.   EKG 77 bpm, normal axis, manually qtc 538, sinus rhythm with no significant ST segment or T wave changes.   Assessment and Plan: * Hyperosmolar hyperglycemic state (HHS) [facility]) Patient had mild DKA on admission down now has resolved.  Diet has been advanced and patient has been transitioned to sq insulin.  Approximately having 1 unit per hr on insulin drip, will transition to  long acting insulin 6 units bis and add correction insulin sliding scale with short acting insulin. Patient is naive to insulin therapy.   Plan to check BMP this pm at 17:00 and further adjust insulin therapy.  Target glucose inpatient 160 to 180 mg/dl.   Acute metabolic encephalopathy Multifactorial encephalopathy, likely to hyperosmolar state. CT head with possible CVA.   Mentation has improved but continue somnolent at the time of my examination.  Plan to continue neuro checks per unit protocol.  Control hyperglycemia.  Follow up with brain MRI.   Acute kidney failure (HCC) Hypernatremia.  Renal function with serum cr at 2,18 with K at 4,1 and serum bicarbonate at 21.  Na 148.  Plan to discontinue IV fluids and allow patient to have po.  Follow up renal function this pm and in am.  Check Mg.   Type 2 diabetes mellitus (HCC) At home patient has been taken off metformin.  Patient will need insulin therapy to control hyperglycemia.   Essential hypertension Resume blood pressure control with amlodipine and benazepril.   Schizophrenia (HCC) Resume donepezil, duloxetine, and mirtazapine.  Patient on trazodone and ziprasidone.         Subjective: Patient with no chest pain or dyspnea, no nausea or vomiting.   Physical Exam: Vitals:   12/22/22 2359 12/23/22 0529 12/23/22 0800 12/23/22 1200  BP: 102/60 (!) 142/78 (!) 158/81 (!) 173/72  Pulse: 75 72 63 67  Resp: 19 13 14 10   Temp: 98.1 F (36.7  C) 97.9 F (36.6 C)    TempSrc: Oral Oral    SpO2: 98% 99% 98% 100%  Weight:  44.8 kg    Height:       Neurology somnolent but easy to arouse, responds to simple questions ENT with mild pallor, oral mucosa mild dry Cardiovascular with S1 and S2 present and rhythmic with no gallops, rubs or murmurs No JVD No lower extremity edema Respiratory with no rales or wheezing, no rhonchi Abdomen with no distention  Data Reviewed:    Family Communication: I spoke with  patient's family at the bedside, we talked in detail about patient's condition, plan of care and prognosis and all questions were addressed.   Disposition: Status is: Inpatient Remains inpatient appropriate because: insulin therapy to control hyperglycemia   Planned Discharge Destination: Skilled nursing facility     Author: Coralie Keens, MD 12/23/2022 4:14 PM  For on call review [email or web address].

## 2022-12-23 NOTE — Plan of Care (Signed)
  Problem: Metabolic: Goal: Ability to maintain appropriate glucose levels will improve Outcome: Progressing   

## 2022-12-23 NOTE — Assessment & Plan Note (Signed)
At home patient has been taken off metformin.  Patient will need insulin therapy to control hyperglycemia.

## 2022-12-23 NOTE — Hospital Course (Signed)
[name] was admitted to the hospital with the working diagnosis of hyperosmolar non ketotic state.   77 yo female nursing home resident with the past medical history of T2DM, schizophrenia, depression and CVA who presented with hyperglycemia. Patient was found to have a glucose of 1000 mg/dl, she was noted to be more lethargic and confused compared to her baseline.  She received subcutaneous insulin short acting and long acting along with IV fluids. She was transferred to the ED for further evaluation. On her initial physical examination her blood pressure was 112/82, HR 79, RR 19 and 02 saturation 97%, lungs with no wheezing or rales, heart with S1 and S2 present and rhythmic, abdomen sof and non tender, no lower extremity edema. She was somnolent but easy to arouse, positive confusion, but able to answer simple questions and follow simple commands.   VBG 7.32/ 37/ 64/ 21/ 90%  Na 147, K 4,1 Cl 115 bicarbonate 16, glucose 731m bun 76, cr 3,20  Anion gap 16. Mg 2,5  Lactic acid 4,0, 4,4 4,0 and 2,7  Wbc 14.2 hgb 12.5 plt 172    Head CT with new focal hypodensity within the right posterior frontal white matter near the vertex, indeterminate for acute to subacute infarct or small vessel disease.  Atrophy and chronic small vessel ischemic changes of the white matter.    Chest radiograph with no infiltrates or effusions. Mild hyperinflation.   EKG 77 bpm, normal axis, manually qtc 538, sinus rhythm with no significant ST segment or T wave changes.

## 2022-12-23 NOTE — Assessment & Plan Note (Signed)
Resume blood pressure control with amlodipine and benazepril.

## 2022-12-23 NOTE — Progress Notes (Signed)
Pt. On EndoTool. CBG now within goal and  On call for [facility] paged to make aware. New orders received.

## 2022-12-23 NOTE — Plan of Care (Signed)
?  Problem: Clinical Measurements: ?Goal: Diagnostic test results will improve ?Outcome: Progressing ?  ?Problem: Safety: ?Goal: Ability to remain free from injury will improve ?Outcome: Progressing ?  ?

## 2022-12-23 NOTE — Assessment & Plan Note (Signed)
Resume donepezil, duloxetine, and mirtazapine.  Patient on trazodone and ziprasidone.

## 2022-12-23 NOTE — Assessment & Plan Note (Signed)
Multifactorial encephalopathy, likely to hyperosmolar state. CT head with possible CVA.   Mentation has improved but continue somnolent at the time of my examination.  Plan to continue neuro checks per unit protocol.  Control hyperglycemia.  Follow up with brain MRI.

## 2022-12-24 ENCOUNTER — Inpatient Hospital Stay (HOSPITAL_COMMUNITY): Payer: Medicare (Managed Care)

## 2022-12-24 DIAGNOSIS — I6523 Occlusion and stenosis of bilateral carotid arteries: Secondary | ICD-10-CM | POA: Diagnosis not present

## 2022-12-24 DIAGNOSIS — E1100 Type 2 diabetes mellitus with hyperosmolarity without nonketotic hyperglycemic-hyperosmolar coma (NKHHC): Secondary | ICD-10-CM | POA: Diagnosis not present

## 2022-12-24 DIAGNOSIS — I2609 Other pulmonary embolism with acute cor pulmonale: Secondary | ICD-10-CM

## 2022-12-24 LAB — LIPID PANEL
Cholesterol: 104 mg/dL (ref 0–200)
HDL: 45 mg/dL (ref >40)
LDL Cholesterol: 33 mg/dL (ref 0–99)
Total CHOL/HDL Ratio: 2.3 RATIO
Triglycerides: 131 mg/dL (ref <150)
VLDL: 26 mg/dL (ref 0–40)

## 2022-12-24 LAB — ECHOCARDIOGRAM COMPLETE
AR max vel: 1.78 cm2
AV Area VTI: 1.71 cm2
AV Area mean vel: 1.71 cm2
AV Mean grad: 3 mmHg
AV Peak grad: 6.8 mmHg
AV Vena cont: 0.35 cm
Ao pk vel: 1.3 m/s
Area-P 1/2: 3.17 cm2
Height: 63 in
S' Lateral: 2.2 cm
Weight: 1587.31 oz

## 2022-12-24 LAB — HEMOGLOBIN A1C
Hgb A1c MFr Bld: 13.5 % — ABNORMAL HIGH (ref 4.8–5.6)
Mean Plasma Glucose: 341 mg/dL

## 2022-12-24 LAB — BASIC METABOLIC PANEL
Anion gap: 8 (ref 5–15)
BUN: 37 mg/dL — ABNORMAL HIGH (ref 8–23)
CO2: 24 mmol/L (ref 22–32)
Calcium: 9.6 mg/dL (ref 8.9–10.3)
Chloride: 111 mmol/L (ref 98–111)
Creatinine, Ser: 1.74 mg/dL — ABNORMAL HIGH (ref 0.44–1.00)
GFR, Estimated: 30 mL/min — ABNORMAL LOW (ref >60)
Glucose, Bld: 147 mg/dL — ABNORMAL HIGH (ref 70–99)
Potassium: 3.8 mmol/L (ref 3.5–5.1)
Sodium: 143 mmol/L (ref 135–145)

## 2022-12-24 LAB — GLUCOSE, CAPILLARY
Glucose-Capillary: 115 mg/dL — ABNORMAL HIGH (ref 70–99)
Glucose-Capillary: 110 mg/dL — ABNORMAL HIGH (ref 70–99)
Glucose-Capillary: 103 mg/dL — ABNORMAL HIGH (ref 70–99)
Glucose-Capillary: 101 mg/dL — ABNORMAL HIGH (ref 70–99)
Glucose-Capillary: 91 mg/dL (ref 70–99)

## 2022-12-24 MED ORDER — CLOPIDOGREL BISULFATE 75 MG PO TABS
75.0000 mg | ORAL_TABLET | Freq: Every day | ORAL | Status: DC
  Administered 2022-12-24 – 2022-12-27 (×4): 75 mg via ORAL
  Filled 2022-12-24 (×4): qty 1

## 2022-12-24 NOTE — Consult Note (Signed)
NEUROLOGY CONSULTATION NOTE   Date of service: Dec 24, 2022 Patient Name: [name] MRN:  [id number] DOB:  28-Oct-1945 Reason for consult: "stroke on MRI" Requesting Provider: Coralie Keens,* _ _ _   _ __   _ __ _ _  __ __   _ __   __ _  History of Present Illness  [name] is a 77 y.o. female with PMH significant for DM2, depression, schizophrenia, prior stroke, HTN who presents with hyperglycemia and blod glucose of over 1000. She was lethargic, confused and disoriented.  She had CT Head which was concerning for a right frontal paramedian near the vertex hypodensity concerning for small vessel disease vs stroke. She had MRI Brain without contrast which showed an incidental left frontal white matter stroke.  Neurology consulted for further evaluation.  At baseline, patient is conversational but bedbound.  LKW: 12/22/22 mRS: 3 tNKASE: not offered, no symptoms attributable to stroke. Thrombectomy: not offered, no symptoms attributable to stroke. NIHSS components Score: Comment  1a Level of Conscious 0[]  1[x]  2[]  3[]      1b LOC Questions 0[]  1[]  2[x]       1c LOC Commands 0[x]  1[]  2[]       2 Best Gaze 0[x]  1[]  2[]       3 Visual 0[x]  1[]  2[]  3[]      4 Facial Palsy 0[x]  1[]  2[]  3[]      5a Motor Arm - left 0[x]  1[]  2[]  3[]  4[]  UN[]    5b Motor Arm - Right 0[x]  1[]  2[]  3[]  4[]  UN[]    6a Motor Leg - Left 0[x]  1[]  2[]  3[]  4[]  UN[]    6b Motor Leg - Right 0[x]  1[]  2[]  3[]  4[]  UN[]    7 Limb Ataxia 0[x]  1[]  2[]  3[]  UN[]     8 Sensory 0[x]  1[]  2[]  UN[]      9 Best Language 0[x]  1[]  2[]  3[]      10 Dysarthria 0[x]  1[]  2[]  UN[]      11 Extinct. and Inattention 0[x]  1[]  2[]       TOTAL: 3       ROS   Unable to obtain detailed ROS 2/2 somnolence. Denies any complaints.  Past History   Past Medical History:  Diagnosis Date   Anxiety    Cervical spondylosis with myelopathy    Complication of anesthesia    hard time waking up with last procedure years ago   COPD  (chronic obstructive pulmonary disease) (HCC)    Depression    Diabetes mellitus without complication (HCC)    Diabetic neuropathy (HCC)    Dyspnea    Headache    migraines   Hypertension    Memory deficit    Pancreatitis    Schizophrenia (HCC)    Stroke (HCC)    no deficits   Wears dentures    Wears glasses    Past Surgical History:  Procedure Laterality Date   ANTERIOR CERVICAL DECOMPRESSION/DISCECTOMY FUSION 4 LEVELS N/A 04/01/2016   Procedure: ANTERIOR CERVICAL DECOMPRESSION/DISCECTOMY FUSION CERVICAL THREE- CERVICAL FOUR, CERVICAL FOUR-CERVICAL FIVE, CORPECTOMY C6;  Surgeon: Lisbeth Renshaw, MD;  Location: [facility];  Service: Neurosurgery;  Laterality: N/A;  ANTERIOR CERVICAL DECOMPRESSION/DISCECTOMY FUSION C3-C4, C4-C5,C5-C6,C6-C7   FOOT SURGERY Right    MULTIPLE TOOTH EXTRACTIONS     POSTERIOR CERVICAL FUSION/FORAMINOTOMY N/A 06/09/2016   Procedure: Cervical three-four, four-five, five-six, six-seven posterior cervical fusion;  Surgeon: Lisbeth Renshaw, MD;  Location: [facility];  Service: Neurosurgery;  Laterality: N/A;   RHINOPLASTY     ROTATOR CUFF REPAIR  TONSILLECTOMY     TUBAL LIGATION     No family history on file. Social History   Socioeconomic History   Marital status: Widowed    Spouse name: Not on file   Number of children: Not on file   Years of education: Not on file   Highest education level: Not on file  Occupational History   Not on file  Tobacco Use   Smoking status: Former    Types: Cigarettes   Smokeless tobacco: Never   Tobacco comments:    in 2013  Substance and Sexual Activity   Alcohol use: No    Alcohol/week: 0.0 standard drinks of alcohol   Drug use: No   Sexual activity: Not on file  Other Topics Concern   Not on file  Social History Narrative   Lives with 2 sisters in a one story home.  Retired from [employer] work.  Education: GED    Social Determinants of Corporate investment banker Strain: Not on file  Food Insecurity:  Not on file  Transportation Needs: Not on file  Physical Activity: Not on file  Stress: Not on file  Social Connections: Not on file   Allergies  Allergen Reactions   Soy Allergy Other (See Comments)    Unknown reaction per [facility]   Vitis Viniferae Other (See Comments)    Grapes and raisins - unknown reaction per [facility]    Medications   Medications Prior to Admission  Medication Sig Dispense Refill Last Dose   acetaminophen (TYLENOL) 500 MG tablet Take 500 mg by mouth in the morning and at bedtime. for shoulder pain   12/22/2022 at am   amLODipine-benazepril (LOTREL) 5-10 MG capsule Take 1 capsule by mouth daily.   12/22/2022 at am   aspirin EC 81 MG tablet Take 81 mg by mouth daily.   12/22/2022 at am   cyanocobalamin 1000 MCG tablet Take 1,000 mcg by mouth daily.   12/22/2022 at am   dextromethorphan-guaiFENesin (ROBITUSSIN-DM) 10-100 MG/5ML liquid Take 10 mLs by mouth every 6 (six) hours as needed for cough.   UNKNOWN   donepezil (ARICEPT) 10 MG tablet Take 10 mg by mouth at bedtime.   12/21/2022 at pm   DULoxetine (CYMBALTA) 60 MG capsule Take 60 mg by mouth daily.   12/22/2022 at am   Ensure (ENSURE) Take 237 mLs by mouth 3 (three) times daily.   12/22/2022 at 1300   Menthol, Topical Analgesic, 4 % GEL Apply 1 application  topically in the morning and at bedtime. Apply to right shoulder for pain   12/22/2022 at am   [facility] OXIDE EX Apply 1 application  topically in the morning and at bedtime. Apply to sacrum and gluteal folds   12/22/2022 at am   mirtazapine (REMERON) 15 MG tablet Take 15 mg by mouth at bedtime.   12/21/2022 at pm   rosuvastatin (CRESTOR) 20 MG tablet Take 20 mg by mouth at bedtime.   12/21/2022 at pm   senna (SENOKOT) 8.6 MG TABS tablet Take 2 tablets by mouth at bedtime.   12/21/2022 at pm   traZODone (DESYREL) 100 MG tablet Take 100 mg by mouth at bedtime.   12/21/2022 at pm   UNABLE TO FIND Take by mouth in the morning and at bedtime. House Shake   12/22/2022 at 1200    ziprasidone (GEODON) 60 MG capsule Take 60 mg by mouth at bedtime.   12/21/2022 at pm   metFORMIN (GLUCOPHAGE-XR) 500 MG 24 hr tablet Take 750  mg by mouth in the morning and at bedtime. (Patient not taking: Reported on 12/23/2022)   Not Taking     Vitals   Vitals:   12/23/22 1600 12/23/22 2040 12/24/22 0103 12/24/22 0406  BP: (!) 156/99 (!) 167/69 128/75 119/62  Pulse: 75 71 78 62  Resp: 15  15   Temp:  98.1 F (36.7 C) 98.1 F (36.7 C) 98 F (36.7 C)  TempSrc:  Oral Oral Axillary  SpO2: 96% 100% 98% 100%  Weight:    45 kg  Height:         Body mass index is 17.57 kg/m.  Physical Exam   General: Laying comfortably in bed; in no acute distress.  HENT: Normal oropharynx and mucosa. Normal external appearance of ears and nose.  Neck: Supple, no pain or tenderness  CV: No JVD. No peripheral edema.  Pulmonary: Symmetric Chest rise. Normal respiratory effort.  Abdomen: Soft to touch, non-tender.  Ext: No cyanosis, edema, or deformity  Skin: No rash. Normal palpation of skin.   Musculoskeletal: Normal digits and nails by inspection. No clubbing.   Neurologic Examination  Mental status/Cognition: somnolent, opens eyes partially to loud voice, oriented to self only Speech/language: dysarthric speech, non fluent, comprehension intact to simple commands. object naming intact, difficulty with repeating. Cranial nerves:   CN II Pupils equal and reactive to light, no VF deficits    CN III,IV,VI EOM intact, no gaze preference or deviation, no nystagmus    CN V normal sensation in V1, V2, and V3 segments bilaterally    CN VII no asymmetry, no nasolabial fold flattening    CN VIII normal hearing to speech    CN IX & X normal palatal elevation, no uvular deviation    CN XI 5/5 head turn and 5/5 shoulder shrug bilaterally    CN XII midline tongue protrusion    Motor:  Muscle bulk: poor, tone normal Motor testing limited by somnolence. Holds BL upper extremities up and off the bed for  more than 10 secs without any drift. Hold BL lower extremities off the bed for more than 5 secs without any drift.  Sensation:  Light touch Grossly intact BL   Pin prick    Temperature    Vibration   Proprioception    Coordination/Complex Motor:  - Finger to Nose intact BL - Heel to shin unable to get her to do due to somnolence. - Rapid alternating movement are slowed. - Gait deferred for patient safety.  Labs   CBC:  Recent Labs  Lab 12/22/22 1755 12/22/22 1844  WBC 14.2*  --   NEUTROABS 11.0*  --   HGB 12.5 13.3  HCT 41.9 39.0  MCV 93.5  --   PLT 172  --     Basic Metabolic Panel:  Lab Results  Component Value Date   NA 143 12/24/2022   K 3.8 12/24/2022   CO2 24 12/24/2022   GLUCOSE 147 (H) 12/24/2022   BUN 37 (H) 12/24/2022   CREATININE 1.74 (H) 12/24/2022   CALCIUM 9.6 12/24/2022   GFRNONAA 30 (L) 12/24/2022   GFRAA 52 (L) 01/25/2017   Lipid Panel: No results found for: "LDLCALC" HgbA1c:  Lab Results  Component Value Date   HGBA1C 13.5 (H) 12/22/2022   Urine Drug Screen: No results found for: "LABOPIA", "COCAINSCRNUR", "LABBENZ", "AMPHETMU", "THCU", "LABBARB"  Alcohol Level No results found for: "ETH"  CT Head without contrast(Personally reviewed): 1. New focal hypodensity within the right posterior frontal white matter near  the vertex, indeterminate for acute to subacute infarct or small vessel disease. Consider correlation with MRI. 2. Atrophy and chronic small vessel ischemic changes of the white matter. Slight interval ventricular enlargement, probably related to atrophy progression.  MR Angio Head and US carotid duplex: pending  MRI Brain(Personally reviewed): 1. Small area of subacute ischemia within the left frontal white matter. No hemorrhage or mass effect. 2. Area of late subacute to chronic ischemia at the right lentiform nucleus.  Impression   [name] is a 77 y.o. female with PMH significant for DM2, depression,  schizophrenia, prior stroke, HTN who presents with hyperglycemia and blod glucose of over 1000. She was lethargic, confused and disoriented. Workup with CT Head concerning for right frontal paramedian near the vertex hypodensity concerning for small vessel disease vs stroke. She had MRI Brain without contrast which showed an incidental left frontal white matter stroke. Her neurologic examination is notable for no focal deficit, executive dysfunction  Recommendations  - Frequent Neuro checks per stroke unit protocol - Recommend brain imaging with MRI Brain without contrast - Recommend Vascular imaging with MRA Angio Head without contrast and US Carotid doppler - Recommend obtaining TTE  - Recommend obtaining Lipid panel with LDL - Please start statin if LDL > 70 - Recommend HbA1c to evaluate for diabetes and how well it is controlled. - Antithrombotic - Aspirin 81mg  daily along with plavix 75mg  daily x 21 days, followed by Aspirin 81mg  daily alone - Recommend DVT ppx - SBP goal - permissive hypertension first 24 h < 220/110. Held home meds.  - Recommend Telemetry monitoring for arrythmia - Recommend bedside swallow screen prior to PO intake. - Stroke education booklet - Recommend PT/OT/SLP consult  ______________________________________________________________________   Thank you for the opportunity to take part in the care of this patient. If you have any further questions, please contact the neurology consultation attending.  Signed,  Erick Blinks [facility] Pager Number [phone] _ _ _   _ __   _ __ _ _  __ __   _ __   __ _

## 2022-12-24 NOTE — Progress Notes (Signed)
STROKE TEAM PROGRESS NOTE   INTERVAL HISTORY Patient seen and evaluated this morning. Daughter at bedside. Per daughter, she is a resident at a nursing home and was brought here due to lethargy and confusion. MRI with new left frontal white matter stroke, neuro consulted. Per daughter, patient is at her baseline, she does not appears to have any new deficits.     Vitals:   12/24/22 0406 12/24/22 0710 12/24/22 0800 12/24/22 1126  BP: 119/62 131/82 134/71 129/78  Pulse: 62 77 83 76  Resp:  15 11 15   Temp: 98 F (36.7 C) 98 F (36.7 C) 97.8 F (36.6 C) 97.8 F (36.6 C)  TempSrc: Axillary Oral Oral Oral  SpO2: 100% 95% 95% 99%  Weight: 45 kg     Height:       CBC:  Recent Labs  Lab 12/22/22 1755 12/22/22 1844  WBC 14.2*  --   NEUTROABS 11.0*  --   HGB 12.5 13.3  HCT 41.9 39.0  MCV 93.5  --   PLT 172  --    Basic Metabolic Panel:  Recent Labs  Lab 12/22/22 1755 12/22/22 1844 12/23/22 1636 12/24/22 0032  NA 147*   < > 144 143  K 4.1   < > 4.2 3.8  CL 115*   < > 115* 111  CO2 16*   < > 20* 24  GLUCOSE 751*   < > 244* 147*  BUN 76*   < > 47* 37*  CREATININE 3.20*   < > 2.01* 1.74*  CALCIUM 10.4*   < > 9.7 9.6  MG 2.5*  --   --   --    < > = values in this interval not displayed.   Lipid Panel:  Recent Labs  Lab 12/24/22 0028  CHOL 104  TRIG 131  HDL 45  CHOLHDL 2.3  VLDL 26  LDLCALC 33   HgbA1c:  Recent Labs  Lab 12/22/22 2221  HGBA1C 13.5*   Urine Drug Screen: No results for input(s): "LABOPIA", "COCAINSCRNUR", "LABBENZ", "AMPHETMU", "THCU", "LABBARB" in the last 168 hours.  Alcohol Level No results for input(s): "ETH" in the last 168 hours.  IMAGING past 24 hours VAS US CAROTID  Result Date: 12/24/2022 Carotid Arterial Duplex Study Patient Name:  [name]  Date of Exam:   12/24/2022 Medical Rec #: [id number]           Accession #:    [id number] Date of Birth: 1946-02-11           Patient Gender: F Patient Age:   77 years Exam Location:  [facility] Procedure:      VAS US CAROTID Referring Phys: Terrilee Files [facility] --------------------------------------------------------------------------------  Indications:       CVA. Risk Factors:      Hypertension, Diabetes, prior CVA. Comparison Study:  No prior study Performing Technologist: Sherren Kerns RVS  Examination Guidelines: A complete evaluation includes B-mode imaging, spectral Doppler, color Doppler, and power Doppler as needed of all accessible portions of each vessel. Bilateral testing is considered an integral part of a complete examination. Limited examinations for reoccurring indications may be performed as noted.  Right Carotid Findings: +----------+--------+--------+--------+----------------------+--------+           PSV cm/sEDV cm/sStenosisPlaque Description    Comments +----------+--------+--------+--------+----------------------+--------+ CCA Prox  43      9               heterogenous                   +----------+--------+--------+--------+----------------------+--------+  CCA Distal43      7               heterogenous                   +----------+--------+--------+--------+----------------------+--------+ ICA Prox  53      14      1-39%   irregular and calcific         +----------+--------+--------+--------+----------------------+--------+ ICA Mid   72      18                                             +----------+--------+--------+--------+----------------------+--------+ ICA Distal58      18                                             +----------+--------+--------+--------+----------------------+--------+ ECA       67      6                                              +----------+--------+--------+--------+----------------------+--------+ +----------+--------+-------+--------+-------------------+           PSV cm/sEDV cmsDescribeArm Pressure (mmHG) +----------+--------+-------+--------+-------------------+ [id number]                                          +----------+--------+-------+--------+-------------------+ +---------+--------+--+--------+-+ VertebralPSV cm/s29EDV cm/s5 +---------+--------+--+--------+-+  Left Carotid Findings: +----------+--------+--------+--------+---------------------+------------------+           PSV cm/sEDV cm/sStenosisPlaque Description   Comments           +----------+--------+--------+--------+---------------------+------------------+ CCA Prox  62      12                                   intimal thickening +----------+--------+--------+--------+---------------------+------------------+ CCA Distal45      8                                    intimal thickening +----------+--------+--------+--------+---------------------+------------------+ ICA Prox  60      14      1-39%   irregular and                                                             calcific                                +----------+--------+--------+--------+---------------------+------------------+ ICA Mid   73      26                                                      +----------+--------+--------+--------+---------------------+------------------+  ICA Distal51      12                                                      +----------+--------+--------+--------+---------------------+------------------+ ECA       69      15                                                      +----------+--------+--------+--------+---------------------+------------------+ +----------+--------+--------+--------+-------------------+           PSV cm/sEDV cm/sDescribeArm Pressure (mmHG) +----------+--------+--------+--------+-------------------+ [id number]                                          +----------+--------+--------+--------+-------------------+ +---------+--------+--+--------+--+ VertebralPSV cm/s50EDV cm/s17 +---------+--------+--+--------+--+   Summary: Right  Carotid: Velocities in the right ICA are consistent with a 1-39% stenosis. Left Carotid: Velocities in the left ICA are consistent with a 1-39% stenosis. Vertebrals:  Bilateral vertebral arteries demonstrate antegrade flow. Subclavians: Normal flow hemodynamics were seen in bilateral subclavian              arteries. *See table(s) above for measurements and observations.     Preliminary    ECHOCARDIOGRAM COMPLETE  Result Date: 12/24/2022    ECHOCARDIOGRAM REPORT   Patient Name:   [name] Date of Exam: 12/24/2022 Medical Rec #:  [id number]          Height:       63.0 in Accession #:    [id number]         Weight:       99.2 lb Date of Birth:  1946/05/29          BSA:          1.435 m Patient Age:    76 years           BP:           131/82 mmHg Patient Gender: F                  HR:           67 bpm. Exam Location:  Inpatient Procedure: 2D Echo, Cardiac Doppler and Color Doppler Indications:    Pulmonary Embolus I26.09  History:        Patient has no prior history of Echocardiogram examinations.                 Risk Factors:Diabetes, Hypertension and Former Smoker.  Sonographer:    Dondra Prader RVT RCS Referring Phys: [id number] [facility] IMPRESSIONS  1. Appears to be narrow LVOT and partial SAM no LVOT gradient noted and no obvious sub valvular web. Left ventricular ejection fraction, by estimation, is 60 to 65%. The left ventricle has normal function. The left ventricle has no regional wall motion abnormalities. There is severe left ventricular hypertrophy. Left ventricular diastolic parameters are consistent with Grade I diastolic dysfunction (impaired relaxation).  2. Right ventricular systolic function is normal. The right ventricular size is normal.  3. The mitral valve is normal in structure. No evidence of mitral valve regurgitation. No evidence of mitral  stenosis.  4. The aortic valve is tricuspid. There is moderate calcification of the aortic valve. There is moderate thickening of the aortic  valve. Aortic valve regurgitation is mild. Mild aortic valve stenosis.  5. The inferior vena cava is normal in size with greater than 50% respiratory variability, suggesting right atrial pressure of 3 mmHg. FINDINGS  Left Ventricle: Appears to be narrow LVOT and partial SAM no LVOT gradient noted and no obvious sub valvular web. Left ventricular ejection fraction, by estimation, is 60 to 65%. The left ventricle has normal function. The left ventricle has no regional  wall motion abnormalities. The left ventricular internal cavity size was normal in size. There is severe left ventricular hypertrophy. Left ventricular diastolic parameters are consistent with Grade I diastolic dysfunction (impaired relaxation). Right Ventricle: The right ventricular size is normal. No increase in right ventricular wall thickness. Right ventricular systolic function is normal. Left Atrium: Left atrial size was normal in size. Right Atrium: Right atrial size was normal in size. Pericardium: There is no evidence of pericardial effusion. Mitral Valve: The mitral valve is normal in structure. No evidence of mitral valve regurgitation. No evidence of mitral valve stenosis. Tricuspid Valve: The tricuspid valve is normal in structure. Tricuspid valve regurgitation is mild . No evidence of tricuspid stenosis. Aortic Valve: The aortic valve is tricuspid. There is moderate calcification of the aortic valve. There is moderate thickening of the aortic valve. Aortic valve regurgitation is mild. Mild aortic stenosis is present. Aortic valve mean gradient measures 3.0 mmHg. Aortic valve peak gradient measures 6.8 mmHg. Aortic valve area, by VTI measures 1.71 cm. Pulmonic Valve: The pulmonic valve was normal in structure. Pulmonic valve regurgitation is mild. No evidence of pulmonic stenosis. Aorta: The aortic root is normal in size and structure. Venous: The inferior vena cava is normal in size with greater than 50% respiratory variability,  suggesting right atrial pressure of 3 mmHg. IAS/Shunts: No atrial level shunt detected by color flow Doppler.  LEFT VENTRICLE PLAX 2D LVIDd:         3.50 cm   Diastology LVIDs:         2.20 cm   LV e' medial:    4.53 cm/s LV PW:         1.50 cm   LV E/e' medial:  7.8 LV IVS:        1.70 cm   LV e' lateral:   5.70 cm/s LVOT diam:     1.40 cm   LV E/e' lateral: 6.2 LV SV:         39 LV SV Index:   27 LVOT Area:     1.54 cm  RIGHT VENTRICLE             IVC RV Basal diam:  3.10 cm     IVC diam: 0.90 cm RV Mid diam:    2.80 cm RV S prime:     10.10 cm/s TAPSE (M-mode): 2.4 cm LEFT ATRIUM             Index        RIGHT ATRIUM          Index LA diam:        2.80 cm 1.95 cm/m   RA Area:     8.40 cm LA Vol (A2C):   25.6 ml 17.83 ml/m  RA Volume:   15.20 ml 10.59 ml/m LA Vol (A4C):   20.9 ml 14.56 ml/m LA Biplane Vol: 23.1 ml 16.09 ml/m  AORTIC VALVE                    PULMONIC VALVE AV Area (Vmax):    1.78 cm     PV Vmax:          1.07 m/s AV Area (Vmean):   1.71 cm     PV Peak grad:     4.6 mmHg AV Area (VTI):     1.71 cm     PR End Diast Vel: 3.88 msec AV Vmax:           130.00 cm/s AV Vmean:          84.100 cm/s AV VTI:            0.227 m AV Peak Grad:      6.8 mmHg AV Mean Grad:      3.0 mmHg LVOT Vmax:         150.00 cm/s LVOT Vmean:        93.400 cm/s LVOT VTI:          0.252 m LVOT/AV VTI ratio: 1.11 AR Vena Contracta: 0.35 cm  AORTA Ao Root diam: 2.50 cm Ao Asc diam:  3.60 cm MITRAL VALVE               TRICUSPID VALVE MV Area (PHT): 3.17 cm    TR Peak grad:   14.0 mmHg MV Decel Time: 239 msec    TR Vmax:        187.00 cm/s MV E velocity: 35.30 cm/s MV A velocity: 59.10 cm/s  SHUNTS MV E/A ratio:  0.60        Systemic VTI:  0.25 m                            Systemic Diam: 1.40 cm Charlton Haws MD Electronically signed by Charlton Haws MD Signature Date/Time: 12/24/2022/11:16:59 AM    Final    MR BRAIN WO CONTRAST  Result Date: 12/23/2022 CLINICAL DATA:  Acute neurologic deficit EXAM: MRI HEAD WITHOUT  CONTRAST TECHNIQUE: Multiplanar, multiecho pulse sequences of the brain and surrounding structures were obtained without intravenous contrast. COMPARISON:  01/26/2017 FINDINGS: Brain: There is a small area of subacute ischemia within the left frontal white matter, with no ADC correlate. Area of late subacute to chronic ischemia at the right lentiform nucleus. No acute or chronic hemorrhage. There is multifocal hyperintense T2-weighted signal within the white matter. Generalized volume loss. The midline structures are normal. Vascular: Major flow voids are preserved. Skull and upper cervical spine: Normal calvarium and skull base. Visualized upper cervical spine and soft tissues are normal. Sinuses/Orbits:No paranasal sinus fluid levels or advanced mucosal thickening. No mastoid or middle ear effusion. Normal orbits. IMPRESSION: 1. Small area of subacute ischemia within the left frontal white matter. No hemorrhage or mass effect. 2. Area of late subacute to chronic ischemia at the right lentiform nucleus. Electronically Signed   By: Deatra Robinson M.D.   On: 12/23/2022 19:11    PHYSICAL EXAM  Temp:  [97.8 F (36.6 C)-98.1 F (36.7 C)] 97.8 F (36.6 C) (05/25 1126) Pulse Rate:  [62-83] 76 (05/25 1126) Resp:  [11-15] 15 (05/25 1126) BP: (119-167)/(62-82) 129/78 (05/25 1126) SpO2:  [95 %-100 %] 99 % (05/25 1126) Weight:  [45 kg] 45 kg (05/25 0406)  General - Well nourished, well developed, in no apparent distress.  Mental Status -  Awake, alert, recognized her daughter, knows that she  is in the hospital but does not know the date. Knows her birth date and month but not year.  Language including expression, naming, repetition, comprehension was assessed and found to have moderate dysarthria.  Cranial Nerves II - XII - II - Visual field intact OU. III, IV, VI - Extraocular movements intact. V - Facial sensation intact bilaterally. VII - Facial movement intact bilaterally. VIII - Hearing &  vestibular intact bilaterally. X - Palate elevates symmetrically. XI - Chin turning & shoulder shrug intact bilaterally. XII - Tongue protrusion intact.  Motor Strength - The patient's strength was normal in all extremities and pronator drift was absent.  Bulk was normal and fasciculations were absent.   Motor Tone - Muscle tone was assessed at the neck and appendages and was normal.  Sensory - Light touch were assessed and were symmetrical.    Gait and Station - deferred.   ASSESSMENT/PLAN [name] is a 76 y.o. female with history of DMII, Hypertension, prior stroke presenting with lethargy and confusion in the setting of hyperglycemia found to have a new left frontal white matter stroke. Stroke etiology likely small vessel disease but cannot rule out large vessel disease. .   Stroke:   Left frontal white matter  Etiology:  Small vessel disease but cannot rule out large vessel disease   Code Stroke Right posterior frontal white matter near the vertex.  Chronic small vessel disease and Atrophy.    MRI  Subacute left frontal white matter MRA  pending  Carotid Doppler  Pending  2D Echo EF 60-65% LDL 33 HgbA1c 13.5 VTE prophylaxis - Lovenox    Diet   Diet heart healthy/carb modified Fluid consistency: Thin   No antithrombotic prior to admission, now on No antithrombotic.  Therapy recommendations:  Pending  Disposition:  Pending but probably back to nursing home   Hypertension Home meds: Amlodipine-Benazepril  Stable Permissive hypertension (OK if < 220/120) but gradually normalize in 5-7 days Long-term BP goal normotensive  Hyperlipidemia Home meds:  Crestor 20,resumed in hospital LDL 33, goal < 70 Continue statin at discharge  Diabetes type II Uncontrolled Home meds:  Metformin HgbA1c 13.5, goal < 7.0 CBGs Recent Labs    12/23/22 2111 12/24/22 0625 12/24/22 1124  GLUCAP 147* 115* 110*    SSI  Other Stroke Risk Factors  Advanced Age >/= 10  Hx  stroke/TIA  Other Active Problems   Hospital day # 2    I, the attending neurologist, have personally obtained a history, examined the patient, evaluated laboratory data, individually viewed imaging studies and agree with radiology interpretations. I obtained additional history from pt's daughter at bedside. Together with the NP/PA, we formulated the assessment and plan of care which reflects our mutual decision.  I have made any additions or clarifications directly to the above note and agree with the findings and plan as currently documented. I have spent 34 minutes in the care of this patient   Windell Norfolk, MD Neurology  12/24/2022 4:46 PM      Windell Norfolk, MD Neurology   To contact Stroke Continuity provider, please refer to [email or web address]. After hours, contact General Neurology

## 2022-12-24 NOTE — Progress Notes (Signed)
PROGRESS NOTE    [name]  ZOX:[id number]  DOB: 03/09/1946  DOA: 12/22/2022 PCP: Robin Oaks, NP Outpatient Specialists:   Cross course:  77 yo female nursing home resident with the past medical history of T2DM, schizophrenia, depression and CVA who presented with hyperglycemia with a glucose of 1000 mg/dl.  She was lethargic and confused.  Patient's glucose responded well to treatment with careful hydration with normalization of glucose.  Workup for acute metabolic encephalopathy revealed head CT with new focal hypodensity in right posterior frontal white matter.  Patient was seen by neurology who recommended workup for new stroke.  Subjective:  Patient seen in conjunction with her daughter [name].  Patient is a very low voice and has clear dysarthria but is able to answer questions appropriately.  Does not appear to have a comprehension deficit.  Feels she is doing okay and has no complaints.  Objective: Vitals:   12/24/22 0406 12/24/22 0710 12/24/22 0800 12/24/22 1126  BP: 119/62 131/82 134/71 129/78  Pulse: 62 77 83 76  Resp:  15 11 15   Temp: 98 F (36.7 C) 98 F (36.7 C) 97.8 F (36.6 C) 97.8 F (36.6 C)  TempSrc: Axillary Oral Oral Oral  SpO2: 100% 95% 95% 99%  Weight: 45 kg     Height:        Intake/Output Summary (Last 24 hours) at 12/24/2022 1549 Last data filed at 12/24/2022 1422 Gross per 24 hour  Intake 1688.04 ml  Output 700 ml  Net 988.04 ml   Filed Weights   12/22/22 2048 12/23/22 0529 12/24/22 0406  Weight: 46.3 kg 44.8 kg 45 kg     Exam:  General: Chronically ill-appearing female lying in bed with her eyes open, responding slowly with dysarthria but with some humor. Eyes: sclera anicteric, conjuctiva mild injection bilaterally CVS: S1-S2, regular  Respiratory:  decreased air entry bilaterally secondary to decreased inspiratory effort, rales at bases  GI: NABS, soft, NT  LE: Warm and well-perfused   Data Reviewed:  Basic  Metabolic Panel: Recent Labs  Lab 12/22/22 1755 12/22/22 1844 12/23/22 0110 12/23/22 0300 12/23/22 1115 12/23/22 1636 12/24/22 0032  NA 147*   < > 153* 152* 148* 144 143  K 4.1   < > 4.0 4.0 4.1 4.2 3.8  CL 115*   < > 120* 120* 118* 115* 111  CO2 16*   < > 21* 22 21* 20* 24  GLUCOSE 751*   < > 212* 166* 201* 244* 147*  BUN 76*   < > 64* 62* 51* 47* 37*  CREATININE 3.20*   < > 2.69* 2.56* 2.18* 2.01* 1.74*  CALCIUM 10.4*   < > 10.2 9.9 9.9 9.7 9.6  MG 2.5*  --   --   --   --   --   --    < > = values in this interval not displayed.    CBC: Recent Labs  Lab 12/22/22 1755 12/22/22 1844  WBC 14.2*  --   NEUTROABS 11.0*  --   HGB 12.5 13.3  HCT 41.9 39.0  MCV 93.5  --   PLT 172  --      Scheduled Meds:  amLODipine  5 mg Oral Daily   aspirin EC  81 mg Oral Daily   benazepril  10 mg Oral Daily   donepezil  10 mg Oral QHS   DULoxetine  60 mg Oral Daily   enoxaparin (LOVENOX) injection  30 mg Subcutaneous Q24H   insulin  aspart  0-9 Units Subcutaneous TID WC   insulin glargine-yfgn  6 Units Subcutaneous BID   mirtazapine  15 mg Oral QHS   rosuvastatin  20 mg Oral QHS   traZODone  100 mg Oral QHS   ziprasidone  60 mg Oral QHS   Continuous Infusions:  sodium chloride 75 mL/hr at 12/24/22 1309     Assessment & Plan:   Hyperosmolar hyperglycemia DM2 AKI on CKD Blood glucoses have been well treated with hydration with now normalization of blood sugars AKI has also improved with hydration, approaching baseline creatinine which appears to be 1.4 Insulin drip has been discontinued and patient is on 6 units of glargine with SSI Patient remains on NS at 75, follow ins and outs closely   Acute metabolic encephalopathy CVA Dementia Schizophrenia Patient's daughter states that her mental status is very close to baseline Patient presently on code stroke protocol MRA of head, TTE and carotid Dopplers were ordered, patient is on telemetry She is already on aspirin, will  add Plavix x 21 days LDL is 33 so will not start statin will request PT/OT/SLP Continue donepezil, Geodon/ziprasidone, trazodone, duloxetine and mirtazapine  HTN Will hold amlodipine and benazepril x 24-hour for permissive hypertension    DVT prophylaxis: Lovenox Code Status: DNR Family Communication: Patient's daughter was at bedside throughout   Studies: VAS US CAROTID  Result Date: 12/24/2022 Carotid Arterial Duplex Study Patient Name:  [name]  Date of Exam:   12/24/2022 Medical Rec #: [id number]           Accession #:    [id number] Date of Birth: [birth year]           Patient Gender: F Patient Age:   77 years Exam Location:  [facility] Procedure:      VAS US CAROTID Referring Phys: [name] --------------------------------------------------------------------------------  Indications:       CVA. Risk Factors:      Hypertension, Diabetes, prior CVA. Comparison Study:  No prior study Performing Technologist: [name] RVS  Examination Guidelines: A complete evaluation includes B-mode imaging, spectral Doppler, color Doppler, and power Doppler as needed of all accessible portions of each vessel. Bilateral testing is considered an integral part of a complete examination. Limited examinations for reoccurring indications may be performed as noted.  Right Carotid Findings: +----------+--------+--------+--------+----------------------+--------+           PSV cm/sEDV cm/sStenosisPlaque Description    Comments +----------+--------+--------+--------+----------------------+--------+ CCA Prox  43      9               heterogenous                   +----------+--------+--------+--------+----------------------+--------+ CCA Distal43      7               heterogenous                   +----------+--------+--------+--------+----------------------+--------+ ICA Prox  53      14      1-39%   irregular and calcific          +----------+--------+--------+--------+----------------------+--------+ ICA Mid   72      18                                             +----------+--------+--------+--------+----------------------+--------+ ICA Distal58      18                                             +----------+--------+--------+--------+----------------------+--------+  ECA       67      6                                              +----------+--------+--------+--------+----------------------+--------+ +----------+--------+-------+--------+-------------------+           PSV cm/sEDV cmsDescribeArm Pressure (mmHG) +----------+--------+-------+--------+-------------------+ [id number]                                         +----------+--------+-------+--------+-------------------+ +---------+--------+--+--------+-+ VertebralPSV cm/s29EDV cm/s5 +---------+--------+--+--------+-+  Left Carotid Findings: +----------+--------+--------+--------+---------------------+------------------+           PSV cm/sEDV cm/sStenosisPlaque Description   Comments           +----------+--------+--------+--------+---------------------+------------------+ CCA Prox  62      12                                   intimal thickening +----------+--------+--------+--------+---------------------+------------------+ CCA Distal45      8                                    intimal thickening +----------+--------+--------+--------+---------------------+------------------+ ICA Prox  60      14      1-39%   irregular and                                                             calcific                                +----------+--------+--------+--------+---------------------+------------------+ ICA Mid   73      26                                                      +----------+--------+--------+--------+---------------------+------------------+ ICA Distal51      12                                                       +----------+--------+--------+--------+---------------------+------------------+ ECA       69      15                                                      +----------+--------+--------+--------+---------------------+------------------+ +----------+--------+--------+--------+-------------------+           PSV cm/sEDV cm/sDescribeArm Pressure (mmHG) +----------+--------+--------+--------+-------------------+ [id number]                                          +----------+--------+--------+--------+-------------------+ +---------+--------+--+--------+--+  VertebralPSV cm/s50EDV cm/s17 +---------+--------+--+--------+--+   Summary: Right Carotid: Velocities in the right ICA are consistent with a 1-39% stenosis. Left Carotid: Velocities in the left ICA are consistent with a 1-39% stenosis. Vertebrals:  Bilateral vertebral arteries demonstrate antegrade flow. Subclavians: Normal flow hemodynamics were seen in bilateral subclavian              arteries. *See table(s) above for measurements and observations.     Preliminary    ECHOCARDIOGRAM COMPLETE  Result Date: 12/24/2022    ECHOCARDIOGRAM REPORT   Patient Name:   ELAYAH ENWRIGHT Date of Exam: 12/24/2022 Medical Rec #:  [id number]          Height:       63.0 in Accession #:    [id number]         Weight:       99.2 lb Date of Birth:  [birth year]          BSA:          1.435 m Patient Age:    76 years           BP:           131/82 mmHg Patient Gender: F                  HR:           67 bpm. Exam Location:  Inpatient Procedure: 2D Echo, Cardiac Doppler and Color Doppler Indications:    Pulmonary Embolus I26.09  History:        Patient has no prior history of Echocardiogram examinations.                 Risk Factors:Diabetes, Hypertension and Former Smoker.  Sonographer:    Dondra Prader RVT RCS Referring Phys: [id number] [facility] IMPRESSIONS  1. Appears to be narrow LVOT and partial SAM no LVOT gradient  noted and no obvious sub valvular web. Left ventricular ejection fraction, by estimation, is 60 to 65%. The left ventricle has normal function. The left ventricle has no regional wall motion abnormalities. There is severe left ventricular hypertrophy. Left ventricular diastolic parameters are consistent with Grade I diastolic dysfunction (impaired relaxation).  2. Right ventricular systolic function is normal. The right ventricular size is normal.  3. The mitral valve is normal in structure. No evidence of mitral valve regurgitation. No evidence of mitral stenosis.  4. The aortic valve is tricuspid. There is moderate calcification of the aortic valve. There is moderate thickening of the aortic valve. Aortic valve regurgitation is mild. Mild aortic valve stenosis.  5. The inferior vena cava is normal in size with greater than 50% respiratory variability, suggesting right atrial pressure of 3 mmHg. FINDINGS  Left Ventricle: Appears to be narrow LVOT and partial SAM no LVOT gradient noted and no obvious sub valvular web. Left ventricular ejection fraction, by estimation, is 60 to 65%. The left ventricle has normal function. The left ventricle has no regional  wall motion abnormalities. The left ventricular internal cavity size was normal in size. There is severe left ventricular hypertrophy. Left ventricular diastolic parameters are consistent with Grade I diastolic dysfunction (impaired relaxation). Right Ventricle: The right ventricular size is normal. No increase in right ventricular wall thickness. Right ventricular systolic function is normal. Left Atrium: Left atrial size was normal in size. Right Atrium: Right atrial size was normal in size. Pericardium: There is no evidence of pericardial effusion. Mitral Valve: The mitral valve is normal in structure. No evidence  of mitral valve regurgitation. No evidence of mitral valve stenosis. Tricuspid Valve: The tricuspid valve is normal in structure. Tricuspid valve  regurgitation is mild . No evidence of tricuspid stenosis. Aortic Valve: The aortic valve is tricuspid. There is moderate calcification of the aortic valve. There is moderate thickening of the aortic valve. Aortic valve regurgitation is mild. Mild aortic stenosis is present. Aortic valve mean gradient measures 3.0 mmHg. Aortic valve peak gradient measures 6.8 mmHg. Aortic valve area, by VTI measures 1.71 cm. Pulmonic Valve: The pulmonic valve was normal in structure. Pulmonic valve regurgitation is mild. No evidence of pulmonic stenosis. Aorta: The aortic root is normal in size and structure. Venous: The inferior vena cava is normal in size with greater than 50% respiratory variability, suggesting right atrial pressure of 3 mmHg. IAS/Shunts: No atrial level shunt detected by color flow Doppler.  LEFT VENTRICLE PLAX 2D LVIDd:         3.50 cm   Diastology LVIDs:         2.20 cm   LV e' medial:    4.53 cm/s LV PW:         1.50 cm   LV E/e' medial:  7.8 LV IVS:        1.70 cm   LV e' lateral:   5.70 cm/s LVOT diam:     1.40 cm   LV E/e' lateral: 6.2 LV SV:         39 LV SV Index:   27 LVOT Area:     1.54 cm  RIGHT VENTRICLE             IVC RV Basal diam:  3.10 cm     IVC diam: 0.90 cm RV Mid diam:    2.80 cm RV S prime:     10.10 cm/s TAPSE (M-mode): 2.4 cm LEFT ATRIUM             Index        RIGHT ATRIUM          Index LA diam:        2.80 cm 1.95 cm/m   RA Area:     8.40 cm LA Vol (A2C):   25.6 ml 17.83 ml/m  RA Volume:   15.20 ml 10.59 ml/m LA Vol (A4C):   20.9 ml 14.56 ml/m LA Biplane Vol: 23.1 ml 16.09 ml/m  AORTIC VALVE                    PULMONIC VALVE AV Area (Vmax):    1.78 cm     PV Vmax:          1.07 m/s AV Area (Vmean):   1.71 cm     PV Peak grad:     4.6 mmHg AV Area (VTI):     1.71 cm     PR End Diast Vel: 3.88 msec AV Vmax:           130.00 cm/s AV Vmean:          84.100 cm/s AV VTI:            0.227 m AV Peak Grad:      6.8 mmHg AV Mean Grad:      3.0 mmHg LVOT Vmax:         150.00 cm/s LVOT  Vmean:        93.400 cm/s LVOT VTI:          0.252 m LVOT/AV VTI ratio: 1.11 AR Vena  Contracta: 0.35 cm  AORTA Ao Root diam: 2.50 cm Ao Asc diam:  3.60 cm MITRAL VALVE               TRICUSPID VALVE MV Area (PHT): 3.17 cm    TR Peak grad:   14.0 mmHg MV Decel Time: 239 msec    TR Vmax:        187.00 cm/s MV E velocity: 35.30 cm/s MV A velocity: 59.10 cm/s  SHUNTS MV E/A ratio:  0.60        Systemic VTI:  0.25 m                            Systemic Diam: 1.40 cm Charlton Haws MD Electronically signed by Charlton Haws MD Signature Date/Time: 12/24/2022/11:16:59 AM    Final    MR BRAIN WO CONTRAST  Result Date: 12/23/2022 CLINICAL DATA:  Acute neurologic deficit EXAM: MRI HEAD WITHOUT CONTRAST TECHNIQUE: Multiplanar, multiecho pulse sequences of the brain and surrounding structures were obtained without intravenous contrast. COMPARISON:  01/26/2017 FINDINGS: Brain: There is a small area of subacute ischemia within the left frontal white matter, with no ADC correlate. Area of late subacute to chronic ischemia at the right lentiform nucleus. No acute or chronic hemorrhage. There is multifocal hyperintense T2-weighted signal within the white matter. Generalized volume loss. The midline structures are normal. Vascular: Major flow voids are preserved. Skull and upper cervical spine: Normal calvarium and skull base. Visualized upper cervical spine and soft tissues are normal. Sinuses/Orbits:No paranasal sinus fluid levels or advanced mucosal thickening. No mastoid or middle ear effusion. Normal orbits. IMPRESSION: 1. Small area of subacute ischemia within the left frontal white matter. No hemorrhage or mass effect. 2. Area of late subacute to chronic ischemia at the right lentiform nucleus. Electronically Signed   By: Deatra Robinson M.D.   On: 12/23/2022 19:11   DG CHEST PORT 1 VIEW  Result Date: 12/22/2022 CLINICAL DATA:  Hyperglycemia. EXAM: PORTABLE CHEST 1 VIEW COMPARISON:  September 18, 2017 FINDINGS: The heart size  and mediastinal contours are within normal limits. There is marked severity calcification of the thoracic aorta. Both lungs are clear. Multiple chronic right-sided rib fractures are seen. Stable postoperative changes are noted within the cervical spine. IMPRESSION: No active disease. Electronically Signed   By: Aram Candela M.D.   On: 12/22/2022 18:29   CT Head Wo Contrast  Result Date: 12/22/2022 CLINICAL DATA:  Confusion EXAM: CT HEAD WITHOUT CONTRAST TECHNIQUE: Contiguous axial images were obtained from the base of the skull through the vertex without intravenous contrast. RADIATION DOSE REDUCTION: This exam was performed according to the departmental dose-optimization program which includes automated exposure control, adjustment of the mA and/or kV according to patient size and/or use of iterative reconstruction technique. COMPARISON:  Head CT 09/05/2021 FINDINGS: Brain: No acute territorial infarction, hemorrhage or intracranial mass. Atrophy and chronic small vessel ischemic changes of the white matter. Small chronic right ganglial capsular infarct with minimal calcification. New focal hypodensity within the right posterior frontal white matter near the vertex, series 8 image 11 through 13. Ventricles are slightly. Vascular: No hyperdense vessels. Vertebral and carotid vascular calcifications. Calcifications in the region of the right MCA as before. Skull: No fracture Sinuses/Orbits: No acute finding. Other: None IMPRESSION: 1. New focal hypodensity within the right posterior frontal white matter near the vertex, indeterminate for acute to subacute infarct or small vessel disease. Consider correlation with MRI. 2. Atrophy and  chronic small vessel ischemic changes of the white matter. Slight interval ventricular enlargement, probably related to atrophy progression. Electronically Signed   By: Jasmine Pang M.D.   On: 12/22/2022 18:17    Principal Problem:   Hyperosmolar hyperglycemic state (HHS)  (HCC) Active Problems:   Acute metabolic encephalopathy   Type 2 diabetes mellitus (HCC)   Acute kidney failure (HCC)   Essential hypertension   Schizophrenia (HCC)     Reisha Wos Orma Flaming, [facility]  If 7PM-7AM, please contact night-coverage [email or web address]   LOS: 2 days

## 2022-12-24 NOTE — Progress Notes (Signed)
VASCULAR LAB    Carotid duplex has been performed.  See CV proc for preliminary results.   Kaliq Lege, RVT 12/24/2022, 11:19 AM

## 2022-12-24 NOTE — Plan of Care (Signed)

## 2022-12-25 DIAGNOSIS — E1100 Type 2 diabetes mellitus with hyperosmolarity without nonketotic hyperglycemic-hyperosmolar coma (NKHHC): Secondary | ICD-10-CM | POA: Diagnosis not present

## 2022-12-25 DIAGNOSIS — I63312 Cerebral infarction due to thrombosis of left middle cerebral artery: Secondary | ICD-10-CM | POA: Diagnosis not present

## 2022-12-25 DIAGNOSIS — N179 Acute kidney failure, unspecified: Secondary | ICD-10-CM | POA: Diagnosis not present

## 2022-12-25 LAB — BASIC METABOLIC PANEL
Anion gap: 7 (ref 5–15)
BUN: 24 mg/dL — ABNORMAL HIGH (ref 8–23)
CO2: 21 mmol/L — ABNORMAL LOW (ref 22–32)
Calcium: 9 mg/dL (ref 8.9–10.3)
Chloride: 109 mmol/L (ref 98–111)
Creatinine, Ser: 1.3 mg/dL — ABNORMAL HIGH (ref 0.44–1.00)
GFR, Estimated: 43 mL/min — ABNORMAL LOW (ref >60)
Glucose, Bld: 86 mg/dL (ref 70–99)
Potassium: 3.6 mmol/L (ref 3.5–5.1)
Sodium: 137 mmol/L (ref 135–145)

## 2022-12-25 LAB — GLUCOSE, CAPILLARY
Glucose-Capillary: 79 mg/dL (ref 70–99)
Glucose-Capillary: 73 mg/dL (ref 70–99)
Glucose-Capillary: 95 mg/dL (ref 70–99)
Glucose-Capillary: 165 mg/dL — ABNORMAL HIGH (ref 70–99)
Glucose-Capillary: 252 mg/dL — ABNORMAL HIGH (ref 70–99)
Glucose-Capillary: 166 mg/dL — ABNORMAL HIGH (ref 70–99)

## 2022-12-25 LAB — CBC
HCT: 31.6 % — ABNORMAL LOW (ref 36.0–46.0)
Hemoglobin: 10.3 g/dL — ABNORMAL LOW (ref 12.0–15.0)
MCH: 28.5 pg (ref 26.0–34.0)
MCHC: 32.6 g/dL (ref 30.0–36.0)
MCV: 87.5 fL (ref 80.0–100.0)
Platelets: 74 10*3/uL — ABNORMAL LOW (ref 150–400)
RBC: 3.61 MIL/uL — ABNORMAL LOW (ref 3.87–5.11)
RDW: 13.2 % (ref 11.5–15.5)
WBC: 7.2 10*3/uL (ref 4.0–10.5)
nRBC: 0 % (ref 0.0–0.2)

## 2022-12-25 LAB — CULTURE, BLOOD (ROUTINE X 2): Culture: NO GROWTH

## 2022-12-25 MED ORDER — VITAMIN B-12 1000 MCG PO TABS
1000.0000 ug | ORAL_TABLET | Freq: Every day | ORAL | Status: DC
  Administered 2022-12-25 – 2022-12-27 (×3): 1000 ug via ORAL
  Filled 2022-12-25 (×3): qty 1

## 2022-12-25 NOTE — Plan of Care (Signed)
  Problem: Education: Goal: Ability to describe self-care measures that may prevent or decrease complications (Diabetes Survival Skills Education) will improve Outcome: Progressing Goal: Individualized Educational Video(s) Outcome: Progressing   

## 2022-12-25 NOTE — Progress Notes (Signed)
PROGRESS NOTE    [name]  ZOX:[id number]  DOB: Apr 07, 1946  DOA: 12/22/2022 PCP: Estevan Oaks, NP Outpatient Specialists:   Hospital course:  77 yo female nursing home resident with the past medical history of T2DM, schizophrenia, depression and CVA who presented with hyperglycemia with a glucose of 1000 mg/dl.  She was lethargic and confused.  Patient's glucose responded well to treatment with careful hydration with normalization of glucose.  Workup for acute metabolic encephalopathy revealed head CT with new focal hypodensity in right posterior frontal white matter.  Patient was seen by neurology who recommended workup for new stroke.  Subjective:  Patient states she is sleepy.  "Did you wake me up just ask how I am?"   Objective: Vitals:   12/25/22 0748 12/25/22 0957 12/25/22 1200 12/25/22 1501  BP: (!) 164/94 (!) 156/84 (!) 142/77 131/70  Pulse: 76 70 71 80  Resp: 18 17 10 17   Temp: 97.9 F (36.6 C) 97.8 F (36.6 C) 98 F (36.7 C) 98.7 F (37.1 C)  TempSrc: Oral Oral Oral Oral  SpO2: 100% 100% 100% 98%  Weight:      Height:        Intake/Output Summary (Last 24 hours) at 12/25/2022 1640 Last data filed at 12/25/2022 1631 Gross per 24 hour  Intake 2189.15 ml  Output 1950 ml  Net 239.15 ml    Filed Weights   12/23/22 0529 12/24/22 0406 12/25/22 0305  Weight: 44.8 kg 45 kg 44.6 kg     Exam:  General: Patient appears brighter when awake, her speech is much less dysarthric and much more intelligible, mental status is improved from yesterday Eyes: sclera anicteric, conjuctiva mild injection bilaterally CVS: S1-S2, regular  Respiratory:  decreased air entry bilaterally secondary to decreased inspiratory effort, rales at bases  GI: NABS, soft, NT  LE: Warm and well-perfused   Data Reviewed:  Basic Metabolic Panel: Recent Labs  Lab 12/22/22 1755 12/22/22 1844 12/23/22 0300 12/23/22 1115 12/23/22 1636 12/24/22 0032 12/25/22 0114  NA  147*   < > 152* 148* 144 143 137  K 4.1   < > 4.0 4.1 4.2 3.8 3.6  CL 115*   < > 120* 118* 115* 111 109  CO2 16*   < > 22 21* 20* 24 21*  GLUCOSE 751*   < > 166* 201* 244* 147* 86  BUN 76*   < > 62* 51* 47* 37* 24*  CREATININE 3.20*   < > 2.56* 2.18* 2.01* 1.74* 1.30*  CALCIUM 10.4*   < > 9.9 9.9 9.7 9.6 9.0  MG 2.5*  --   --   --   --   --   --    < > = values in this interval not displayed.     CBC: Recent Labs  Lab 12/22/22 1755 12/22/22 1844 12/25/22 0114  WBC 14.2*  --  7.2  NEUTROABS 11.0*  --   --   HGB 12.5 13.3 10.3*  HCT 41.9 39.0 31.6*  MCV 93.5  --  87.5  PLT 172  --  74*      Scheduled Meds:  amLODipine  5 mg Oral Daily   aspirin EC  81 mg Oral Daily   benazepril  10 mg Oral Daily   clopidogrel  75 mg Oral Daily   donepezil  10 mg Oral QHS   DULoxetine  60 mg Oral Daily   enoxaparin (LOVENOX) injection  30 mg Subcutaneous Q24H   insulin aspart  0-9 Units Subcutaneous TID WC   insulin glargine-yfgn  6 Units Subcutaneous BID   mirtazapine  15 mg Oral QHS   rosuvastatin  20 mg Oral QHS   traZODone  100 mg Oral QHS   ziprasidone  60 mg Oral QHS   Continuous Infusions:  sodium chloride 75 mL/hr at 12/25/22 1631     Assessment & Plan:   Hyperosmolar hyperglycemia DM2 AKI on CKD Blood glucoses now normalized AKI resolved, creatinine is now at her baseline  insulin drip has been discontinued and patient is on 6 units of glargine with SSI Patient remains on NS at 75, follow ins and outs closely  Acute metabolic encephalopathy CVA Dementia Schizophrenia Patient was unable to tolerate MRA due to head bob and "trying to clot out of head coil" I have asked neurology how important it is for patient to get MRI as she will clearly require sedation  Patient's mental status is improved from yesterday, yesterday patient's daughter said she was very close to baseline Echocardiogram with normal EF Carotid Dopplers are pending Hemoglobin A1c is 13.5--patient  does not appear to have been on anti-glycemic medications as an outpatient She is already on aspirin, Plavix x 21 days ordered LDL is 33 so will not start statin will request PT/OT/SLP Continue donepezil, Geodon/ziprasidone, trazodone, duloxetine and mirtazapine  Anemia Hemoglobin has decreased since admission however patient was clearly very hemoconcentrated and dehydrated on admission. Patient is hemodynamically stable, no evidence of bleeding Follow H&H to ensure stability  HTN Will hold amlodipine and benazepril x 24-hour for permissive hypertension    DVT prophylaxis: Lovenox Code Status: DNR Family Communication: None today, patient's daughter was at bedside yesterday   Studies: VAS US CAROTID  Result Date: 12/24/2022 Carotid Arterial Duplex Study Patient Name:  [name]  Date of Exam:   12/24/2022 Medical Rec #: [id number]           Accession #:    [id number] Date of Birth: [birth year]           Patient Gender: F Patient Age:   30 years Exam Location:  [facility] Procedure:      VAS US CAROTID Referring Phys: Terrilee Files [facility] --------------------------------------------------------------------------------  Indications:       CVA. Risk Factors:      Hypertension, Diabetes, prior CVA. Comparison Study:  No prior study Performing Technologist: Sherren Kerns RVS  Examination Guidelines: A complete evaluation includes B-mode imaging, spectral Doppler, color Doppler, and power Doppler as needed of all accessible portions of each vessel. Bilateral testing is considered an integral part of a complete examination. Limited examinations for reoccurring indications may be performed as noted.  Right Carotid Findings: +----------+--------+--------+--------+----------------------+--------+           PSV cm/sEDV cm/sStenosisPlaque Description    Comments +----------+--------+--------+--------+----------------------+--------+ CCA Prox  43      9               heterogenous                    +----------+--------+--------+--------+----------------------+--------+ CCA Distal43      7               heterogenous                   +----------+--------+--------+--------+----------------------+--------+ ICA Prox  53      14      1-39%   irregular and calcific         +----------+--------+--------+--------+----------------------+--------+ ICA Mid  72      18                                             +----------+--------+--------+--------+----------------------+--------+ ICA Distal58      18                                             +----------+--------+--------+--------+----------------------+--------+ ECA       67      6                                              +----------+--------+--------+--------+----------------------+--------+ +----------+--------+-------+--------+-------------------+           PSV cm/sEDV cmsDescribeArm Pressure (mmHG) +----------+--------+-------+--------+-------------------+ [id number]                                         +----------+--------+-------+--------+-------------------+ +---------+--------+--+--------+-+ VertebralPSV cm/s29EDV cm/s5 +---------+--------+--+--------+-+  Left Carotid Findings: +----------+--------+--------+--------+---------------------+------------------+           PSV cm/sEDV cm/sStenosisPlaque Description   Comments           +----------+--------+--------+--------+---------------------+------------------+ CCA Prox  62      12                                   intimal thickening +----------+--------+--------+--------+---------------------+------------------+ CCA Distal45      8                                    intimal thickening +----------+--------+--------+--------+---------------------+------------------+ ICA Prox  60      14      1-39%   irregular and                                                             calcific                                 +----------+--------+--------+--------+---------------------+------------------+ ICA Mid   73      26                                                      +----------+--------+--------+--------+---------------------+------------------+ ICA Distal51      12                                                      +----------+--------+--------+--------+---------------------+------------------+ ECA  69      15                                                      +----------+--------+--------+--------+---------------------+------------------+ +----------+--------+--------+--------+-------------------+           PSV cm/sEDV cm/sDescribeArm Pressure (mmHG) +----------+--------+--------+--------+-------------------+ [id number]                                          +----------+--------+--------+--------+-------------------+ +---------+--------+--+--------+--+ VertebralPSV cm/s50EDV cm/s17 +---------+--------+--+--------+--+   Summary: Right Carotid: Velocities in the right ICA are consistent with a 1-39% stenosis. Left Carotid: Velocities in the left ICA are consistent with a 1-39% stenosis. Vertebrals:  Bilateral vertebral arteries demonstrate antegrade flow. Subclavians: Normal flow hemodynamics were seen in bilateral subclavian              arteries. *See table(s) above for measurements and observations.     Preliminary    ECHOCARDIOGRAM COMPLETE  Result Date: 12/24/2022    ECHOCARDIOGRAM REPORT   Patient Name:   [name] Date of Exam: 12/24/2022 Medical Rec #:  [id number]          Height:       63.0 in Accession #:    [id number]         Weight:       99.2 lb Date of Birth:  Aug 29, 1945          BSA:          1.435 m Patient Age:    76 years           BP:           131/82 mmHg Patient Gender: F                  HR:           67 bpm. Exam Location:  Inpatient Procedure: 2D Echo, Cardiac Doppler and Color Doppler Indications:    Pulmonary Embolus I26.09   History:        Patient has no prior history of Echocardiogram examinations.                 Risk Factors:Diabetes, Hypertension and Former Smoker.  Sonographer:    Dondra Prader RVT RCS Referring Phys: [id number] [facility] IMPRESSIONS  1. Appears to be narrow LVOT and partial SAM no LVOT gradient noted and no obvious sub valvular web. Left ventricular ejection fraction, by estimation, is 60 to 65%. The left ventricle has normal function. The left ventricle has no regional wall motion abnormalities. There is severe left ventricular hypertrophy. Left ventricular diastolic parameters are consistent with Grade I diastolic dysfunction (impaired relaxation).  2. Right ventricular systolic function is normal. The right ventricular size is normal.  3. The mitral valve is normal in structure. No evidence of mitral valve regurgitation. No evidence of mitral stenosis.  4. The aortic valve is tricuspid. There is moderate calcification of the aortic valve. There is moderate thickening of the aortic valve. Aortic valve regurgitation is mild. Mild aortic valve stenosis.  5. The inferior vena cava is normal in size with greater than 50% respiratory variability, suggesting right atrial pressure of 3 mmHg. FINDINGS  Left Ventricle: Appears to be narrow LVOT and partial SAM no  LVOT gradient noted and no obvious sub valvular web. Left ventricular ejection fraction, by estimation, is 60 to 65%. The left ventricle has normal function. The left ventricle has no regional  wall motion abnormalities. The left ventricular internal cavity size was normal in size. There is severe left ventricular hypertrophy. Left ventricular diastolic parameters are consistent with Grade I diastolic dysfunction (impaired relaxation). Right Ventricle: The right ventricular size is normal. No increase in right ventricular wall thickness. Right ventricular systolic function is normal. Left Atrium: Left atrial size was normal in size. Right Atrium: Right  atrial size was normal in size. Pericardium: There is no evidence of pericardial effusion. Mitral Valve: The mitral valve is normal in structure. No evidence of mitral valve regurgitation. No evidence of mitral valve stenosis. Tricuspid Valve: The tricuspid valve is normal in structure. Tricuspid valve regurgitation is mild . No evidence of tricuspid stenosis. Aortic Valve: The aortic valve is tricuspid. There is moderate calcification of the aortic valve. There is moderate thickening of the aortic valve. Aortic valve regurgitation is mild. Mild aortic stenosis is present. Aortic valve mean gradient measures 3.0 mmHg. Aortic valve peak gradient measures 6.8 mmHg. Aortic valve area, by VTI measures 1.71 cm. Pulmonic Valve: The pulmonic valve was normal in structure. Pulmonic valve regurgitation is mild. No evidence of pulmonic stenosis. Aorta: The aortic root is normal in size and structure. Venous: The inferior vena cava is normal in size with greater than 50% respiratory variability, suggesting right atrial pressure of 3 mmHg. IAS/Shunts: No atrial level shunt detected by color flow Doppler.  LEFT VENTRICLE PLAX 2D LVIDd:         3.50 cm   Diastology LVIDs:         2.20 cm   LV e' medial:    4.53 cm/s LV PW:         1.50 cm   LV E/e' medial:  7.8 LV IVS:        1.70 cm   LV e' lateral:   5.70 cm/s LVOT diam:     1.40 cm   LV E/e' lateral: 6.2 LV SV:         39 LV SV Index:   27 LVOT Area:     1.54 cm  RIGHT VENTRICLE             IVC RV Basal diam:  3.10 cm     IVC diam: 0.90 cm RV Mid diam:    2.80 cm RV S prime:     10.10 cm/s TAPSE (M-mode): 2.4 cm LEFT ATRIUM             Index        RIGHT ATRIUM          Index LA diam:        2.80 cm 1.95 cm/m   RA Area:     8.40 cm LA Vol (A2C):   25.6 ml 17.83 ml/m  RA Volume:   15.20 ml 10.59 ml/m LA Vol (A4C):   20.9 ml 14.56 ml/m LA Biplane Vol: 23.1 ml 16.09 ml/m  AORTIC VALVE                    PULMONIC VALVE AV Area (Vmax):    1.78 cm     PV Vmax:          1.07  m/s AV Area (Vmean):   1.71 cm     PV Peak grad:     4.6 mmHg AV  Area (VTI):     1.71 cm     PR End Diast Vel: 3.88 msec AV Vmax:           130.00 cm/s AV Vmean:          84.100 cm/s AV VTI:            0.227 m AV Peak Grad:      6.8 mmHg AV Mean Grad:      3.0 mmHg LVOT Vmax:         150.00 cm/s LVOT Vmean:        93.400 cm/s LVOT VTI:          0.252 m LVOT/AV VTI ratio: 1.11 AR Vena Contracta: 0.35 cm  AORTA Ao Root diam: 2.50 cm Ao Asc diam:  3.60 cm MITRAL VALVE               TRICUSPID VALVE MV Area (PHT): 3.17 cm    TR Peak grad:   14.0 mmHg MV Decel Time: 239 msec    TR Vmax:        187.00 cm/s MV E velocity: 35.30 cm/s MV A velocity: 59.10 cm/s  SHUNTS MV E/A ratio:  0.60        Systemic VTI:  0.25 m                            Systemic Diam: 1.40 cm Charlton Haws MD Electronically signed by Charlton Haws MD Signature Date/Time: 12/24/2022/11:16:59 AM    Final    MR BRAIN WO CONTRAST  Result Date: 12/23/2022 CLINICAL DATA:  Acute neurologic deficit EXAM: MRI HEAD WITHOUT CONTRAST TECHNIQUE: Multiplanar, multiecho pulse sequences of the brain and surrounding structures were obtained without intravenous contrast. COMPARISON:  01/26/2017 FINDINGS: Brain: There is a small area of subacute ischemia within the left frontal white matter, with no ADC correlate. Area of late subacute to chronic ischemia at the right lentiform nucleus. No acute or chronic hemorrhage. There is multifocal hyperintense T2-weighted signal within the white matter. Generalized volume loss. The midline structures are normal. Vascular: Major flow voids are preserved. Skull and upper cervical spine: Normal calvarium and skull base. Visualized upper cervical spine and soft tissues are normal. Sinuses/Orbits:No paranasal sinus fluid levels or advanced mucosal thickening. No mastoid or middle ear effusion. Normal orbits. IMPRESSION: 1. Small area of subacute ischemia within the left frontal white matter. No hemorrhage or mass effect. 2. Area of  late subacute to chronic ischemia at the right lentiform nucleus. Electronically Signed   By: Deatra Robinson M.D.   On: 12/23/2022 19:11    Principal Problem:   Hyperosmolar hyperglycemic state (HHS) (HCC) Active Problems:   Acute metabolic encephalopathy   Type 2 diabetes mellitus (HCC)   Acute kidney failure (HCC)   Essential hypertension   Schizophrenia (HCC)     Camrin Lapre Orma Flaming, [facility]  If 7PM-7AM, please contact night-coverage [email or web address]   LOS: 3 days

## 2022-12-25 NOTE — Progress Notes (Addendum)
STROKE TEAM PROGRESS NOTE   INTERVAL HISTORY No family at bedside. Exam stable. Pending MRA, carotid ultrasound for stroke workup.   Vitals:   12/25/22 0305 12/25/22 0748 12/25/22 0957 12/25/22 1200  BP: 128/81 (!) 164/94 (!) 156/84 (!) 142/77  Pulse: 62 76 70 71  Resp: 17 18 17 10   Temp: 97.8 F (36.6 C) 97.9 F (36.6 C) 97.8 F (36.6 C) 98 F (36.7 C)  TempSrc: Oral Oral Oral Oral  SpO2: 99% 100% 100% 100%  Weight: 44.6 kg     Height:       CBC:  Recent Labs  Lab 12/22/22 1755 12/22/22 1844 12/25/22 0114  WBC 14.2*  --  7.2  NEUTROABS 11.0*  --   --   HGB 12.5 13.3 10.3*  HCT 41.9 39.0 31.6*  MCV 93.5  --  87.5  PLT 172  --  74*    Basic Metabolic Panel:  Recent Labs  Lab 12/22/22 1755 12/22/22 1844 12/24/22 0032 12/25/22 0114  NA 147*   < > 143 137  K 4.1   < > 3.8 3.6  CL 115*   < > 111 109  CO2 16*   < > 24 21*  GLUCOSE 751*   < > 147* 86  BUN 76*   < > 37* 24*  CREATININE 3.20*   < > 1.74* 1.30*  CALCIUM 10.4*   < > 9.6 9.0  MG 2.5*  --   --   --    < > = values in this interval not displayed.    Lipid Panel:  Recent Labs  Lab 12/24/22 0028  CHOL 104  TRIG 131  HDL 45  CHOLHDL 2.3  VLDL 26  LDLCALC 33    HgbA1c:  Recent Labs  Lab 12/22/22 2221  HGBA1C 13.5*    Urine Drug Screen: No results for input(s): "LABOPIA", "COCAINSCRNUR", "LABBENZ", "AMPHETMU", "THCU", "LABBARB" in the last 168 hours.  Alcohol Level No results for input(s): "ETH" in the last 168 hours.  IMAGING past 24 hours No results found.  PHYSICAL EXAM  Temp:  [97.8 F (36.6 C)-98.9 F (37.2 C)] 98 F (36.7 C) (05/26 1200) Pulse Rate:  [62-78] 71 (05/26 1200) Resp:  [10-18] 10 (05/26 1200) BP: (115-164)/(77-94) 142/77 (05/26 1200) SpO2:  [95 %-100 %] 100 % (05/26 1200) Weight:  [44.6 kg] 44.6 kg (05/26 0305)  General - Well nourished, well developed, in no apparent distress.  Mental Status -  Awake, alert, recognized her daughter, knows that she is in  the hospital but does not know the date. Knows her birth date and month but not year.  Moderate dysarthria present with quiet, low voice.  Cranial Nerves II - XII - II - Visual field intact OU. III, IV, VI - Extraocular movements intact. V - Facial sensation intact bilaterally. VII - Facial movement intact bilaterally. VIII - Hearing & vestibular intact bilaterally. X - Palate elevates symmetrically. XI - Chin turning & shoulder shrug intact bilaterally. XII - Tongue protrusion intact.  Motor Strength - The patient's strength was normal in all extremities and pronator drift was absent.  Bulk was normal and fasciculations were absent.   Motor Tone - Muscle tone was assessed at the neck and appendages and was normal.  Sensory - Light touch were assessed and were symmetrical.    Gait and Station - deferred.   ASSESSMENT/PLAN [name] is a 77 y.o. female with history of DMII, Hypertension, prior stroke presenting with lethargy and confusion  in the setting of hyperglycemia found to have a new left frontal white matter stroke. Stroke etiology likely small vessel disease but cannot rule out large vessel disease. .   Stroke - Left frontal white matter small infarct, likely Small vessel disease  CT Right posterior frontal white matter near the vertex.  Chronic small vessel disease and Atrophy.    MRI  Subacute left frontal white matter infarct MRA  not able to tolerate May consider CTA if Cre improves or TCD  Carotid Doppler unremarkable 2D Echo EF 60-65% LDL 33 HgbA1c 13.5 VTE prophylaxis - Lovenox No antithrombotic prior to admission, now on aspirin and Plavix.  Pending MRA results to determine amount of time for DAPT. Therapy recommendations:  Pending  Disposition:  Pending   Hypertension Home meds: Amlodipine-Benazepril  Stable, Gradually normalize BP in 2-3 days Long-term BP goal normotensive  Hyperlipidemia Home meds:  Crestor 20,resumed in hospital LDL 33,  goal < 70 Continue statin at discharge  Diabetes type II Uncontrolled Home meds:  Metformin HgbA1c 13.5, goal < 7.0 CBGs SSI Recommend close PCP follow-up as A1c is highly elevated while on diabetic medications  AKI Cre 1.74->1.30->pending Close monitoring  Other Stroke Risk Factors  Advanced Age >/= 45  Hx stroke/TIA 2014 with residue dysarthria and dysphagia  Other Active Problems Hx of Schizophrenia/depression, on cymbalta, Remeron, trazadone, geodon Leukocytosis WBC 14.2->7.2  B12 deficiency - continue home B12  Hospital day # 3   Pt seen by Neuro NP/APP and later by MD. Note/plan to be edited by MD as needed.    Lynnae January, DNP, AGACNP-BC [facility] Please use AMION for contact information & EPIC for messaging.  ATTENDING NOTE: I reviewed above note and agree with the assessment and plan. Pt was seen and examined.   No family at the bedside. Pt lying in bed, eyes open, awake alert, seems to have tardive dyskinesia when attempting to talk, resulted in moderate dysarthria and difficult with articulation. However, intact naming repetition, following all simple commands. No focal deficit on exam. Moving all extremities. No facial droop or hemianopia.   MRI showed acute/subacute left frontal WM infarct, likely small vessel disease. Currently pending intracranial vessel imaging, not able to tolerate MRA, will consider CTA in am if Cre improves, otherwise, TCD. Continue DAPT and statin. Will followl  For detailed assessment and plan, please refer to above/below as I have made changes wherever appropriate.   Marvel Plan, MD PhD Stroke Neurology 12/25/2022 5:07 PM    To contact Stroke Continuity provider, please refer to [email or web address]. After hours, contact General Neurology

## 2022-12-25 NOTE — Plan of Care (Signed)
  Problem: Elimination: Goal: Will not experience complications related to bowel motility Outcome: Completed/Met Goal: Will not experience complications related to urinary retention Outcome: Completed/Met   Problem: Pain Managment: Goal: General experience of comfort will improve Outcome: Completed/Met   

## 2022-12-26 ENCOUNTER — Inpatient Hospital Stay (HOSPITAL_COMMUNITY): Payer: Medicare (Managed Care)

## 2022-12-26 DIAGNOSIS — I63312 Cerebral infarction due to thrombosis of left middle cerebral artery: Secondary | ICD-10-CM

## 2022-12-26 DIAGNOSIS — N179 Acute kidney failure, unspecified: Secondary | ICD-10-CM | POA: Diagnosis not present

## 2022-12-26 LAB — CBC
HCT: 31.7 % — ABNORMAL LOW (ref 36.0–46.0)
Hemoglobin: 10.4 g/dL — ABNORMAL LOW (ref 12.0–15.0)
MCH: 29 pg (ref 26.0–34.0)
MCHC: 32.8 g/dL (ref 30.0–36.0)
MCV: 88.3 fL (ref 80.0–100.0)
Platelets: 73 10*3/uL — ABNORMAL LOW (ref 150–400)
RBC: 3.59 MIL/uL — ABNORMAL LOW (ref 3.87–5.11)
RDW: 13.2 % (ref 11.5–15.5)
WBC: 9 10*3/uL (ref 4.0–10.5)
nRBC: 0 % (ref 0.0–0.2)

## 2022-12-26 LAB — GLUCOSE, CAPILLARY
Glucose-Capillary: 183 mg/dL — ABNORMAL HIGH (ref 70–99)
Glucose-Capillary: 189 mg/dL — ABNORMAL HIGH (ref 70–99)
Glucose-Capillary: 168 mg/dL — ABNORMAL HIGH (ref 70–99)
Glucose-Capillary: 181 mg/dL — ABNORMAL HIGH (ref 70–99)

## 2022-12-26 LAB — RENAL FUNCTION PANEL
Albumin: 2.6 g/dL — ABNORMAL LOW (ref 3.5–5.0)
Anion gap: 8 (ref 5–15)
BUN: 22 mg/dL (ref 8–23)
CO2: 21 mmol/L — ABNORMAL LOW (ref 22–32)
Calcium: 8.9 mg/dL (ref 8.9–10.3)
Chloride: 104 mmol/L (ref 98–111)
Creatinine, Ser: 1.25 mg/dL — ABNORMAL HIGH (ref 0.44–1.00)
GFR, Estimated: 45 mL/min — ABNORMAL LOW (ref >60)
Glucose, Bld: 216 mg/dL — ABNORMAL HIGH (ref 70–99)
Phosphorus: 2.1 mg/dL — ABNORMAL LOW (ref 2.5–4.6)
Potassium: 4.3 mmol/L (ref 3.5–5.1)
Sodium: 133 mmol/L — ABNORMAL LOW (ref 135–145)

## 2022-12-26 LAB — CULTURE, BLOOD (ROUTINE X 2)

## 2022-12-26 MED ORDER — DIAZEPAM 2 MG PO TABS
2.0000 mg | ORAL_TABLET | Freq: Once | ORAL | Status: AC
  Administered 2022-12-26: 2 mg via ORAL
  Filled 2022-12-26: qty 1

## 2022-12-26 MED ORDER — IOHEXOL 350 MG/ML SOLN
75.0000 mL | Freq: Once | INTRAVENOUS | Status: AC | PRN
  Administered 2022-12-26: 75 mL via INTRAVENOUS

## 2022-12-26 NOTE — [place] FL2 (Signed)
[place] MEDICAID FL2 LEVEL OF CARE FORM     IDENTIFICATION  Patient Name: [name] Birthdate: 09/03/1945 Sex: female Admission Date (Current Location): 12/22/2022  [facility] and [place] Number:  Producer, television/film/video and Address:  [facility], [address] [place], [place] [address]      Provider Number: [id number]  Attending Physician Name and Address:  Salomon Mast*  Relative Name and Phone Number:       Current Level of Care: Hospital Recommended Level of Care: Skilled Nursing Facility Prior Approval Number:    Date Approved/Denied:   PASRR Number: [id number]  Discharge Plan: SNF    Current Diagnoses: Patient Active Problem List   Diagnosis Date Noted   Essential hypertension 12/23/2022   Hyperosmolar hyperglycemic state (HHS) (HCC) 12/22/2022   Acute metabolic encephalopathy 12/22/2022   Acute kidney failure (HCC) 12/22/2022   Other secondary kyphosis, cervical region 06/09/2016   Schizophrenia (HCC) 04/29/2016   Type 2 diabetes mellitus (HCC) 04/29/2016   Cervical spondylosis with myelopathy 04/01/2016   Myelopathy (HCC) 02/19/2016   Cognitive impairment 02/19/2016   History of alcohol abuse 02/19/2016    Orientation RESPIRATION BLADDER Height & Weight     Self  Normal Incontinent, External catheter Weight: 104 lb 0.9 oz (47.2 kg) Height:  5\' 3"  (160 cm)  BEHAVIORAL SYMPTOMS/MOOD NEUROLOGICAL BOWEL NUTRITION STATUS      Continent Diet (See dc summary)  AMBULATORY STATUS COMMUNICATION OF NEEDS Skin   Extensive Assist Verbally PU Stage and Appropriate Care (Stage II on sacrum)                       Personal Care Assistance Level of Assistance  Bathing, Feeding, Dressing Bathing Assistance: Limited assistance Feeding assistance: Limited assistance Dressing Assistance: Limited assistance     Functional Limitations Info             SPECIAL CARE FACTORS FREQUENCY  PT (By licensed PT), OT (By  licensed OT)     PT Frequency: 3x/week OT Frequency: 3x/week            Contractures Contractures Info: Not present    Additional Factors Info  Code Status, Allergies, Psychotropic, Insulin Sliding Scale Code Status Info: DNR Allergies Info: Soy Allergy, Vitis Viniferae Psychotropic Info: Geodon; Cymbalta Insulin Sliding Scale Info: See dc summary       Current Medications (12/26/2022):  This is the current hospital active medication list Current Facility-Administered Medications  Medication Dose Route Frequency Provider Last Rate Last Admin   0.45 % sodium chloride infusion   Intravenous Continuous Arrien, York Ram, MD 75 mL/hr at 12/26/22 0308 New Bag at 12/26/22 0308   amLODipine (NORVASC) tablet 5 mg  5 mg Oral Daily Arrien, York Ram, MD   5 mg at 12/26/22 0947   aspirin EC tablet 81 mg  81 mg Oral Daily Arrien, York Ram, MD   81 mg at 12/26/22 0947   benazepril (LOTENSIN) tablet 10 mg  10 mg Oral Daily Arrien, York Ram, MD   10 mg at 12/26/22 0948   clopidogrel (PLAVIX) tablet 75 mg  75 mg Oral Daily Leandro Reasoner Tublu, MD   75 mg at 12/26/22 0947   cyanocobalamin (VITAMIN B12) tablet 1,000 mcg  1,000 mcg Oral Daily Marvel Plan, MD   1,000 mcg at 12/26/22 0947   dextrose 50 % solution 0-50 mL  0-50 mL Intravenous PRN Fulton Reek, MD       donepezil (  ARICEPT) tablet 10 mg  10 mg Oral QHS Arrien, York Ram, MD   10 mg at 12/25/22 2158   DULoxetine (CYMBALTA) DR capsule 60 mg  60 mg Oral Daily Arrien, York Ram, MD   60 mg at 12/26/22 0947   enoxaparin (LOVENOX) injection 30 mg  30 mg Subcutaneous Q24H Lyda Perone M, DO   30 mg at 12/26/22 1610   insulin aspart (novoLOG) injection 0-9 Units  0-9 Units Subcutaneous TID WC Arrien, York Ram, MD   2 Units at 12/26/22 1153   insulin glargine-yfgn [facility]) injection 6 Units  6 Units Subcutaneous BID Coralie Keens, MD   6 Units at 12/26/22 0948   mirtazapine  (REMERON) tablet 15 mg  15 mg Oral QHS Coralie Keens, MD   15 mg at 12/25/22 2156   rosuvastatin (CRESTOR) tablet 20 mg  20 mg Oral QHS Coralie Keens, MD   20 mg at 12/25/22 2159   traZODone (DESYREL) tablet 100 mg  100 mg Oral QHS Coralie Keens, MD   100 mg at 12/25/22 2157   ziprasidone (GEODON) capsule 60 mg  60 mg Oral QHS Coralie Keens, MD   60 mg at 12/25/22 2201     Discharge Medications: Please see discharge summary for a list of discharge medications.  Relevant Imaging Results:  Relevant Lab Results:   Additional Information SSN:  [id number]  Renne Crigler Sollie Vultaggio, LCSW

## 2022-12-26 NOTE — Progress Notes (Addendum)
PROGRESS NOTE    SHAILEE Cross  ZOX:[id number]  DOB: 26-Dec-1945  DOA: 12/22/2022 PCP: Estevan Oaks, NP Outpatient Specialists:   Hospital course:  77 yo female nursing home resident with the past medical history of T2DM, schizophrenia, depression and CVA who presented with hyperglycemia with a glucose of 1000 mg/dl.  She was lethargic and confused.  Patient's glucose responded well to treatment with careful hydration with normalization of glucose.  Workup for acute metabolic encephalopathy revealed head CT with new focal hypodensity in right posterior frontal white matter.  Patient was seen by neurology and is undergoing workup for new stroke.  Subjective:  Patient is smiling, says she is fine, in good spirits.   Objective: Vitals:   12/25/22 1938 12/25/22 2347 12/26/22 0346 12/26/22 1144  BP: 128/78 132/71 (!) 150/79 (!) 141/78  Pulse: 78 81 76 73  Resp: 18 19 16 16   Temp: 99.6 F (37.6 C) 98.7 F (37.1 C) 98.1 F (36.7 C)   TempSrc: Oral Oral Oral   SpO2: 100% 99% 95%   Weight:   47.2 kg   Height:        Intake/Output Summary (Last 24 hours) at 12/26/2022 1716 Last data filed at 12/26/2022 1400 Gross per 24 hour  Intake 1994.57 ml  Output 1350 ml  Net 644.57 ml    Filed Weights   12/24/22 0406 12/25/22 0305 12/26/22 0346  Weight: 45 kg 44.6 kg 47.2 kg     Exam:  General: Patient in good spirits, much brighter, in good spirits Eyes: sclera anicteric, conjuctiva mild injection bilaterally CVS: S1-S2, regular  Respiratory:  decreased air entry bilaterally secondary to decreased inspiratory effort, rales at bases  GI: NABS, soft, NT  LE: Warm and well-perfused   Data Reviewed:  Basic Metabolic Panel: Recent Labs  Lab 12/22/22 1755 12/22/22 1844 12/23/22 1115 12/23/22 1636 12/24/22 0032 12/25/22 0114 12/26/22 0048  NA 147*   < > 148* 144 143 137 133*  K 4.1   < > 4.1 4.2 3.8 3.6 4.3  CL 115*   < > 118* 115* 111 109 104  CO2 16*   <  > 21* 20* 24 21* 21*  GLUCOSE 751*   < > 201* 244* 147* 86 216*  BUN 76*   < > 51* 47* 37* 24* 22  CREATININE 3.20*   < > 2.18* 2.01* 1.74* 1.30* 1.25*  CALCIUM 10.4*   < > 9.9 9.7 9.6 9.0 8.9  MG 2.5*  --   --   --   --   --   --   PHOS  --   --   --   --   --   --  2.1*   < > = values in this interval not displayed.     CBC: Recent Labs  Lab 12/22/22 1755 12/22/22 1844 12/25/22 0114 12/26/22 0048  WBC 14.2*  --  7.2 9.0  NEUTROABS 11.0*  --   --   --   HGB 12.5 13.3 10.3* 10.4*  HCT 41.9 39.0 31.6* 31.7*  MCV 93.5  --  87.5 88.3  PLT 172  --  74* 73*      Scheduled Meds:  amLODipine  5 mg Oral Daily   aspirin EC  81 mg Oral Daily   benazepril  10 mg Oral Daily   clopidogrel  75 mg Oral Daily   cyanocobalamin  1,000 mcg Oral Daily   donepezil  10 mg Oral QHS   DULoxetine  60 mg Oral Daily   enoxaparin (LOVENOX) injection  30 mg Subcutaneous Q24H   insulin aspart  0-9 Units Subcutaneous TID WC   insulin glargine-yfgn  6 Units Subcutaneous BID   mirtazapine  15 mg Oral QHS   rosuvastatin  20 mg Oral QHS   traZODone  100 mg Oral QHS   ziprasidone  60 mg Oral QHS   Continuous Infusions:  sodium chloride 75 mL/hr at 12/26/22 0308     Assessment & Plan:   Disposition Patient can be discharged back to her SNF once her stroke workup is completed  Hyperosmolar hyperglycemia DM2 AKI on CKD Developing hyponatremia With sodium dropping, will discontinue half-normal saline, patient with good p.o. intake. Blood glucoses range 162-180 today on glargine 6 units, will not try for tight control Hemoglobin A1c is 13.5--patient does not appear to have been on anti-glycemic medications as an outpatient AKI resolved, creatinine is now at her baseline   Acute metabolic encephalopathy CVA Dementia Schizophrenia Patient's mental status has been improving daily, today she is very likely at baseline, in good spirits and speaking much more easily. Stroke workup ongoing per  neurology, CTA ordered now that creatinine has normalized--patient was unable to tolerate MRA due to agitation. I have given her Valium 2 mg on-call to CT per CT tech request Echocardiogram with normal EF Carotid Dopplers are pending She is already on aspirin, Plavix x 21 days ordered LDL is 33 so will not start statin will request PT/OT/SLP Continue donepezil, Geodon/ziprasidone, trazodone, duloxetine and mirtazapine  Anemia Hemoglobin has decreased since admission however patient was clearly very hemoconcentrated and dehydrated on admission. Patient is hemodynamically stable, no evidence of bleeding, H&H has been stable on repeat  HTN Continue amlodipine and benazepril    DVT prophylaxis: Lovenox Code Status: DNR Family Communication: None today, patient's daughter was at bedside yesterday   Studies: CT ANGIO HEAD NECK W WO CM  Result Date: 12/26/2022 CLINICAL DATA:  Stroke/TIA, determine embolic source. EXAM: CT ANGIOGRAPHY HEAD AND NECK WITH AND WITHOUT CONTRAST TECHNIQUE: Multidetector CT imaging of the head and neck was performed using the standard protocol during bolus administration of intravenous contrast. Multiplanar CT image reconstructions and MIPs were obtained to evaluate the vascular anatomy. Carotid stenosis measurements (when applicable) are obtained utilizing NASCET criteria, using the distal internal carotid diameter as the denominator. RADIATION DOSE REDUCTION: This exam was performed according to the departmental dose-optimization program which includes automated exposure control, adjustment of the mA and/or kV according to patient size and/or use of iterative reconstruction technique. CONTRAST:  75mL OMNIPAQUE IOHEXOL 350 MG/ML SOLN COMPARISON:  Head MRI 12/23/2022 FINDINGS: CT HEAD FINDINGS Brain: A subcentimeter hypodensity is noted in the left centrum semiovale in the area of the known acute infarct which was better shown on MRI. An old infarct is noted in the right  basal ganglia. Patchy hypodensities elsewhere in the cerebral white matter bilaterally are unchanged and nonspecific but compatible with moderate chronic small vessel ischemic disease. There is moderate cerebral atrophy. No acute intracranial hemorrhage, mass, midline shift, or extra-axial fluid collection is identified. Vascular: Calcified atherosclerosis at the skull base. Skull: No acute fracture or suspicious osseous lesion. Sinuses/Orbits: Paranasal sinuses and mastoid air cells are clear. Unremarkable orbits. Other: None. Review of the MIP images confirms the above findings CTA NECK FINDINGS Aortic arch: Standard 3 vessel aortic arch with scattered calcified plaque as well as some soft plaque in the included proximal descending aorta. No significant stenosis of the arch vessel origins.  Right carotid system: Patent with a moderate amount of calcified plaque in the proximal ICA resulting in less than 50% stenosis. Left carotid system: Patent with a large amount of calcified and soft plaque at the carotid bifurcation and in the proximal ICA resulting in less than 50% stenosis. Vertebral arteries: The vertebral arteries are patent with the left being strongly dominant. Extensive venous contrast limits assessment of the V1 and proximal V2 segments. Calcified plaque at the right vertebral origin may result in moderate to severe stenosis, however assessment is limited due to the small size of the hypoplastic vertebral artery and the density of the calcified plaque. Skeleton: C3-C7 ACDF and posterior fusion. Advanced disc and facet degeneration at T1-2. Other neck: No evidence of cervical lymphadenopathy or mass. Upper chest: Asymmetric peribronchial thickening and mild ground-glass opacities in the right lung apex. Review of the MIP images confirms the above findings CTA HEAD FINDINGS Anterior circulation: The internal carotid arteries are patent from skull base to carotid termini with relatively diffuse siphon  calcification bilaterally not resulting in significant stenosis. ACAs and MCAs are patent without evidence of a proximal branch occlusion or significant A1 stenosis. There are moderate bilateral M1 stenoses. No aneurysm is identified. Posterior circulation: The intracranial vertebral arteries are patent to the basilar with atherosclerotic irregularity resulting in up to mild stenosis on the right and a severe distal stenosis on the left. There are suspected severe stenoses of both AICA origins. The basilar artery is patent with mild-to-moderate stenosis proximally. There are robust posterior communicating arteries bilaterally with hypoplasia of the P1 segments as well as a suspected superimposed severe right P1 stenosis. There is also a severe left P2 stenosis. No aneurysm is identified. Venous sinuses: Absent opacification of the left sigmoid sinus and jugular bulb, favored to be secondary to contrast timing and diminished flow in the non dominant left-sided system given the maintenance of a flow void on the recent MRI. Anatomic variants: None. Review of the MIP images confirms the above findings IMPRESSION: 1. No large vessel occlusion. 2. Intracranial atherosclerosis including severe left V4, mild-to-moderate basilar, severe PCA, and moderate bilateral M1 stenoses. 3. Cervical carotid artery atherosclerosis without significant stenosis. 4. Asymmetric peribronchial thickening and mild ground-glass opacities in the right lung apex, likely infectious or inflammatory. 5.  Aortic Atherosclerosis (ICD10-I70.0). Electronically Signed   By: Sebastian Ache M.D.   On: 12/26/2022 16:41    Principal Problem:   Hyperosmolar hyperglycemic state (HHS) (HCC) Active Problems:   Acute metabolic encephalopathy   Type 2 diabetes mellitus (HCC)   Acute kidney failure (HCC)   Essential hypertension   Schizophrenia (HCC)     Joe Gee Orma Flaming, [facility]  If 7PM-7AM, please contact  night-coverage [email or web address]   LOS: 4 days

## 2022-12-26 NOTE — Progress Notes (Addendum)
STROKE TEAM PROGRESS NOTE   INTERVAL HISTORY No family at bedside.  No acute event overnight, neuro stable, unchanged.  CTA head and neck done, no LVO but with multifocal intracranial stenosis.   Vitals:   12/25/22 1938 12/25/22 2347 12/26/22 0346 12/26/22 1144  BP: 128/78 132/71 (!) 150/79 (!) 141/78  Pulse: 78 81 76 73  Resp: 18 19 16 16   Temp: 99.6 F (37.6 C) 98.7 F (37.1 C) 98.1 F (36.7 C)   TempSrc: Oral Oral Oral   SpO2: 100% 99% 95%   Weight:   47.2 kg   Height:       CBC:  Recent Labs  Lab 12/22/22 1755 12/22/22 1844 12/25/22 0114 12/26/22 0048  WBC 14.2*  --  7.2 9.0  NEUTROABS 11.0*  --   --   --   HGB 12.5   < > 10.3* 10.4*  HCT 41.9   < > 31.6* 31.7*  MCV 93.5  --  87.5 88.3  PLT 172  --  74* 73*   < > = values in this interval not displayed.   Basic Metabolic Panel:  Recent Labs  Lab 12/22/22 1755 12/22/22 1844 12/25/22 0114 12/26/22 0048  NA 147*   < > 137 133*  K 4.1   < > 3.6 4.3  CL 115*   < > 109 104  CO2 16*   < > 21* 21*  GLUCOSE 751*   < > 86 216*  BUN 76*   < > 24* 22  CREATININE 3.20*   < > 1.30* 1.25*  CALCIUM 10.4*   < > 9.0 8.9  MG 2.5*  --   --   --   PHOS  --   --   --  2.1*   < > = values in this interval not displayed.   Lipid Panel:  Recent Labs  Lab 12/24/22 0028  CHOL 104  TRIG 131  HDL 45  CHOLHDL 2.3  VLDL 26  LDLCALC 33   HgbA1c:  Recent Labs  Lab 12/22/22 2221  HGBA1C 13.5*   Urine Drug Screen: No results for input(s): "LABOPIA", "COCAINSCRNUR", "LABBENZ", "AMPHETMU", "THCU", "LABBARB" in the last 168 hours.  Alcohol Level No results for input(s): "ETH" in the last 168 hours.  IMAGING past 24 hours CT ANGIO HEAD NECK W WO CM  Result Date: 12/26/2022 CLINICAL DATA:  Stroke/TIA, determine embolic source. EXAM: CT ANGIOGRAPHY HEAD AND NECK WITH AND WITHOUT CONTRAST TECHNIQUE: Multidetector CT imaging of the head and neck was performed using the standard protocol during bolus administration of intravenous  contrast. Multiplanar CT image reconstructions and MIPs were obtained to evaluate the vascular anatomy. Carotid stenosis measurements (when applicable) are obtained utilizing NASCET criteria, using the distal internal carotid diameter as the denominator. RADIATION DOSE REDUCTION: This exam was performed according to the departmental dose-optimization program which includes automated exposure control, adjustment of the mA and/or kV according to patient size and/or use of iterative reconstruction technique. CONTRAST:  75mL OMNIPAQUE IOHEXOL 350 MG/ML SOLN COMPARISON:  Head MRI 12/23/2022 FINDINGS: CT HEAD FINDINGS Brain: A subcentimeter hypodensity is noted in the left centrum semiovale in the area of the known acute infarct which was better shown on MRI. An old infarct is noted in the right basal ganglia. Patchy hypodensities elsewhere in the cerebral white matter bilaterally are unchanged and nonspecific but compatible with moderate chronic small vessel ischemic disease. There is moderate cerebral atrophy. No acute intracranial hemorrhage, mass, midline shift, or extra-axial fluid collection is identified.  Vascular: Calcified atherosclerosis at the skull base. Skull: No acute fracture or suspicious osseous lesion. Sinuses/Orbits: Paranasal sinuses and mastoid air cells are clear. Unremarkable orbits. Other: None. Review of the MIP images confirms the above findings CTA NECK FINDINGS Aortic arch: Standard 3 vessel aortic arch with scattered calcified plaque as well as some soft plaque in the included proximal descending aorta. No significant stenosis of the arch vessel origins. Right carotid system: Patent with a moderate amount of calcified plaque in the proximal ICA resulting in less than 50% stenosis. Left carotid system: Patent with a large amount of calcified and soft plaque at the carotid bifurcation and in the proximal ICA resulting in less than 50% stenosis. Vertebral arteries: The vertebral arteries are  patent with the left being strongly dominant. Extensive venous contrast limits assessment of the V1 and proximal V2 segments. Calcified plaque at the right vertebral origin may result in moderate to severe stenosis, however assessment is limited due to the small size of the hypoplastic vertebral artery and the density of the calcified plaque. Skeleton: C3-C7 ACDF and posterior fusion. Advanced disc and facet degeneration at T1-2. Other neck: No evidence of cervical lymphadenopathy or mass. Upper chest: Asymmetric peribronchial thickening and mild ground-glass opacities in the right lung apex. Review of the MIP images confirms the above findings CTA HEAD FINDINGS Anterior circulation: The internal carotid arteries are patent from skull base to carotid termini with relatively diffuse siphon calcification bilaterally not resulting in significant stenosis. ACAs and MCAs are patent without evidence of a proximal branch occlusion or significant A1 stenosis. There are moderate bilateral M1 stenoses. No aneurysm is identified. Posterior circulation: The intracranial vertebral arteries are patent to the basilar with atherosclerotic irregularity resulting in up to mild stenosis on the right and a severe distal stenosis on the left. There are suspected severe stenoses of both AICA origins. The basilar artery is patent with mild-to-moderate stenosis proximally. There are robust posterior communicating arteries bilaterally with hypoplasia of the P1 segments as well as a suspected superimposed severe right P1 stenosis. There is also a severe left P2 stenosis. No aneurysm is identified. Venous sinuses: Absent opacification of the left sigmoid sinus and jugular bulb, favored to be secondary to contrast timing and diminished flow in the non dominant left-sided system given the maintenance of a flow void on the recent MRI. Anatomic variants: None. Review of the MIP images confirms the above findings IMPRESSION: 1. No large vessel  occlusion. 2. Intracranial atherosclerosis including severe left V4, mild-to-moderate basilar, severe PCA, and moderate bilateral M1 stenoses. 3. Cervical carotid artery atherosclerosis without significant stenosis. 4. Asymmetric peribronchial thickening and mild ground-glass opacities in the right lung apex, likely infectious or inflammatory. 5.  Aortic Atherosclerosis (ICD10-I70.0). Electronically Signed   By: Sebastian Ache M.D.   On: 12/26/2022 16:41    PHYSICAL EXAM  Temp:  [98.1 F (36.7 C)-99.6 F (37.6 C)] 98.1 F (36.7 C) (05/27 0346) Pulse Rate:  [73-81] 73 (05/27 1144) Resp:  [16-19] 16 (05/27 1144) BP: (128-150)/(71-79) 141/78 (05/27 1144) SpO2:  [95 %-100 %] 95 % (05/27 0346) Weight:  [47.2 kg] 47.2 kg (05/27 0346)  General - Well nourished, well developed, in no apparent distress.  Mental Status -  eyes open, awake alert, seems to have tardive dyskinesia when attempting to talk, resulted in moderate dysarthria and difficult with articulation. However, intact naming repetition, following all simple commands   Cranial Nerves II - XII - II - Visual field intact OU. III, IV, VI -  Extraocular movements intact. V - Facial sensation intact bilaterally. VII - Facial movement intact bilaterally. VIII - Hearing & vestibular intact bilaterally. X - Palate elevates symmetrically. XI - Chin turning & shoulder shrug intact bilaterally. XII - Tongue protrusion intact.  Motor Strength - The patient's strength was normal in all extremities and pronator drift was absent.  Bulk was normal and fasciculations were absent.   Motor Tone - Muscle tone was assessed at the neck and appendages and was normal.  Sensory - Light touch were assessed and were symmetrical.    Gait and Station - deferred.   ASSESSMENT/PLAN [name] is a 77 y.o. female with history of DMII, Hypertension, prior stroke presenting with lethargy and confusion in the setting of hyperglycemia found to have a  new left frontal white matter stroke. Stroke etiology likely small vessel disease but cannot rule out large vessel disease. .   Stroke - Left frontal white matter small infarct, likely small vessel disease  CT Right posterior frontal white matter near the vertex.  Chronic small vessel disease and Atrophy.    MRI  Subacute left frontal white matter infarct MRA  not able to tolerate CTA head and neck Intracranial atherosclerosis including severe left V4, mild-to-moderate basilar, severe PCA, and moderate bilateral M1 stenoses. Carotid Doppler unremarkable 2D Echo EF 60-65% LDL 33 HgbA1c 13.5 VTE prophylaxis - Lovenox No antithrombotic prior to admission, now on aspirin and Plavix DAPT for 3 weeks and then aspirin alone. Therapy recommendations:  Pending  Disposition:  Pending   Hypertension Home meds: Amlodipine-Benazepril  Stable, Gradually normalize BP in 2-3 days Long-term BP goal normotensive  Hyperlipidemia Home meds:  Crestor 20,resumed in hospital LDL 33, goal < 70 Continue statin at discharge  Diabetes type II Uncontrolled Home meds:  Metformin HgbA1c 13.5, goal < 7.0 CBGs SSI Recommend close PCP follow-up as A1c is highly elevated while on diabetic medications  AKI Cre 1.74->1.30-> 1.25 Close monitoring  Other Stroke Risk Factors  Advanced Age >/= 52  Hx stroke/TIA 2014 with residue dysarthria and dysphagia  Other Active Problems Hx of Schizophrenia/depression, on cymbalta, Remeron, trazadone, geodon Leukocytosis WBC 14.2->7.2  B12 deficiency - continue home B12  Hospital day # 4  Neurology will sign off. Please call with questions. Pt will follow up with stroke clinic NP at [facility] in about 4 weeks. Thanks for the consult.   Marvel Plan, MD PhD Stroke Neurology 12/26/2022 4:58 PM    To contact Stroke Continuity provider, please refer to [email or web address]. After hours, contact General Neurology

## 2022-12-27 DIAGNOSIS — E1100 Type 2 diabetes mellitus with hyperosmolarity without nonketotic hyperglycemic-hyperosmolar coma (NKHHC): Secondary | ICD-10-CM | POA: Diagnosis not present

## 2022-12-27 LAB — GLUCOSE, CAPILLARY
Glucose-Capillary: 121 mg/dL — ABNORMAL HIGH (ref 70–99)
Glucose-Capillary: 165 mg/dL — ABNORMAL HIGH (ref 70–99)

## 2022-12-27 LAB — CULTURE, BLOOD (ROUTINE X 2): Special Requests: ADEQUATE

## 2022-12-27 MED ORDER — CLOPIDOGREL BISULFATE 75 MG PO TABS: 75.0000 mg | ORAL_TABLET | Freq: Every day | ORAL | 0 refills | Status: AC

## 2022-12-27 MED ORDER — LANTUS SOLOSTAR 100 UNIT/ML ~~LOC~~ SOPN: 10.0000 [IU] | PEN_INJECTOR | Freq: Every day | SUBCUTANEOUS | 11 refills | Status: AC

## 2022-12-27 NOTE — Evaluation (Signed)
Occupational Therapy Evaluation Patient Details Name: [name] MRN: [id number] DOB: 07/03/1946 Today's Date: 12/27/2022   History of Present Illness Patient is a 77 y/o female admitted 12/22/22 with confusion, lethargy and found to be hyperglycemic with glucose of 1000 mg/dl.  Also found to have new frontal white matter stroke. PMH positive for schizophrenia, DM2, depression and prior CVA.   Clinical Impression   Pt questionable historian, per pt was not receiving rehab services at [facility], mobilized with w/c, reports was independent with ADLs and only had assist for IADLs. Pt currently needing mod-total A for ADLs, mod A for bed mobility, pt poor sitting  balance with anterior lean. Noted R horizontal nystagmus with rolling to pt's L side to sit EOB, pt denies dizziness. Pt presenting with impairments listed below, will follow acutely. Patient will benefit from continued inpatient follow up therapy, <3 hours/day to maximize safety and independence with ADLs/functional mobility.        Recommendations for follow up therapy are one component of a multi-disciplinary discharge planning process, led by the attending physician.  Recommendations may be updated based on patient status, additional functional criteria and insurance authorization.   Assistance Recommended at Discharge Frequent or constant Supervision/Assistance  Patient can return home with the following Two people to help with walking and/or transfers;A lot of help with bathing/dressing/bathroom;Assistance with cooking/housework;Assistance with feeding;Direct supervision/assist for financial management;Direct supervision/assist for medications management;Assist for transportation;Help with stairs or ramp for entrance    Functional Status Assessment  Patient has had a recent decline in their functional status and demonstrates the ability to make significant improvements in function in a reasonable and predictable amount of  time.  Equipment Recommendations  Other (comment) (defer)    Recommendations for Other Services PT consult     Precautions / Restrictions Precautions Precautions: Fall Restrictions Weight Bearing Restrictions: No      Mobility Bed Mobility Overal bed mobility: Needs Assistance Bed Mobility: Rolling, Supine to Sit, Sit to Supine     Supine to sit: Mod assist          Transfers                   General transfer comment: deferred due to poor sitting balance      Balance Overall balance assessment: Needs assistance Sitting-balance support: Feet supported, Single extremity supported Sitting balance-Leahy Scale: Poor Sitting balance - Comments: anterior lean when sitting EOB, needs unilateral UE support       Standing balance comment: deferred                           ADL either performed or assessed with clinical judgement   ADL Overall ADL's : Needs assistance/impaired Eating/Feeding: Moderate assistance;Bed level   Grooming: Moderate assistance   Upper Body Bathing: Moderate assistance   Lower Body Bathing: Maximal assistance   Upper Body Dressing : Moderate assistance   Lower Body Dressing: Maximal assistance   Toilet Transfer: Maximal assistance;+2 for physical assistance   Toileting- Clothing Manipulation and Hygiene: Total assistance       Functional mobility during ADLs: Maximal assistance;+2 for physical assistance General ADL Comments: appears baseline     Vision Baseline Vision/History: 1 Wears glasses Vision Assessment?: Yes Additional Comments: appears to prefer looking ot R, R horizontal nystagmus noted when rolling to L to get OOB     Perception Perception Perception Tested?: No   Praxis Praxis Praxis tested?: Not tested  Pertinent Vitals/Pain Pain Assessment Pain Assessment: No/denies pain     Hand Dominance Right   Extremity/Trunk Assessment Upper Extremity Assessment Upper Extremity Assessment:  Generalized weakness   Lower Extremity Assessment Lower Extremity Assessment: Defer to PT evaluation   Cervical / Trunk Assessment Cervical / Trunk Assessment: Kyphotic   Communication Communication Communication: Expressive difficulties (dysarthric)   Cognition Arousal/Alertness: Awake/alert Behavior During Therapy: WFL for tasks assessed/performed Overall Cognitive Status: No family/caregiver present to determine baseline cognitive functioning                                 General Comments: oriented to self only, follows commands with increased itme     General Comments  VSS on RA    Exercises     Shoulder Instructions      Home Living Family/patient expects to be discharged to:: Skilled nursing facility                                 Additional Comments: At [facility] prior to admission      Prior Functioning/Environment Prior Level of Function : Needs assist             Mobility Comments: reports use of RW and w/c ADLs Comments: pt reports no assist, howeve presume assist        OT Problem List: Decreased strength;Decreased range of motion;Decreased activity tolerance;Impaired balance (sitting and/or standing);Decreased cognition;Decreased safety awareness;Impaired UE functional use;Decreased coordination;Impaired vision/perception      OT Treatment/Interventions: Self-care/ADL training;Therapeutic exercise;Neuromuscular education;Energy conservation;DME and/or AE instruction;Therapeutic activities;Patient/family education;Balance training;Cognitive remediation/compensation;Visual/perceptual remediation/compensation    OT Goals(Current goals can be found in the care plan section) Acute Rehab OT Goals Patient Stated Goal: none stated OT Goal Formulation: With patient Time For Goal Achievement: 01/10/23 Potential to Achieve Goals: Good  OT Frequency: Min 1X/week    Co-evaluation              AM-PAC OT "6 Clicks" Daily  Activity     Outcome Measure Help from another person eating meals?: A Little Help from another person taking care of personal grooming?: A Lot Help from another person toileting, which includes using toliet, bedpan, or urinal?: A Lot Help from another person bathing (including washing, rinsing, drying)?: A Lot Help from another person to put on and taking off regular upper body clothing?: A Lot Help from another person to put on and taking off regular lower body clothing?: A Lot 6 Click Score: 13   End of Session Nurse Communication: Mobility status  Activity Tolerance: Patient limited by fatigue Patient left: in bed;with call bell/phone within reach;with bed alarm set  OT Visit Diagnosis: Unsteadiness on feet (R26.81);Other abnormalities of gait and mobility (R26.89);Muscle weakness (generalized) (M62.81)                Time: 1610-9604 OT Time Calculation (min): 18 min Charges:  OT General Charges $OT Visit: 1 Visit OT Evaluation $OT Eval Moderate Complexity: 1 Mod  Lashun Ramseyer K, OTD, OTR/L SecureChat Preferred Acute Rehab [phone]  Carver Fila Koonce 12/27/2022, 12:31 PM

## 2022-12-27 NOTE — Evaluation (Signed)
Physical Therapy Evaluation Patient Details Name: [name] MRN: [id number] DOB: 1945-08-20 Today's Date: 12/27/2022  History of Present Illness  Patient is a 77 y/o female admitted 12/22/22 with confusion, lethargy and found to be hyperglycemic with glucose of 1000 mg/dl.  Also found to have new frontal white matter stroke. PMH positive for schizophrenia, DM2, depression and prior CVA.  Clinical Impression  Patient presents with decreased mobility due to generalized weakness and decreased activity tolerance.  Found soiled in bed so assisted with hygiene and up OOB with mod to max A stand pivot to recliner for bed change.  Patient with unknown prior functional status, but she does report going to the "gym" at [facility] and that was easier for her to sit herself up PTA.  Feel she will benefit from continued skilled PT in the acute setting and from follow up PT at SNF.       Recommendations for follow up therapy are one component of a multi-disciplinary discharge planning process, led by the attending physician.  Recommendations may be updated based on patient status, additional functional criteria and insurance authorization.  Follow Up Recommendations       Assistance Recommended at Discharge Frequent or constant Supervision/Assistance  Patient can return home with the following  Two people to help with walking and/or transfers;Assistance with cooking/housework;Direct supervision/assist for medications management;Assist for transportation;Help with stairs or ramp for entrance    Equipment Recommendations None recommended by PT  Recommendations for Other Services       Functional Status Assessment Patient has had a recent decline in their functional status and demonstrates the ability to make significant improvements in function in a reasonable and predictable amount of time.     Precautions / Restrictions Precautions Precautions: Fall Restrictions Weight Bearing Restrictions: No       Mobility  Bed Mobility Overal bed mobility: Needs Assistance Bed Mobility: Rolling, Supine to Sit, Sit to Supine Rolling: Min assist   Supine to sit: Mod assist Sit to supine: Min assist   General bed mobility comments: rolling in bed for hygiene as soiled with urine and feces; assist to EOB lifting trunk with cues, to supine assist for positioning.    Transfers Overall transfer level: Needs assistance   Transfers: Bed to chair/wheelchair/BSC   Stand pivot transfers: Max assist         General transfer comment: lifting help and assist to pivot hips to chair    Ambulation/Gait               General Gait Details: unable  Stairs            Wheelchair Mobility    Modified Rankin (Stroke Patients Only) Modified Rankin (Stroke Patients Only) Pre-Morbid Rankin Score: Severe disability Modified Rankin: Severe disability     Balance Overall balance assessment: Needs assistance Sitting-balance support: Feet supported Sitting balance-Leahy Scale: Fair Sitting balance - Comments: can sit EOB with S hand in her lap     Standing balance-Leahy Scale: Zero Standing balance comment: max A for standing                             Pertinent Vitals/Pain Pain Assessment Pain Assessment: No/denies pain    Home Living Family/patient expects to be discharged to:: Skilled nursing facility                   Additional Comments: At [facility] prior to admission  Prior Function Prior Level of Function : Needs assist             Mobility Comments: reports use of RW and w/c ADLs Comments: pt reports no assist, howeve presume assist     Hand Dominance   Dominant Hand: Right    Extremity/Trunk Assessment   Upper Extremity Assessment Upper Extremity Assessment: Generalized weakness    Lower Extremity Assessment Lower Extremity Assessment: Defer to PT evaluation    Cervical / Trunk Assessment Cervical / Trunk Assessment:  Kyphotic  Communication   Communication: Expressive difficulties (dysarthric)  Cognition Arousal/Alertness: Awake/alert Behavior During Therapy: WFL for tasks assessed/performed Overall Cognitive Status: No family/caregiver present to determine baseline cognitive functioning                                 General Comments: disoriented to situation and time (states November 1967, but knows she is at the hospital); unknown baseline        General Comments General comments (skin integrity, edema, etc.): VSS on RA    Exercises     Assessment/Plan    PT Assessment Patient needs continued PT services  PT Problem List Decreased strength;Decreased balance;Decreased mobility;Decreased safety awareness;Decreased knowledge of use of DME       PT Treatment Interventions DME instruction;Functional mobility training;Balance training;Patient/family education;Therapeutic activities;Therapeutic exercise;Neuromuscular re-education    PT Goals (Current goals can be found in the Care Plan section)  Acute Rehab PT Goals Patient Stated Goal: to get better at getting up PT Goal Formulation: With patient Time For Goal Achievement: 01/10/23 Potential to Achieve Goals: Fair    Frequency Min 3X/week     Co-evaluation               AM-PAC PT "6 Clicks" Mobility  Outcome Measure Help needed turning from your back to your side while in a flat bed without using bedrails?: A Lot Help needed moving from lying on your back to sitting on the side of a flat bed without using bedrails?: A Lot Help needed moving to and from a bed to a chair (including a wheelchair)?: Total Help needed standing up from a chair using your arms (e.g., wheelchair or bedside chair)?: Total Help needed to walk in hospital room?: Total Help needed climbing 3-5 steps with a railing? : Total 6 Click Score: 8    End of Session Equipment Utilized During Treatment: Gait belt Activity Tolerance: Patient limited  by fatigue Patient left: with call bell/phone within reach;with bed alarm set;in bed Nurse Communication: Mobility status PT Visit Diagnosis: Other abnormalities of gait and mobility (R26.89);Muscle weakness (generalized) (M62.81)    Time: 1610-9604 PT Time Calculation (min) (ACUTE ONLY): 35 min   Charges:   PT Evaluation $PT Eval Moderate Complexity: 1 Mod PT Treatments $Therapeutic Activity: 8-22 mins        Sheran Lawless, PT Acute Rehabilitation Services Office:[phone] 12/27/2022   Elray Mcgregor 12/27/2022, 12:16 PM

## 2022-12-27 NOTE — Care Management Important Message (Signed)
Important Message  Patient Details  Name: [name] MRN: [id number] Date of Birth: 07/30/1946   Medicare Important Message Given:  Yes     [name] 12/27/2022, 9:26 AM

## 2022-12-27 NOTE — TOC Transition Note (Signed)
Transition of Care [facility]) - CM/SW Discharge Note   Patient Details  Name: SHUNTA HECKSTALL MRN: [id number] Date of Birth: [birth year]  [facility] CM/SW Contact:  Leander Rams, LCSW Phone Number: 12/27/2022, 2:40 PM   Clinical Narrative:    Patient will DC to: [facility] Anticipated DC date: 12/27/2022 Family notified: Attempted to reach daughter Transport by: [facility]   Per MD patient ready for DC to [facility]. RN, patient, patient's family, and facility notified of DC. Discharge Summary and FL2 sent to facility. RN to call report prior to discharge [phone]. DC packet on chart. Ambulance transport requested for patient.   CSW will sign off for now as social work intervention is no longer needed. Please consult [place] again if new needs arise.    Final next level of care: Skilled Nursing Facility Barriers to Discharge: No Barriers Identified   Patient Goals and CMS Choice      Discharge Placement                Patient chooses bed at: [facility] Patient to be transferred to facility by: [facility] Name of family member notified: Attempted to reach daughter Patient and family notified of of transfer: 12/27/22  Discharge Plan and Services Additional resources added to the After Visit Summary for   In-house Referral: Clinical Social Work                                   Social Determinants of Health (SDOH) Interventions SDOH Screenings   Tobacco Use: Medium Risk (10/08/2021)     Readmission Risk Interventions     No data to display          Oletta Lamas, MSW, LCSWA, LCASA Transitions of Care  Clinical Social Worker I

## 2022-12-27 NOTE — Discharge Summary (Signed)
Physician Discharge Summary   Patient: [name] MRN: [id number] DOB: 1946-01-10  Admit date:     12/22/2022  Discharge date: 12/27/22  Discharge Physician: [name]   PCP: Robin Oaks, NP   Recommendations at discharge:  Please obtain CBC and BMP in 1 week Follow-up with outpatient neurology Patient was admitted with hyperosmolar hyperglycemia, she has been started on 10 units of Lantus daily, please monitor and titrate accordingly Follow-up with primary care provider  Discharge Diagnoses: Principal Problem:   Hyperosmolar hyperglycemic state (HHS) (HCC) Active Problems:   Acute metabolic encephalopathy   Type 2 diabetes mellitus (HCC)   Acute kidney failure (HCC)   Essential hypertension   Schizophrenia [facility])   Hospital Course: [name] was admitted to the hospital with the working diagnosis of hyperosmolar non ketotic state.   77 yo female nursing home resident with the past medical history of T2DM, schizophrenia, depression and CVA who presented with hyperglycemia. Patient was found to have a glucose of 1000 mg/dl, she was noted to be more lethargic and confused compared to her baseline.  She received subcutaneous insulin short acting and long acting along with IV fluids. She was transferred to the ED for further evaluation. On her initial physical examination her blood pressure was 112/82, HR 79, RR 19 and 02 saturation 97%, lungs with no wheezing or rales, heart with S1 and S2 present and rhythmic, abdomen sof and non tender, no lower extremity edema. She was somnolent but easy to arouse, positive confusion, but able to answer simple questions and follow simple commands.   VBG 7.32/ 37/ 64/ 21/ 90%  Na 147, K 4,1 Cl 115 bicarbonate 16, glucose 749m bun 76, cr 3,20  Anion gap 16. Mg 2,5  Lactic acid 4,0, 4,4 4,0 and 2,7  Wbc 14.2 hgb 12.5 plt 172    Head CT with new focal hypodensity within the right posterior frontal white matter near the vertex,  indeterminate for acute to subacute infarct or small vessel disease.  Atrophy and chronic small vessel ischemic changes of the white matter.  MRI brain with a small area of subacute ischemia within the left frontal white matter.  No hemorrhage or mass effect.  Also had a late subacute to chronic ischemia at the right lentiform nucleus.  Chest radiograph with no infiltrates or effusions. Mild hyperinflation.   EKG 77 bpm, normal axis, manually qtc 538, sinus rhythm with no significant ST segment or T wave changes.   Patient was found to have hemoglobin 1 A1c of 13.5.  She apparently stopped taking her metformin.  She is being discharged on 10 units of Lantus and need to have a dose titration by PCP.  She can resume her metformin.  She was also found to have a subacute CVA.  Neurology was consulted and she completed stroke workup.  She will take aspirin and Plavix together for 21 days followed by aspirin only. Ultrasound carotid was negative for any large vessel stenosis.  Patient is appear to be back to her baseline.  Our PT and OT are recommending rehab.  Patient is a long-term resident at a nursing facility and will continue rehab over there.    Assessment and Plan: * Hyperosmolar hyperglycemic state (HHS) [facility]) Patient had mild DKA on admission down now has resolved.  Diet has been advanced and patient has been transitioned to sq insulin.  Approximately having 1 unit per hr on insulin drip, will transition to long acting insulin 6 units bis and add  correction insulin sliding scale with short acting insulin. Patient is naive to insulin therapy.    Acute metabolic encephalopathy Multifactorial encephalopathy, likely to hyperosmolar state. CT head with possible CVA and MRI confirmed it. A1c of 13.5, carotid ultrasound without any significant stenosis Completed the stroke workup with neurology recommendations of DAPT with aspirin and Plavix for 3 weeks followed by aspirin only.  Patient will  get rehab at her facility  Acute kidney failure [facility]) Resolved  Type 2 diabetes mellitus (HCC) At home patient has been taken off metformin.  Patient will need insulin therapy to control hyperglycemia.   Essential hypertension Resume blood pressure control with amlodipine and benazepril.   Schizophrenia (HCC) Resume donepezil, duloxetine, and mirtazapine.  Patient on trazodone and ziprasidone.       Consultants: Neurology Procedures performed: None Disposition: Skilled nursing facility Diet recommendation:  Discharge Diet Orders (From admission, onward)     Start     Ordered   12/27/22 0000  Diet - low sodium heart healthy        12/27/22 1206           Cardiac and Carb modified diet DISCHARGE MEDICATION: Allergies as of 12/27/2022       Reactions   Soy Allergy Other (See Comments)   Unknown reaction per MAR   Vitis Viniferae Other (See Comments)   Grapes and raisins - unknown reaction per [facility]        Medication List     TAKE these medications    acetaminophen 500 MG tablet Commonly known as: TYLENOL Take 500 mg by mouth in the morning and at bedtime. for shoulder pain   amLODipine-benazepril 5-10 MG capsule Commonly known as: LOTREL Take 1 capsule by mouth daily.   aspirin EC 81 MG tablet Take 81 mg by mouth daily.   clopidogrel 75 MG tablet Commonly known as: PLAVIX Take 1 tablet (75 mg total) by mouth daily for 21 days. Start taking on: Dec 28, 2022   cyanocobalamin 1000 MCG tablet Take 1,000 mcg by mouth daily.   dextromethorphan-guaiFENesin 10-100 MG/5ML liquid Commonly known as: ROBITUSSIN-DM Take 10 mLs by mouth every 6 (six) hours as needed for cough.   donepezil 10 MG tablet Commonly known as: ARICEPT Take 10 mg by mouth at bedtime.   DULoxetine 60 MG capsule Commonly known as: CYMBALTA Take 60 mg by mouth daily.   Ensure Take 237 mLs by mouth 3 (three) times daily.   Lantus SoloStar 100 UNIT/ML Solostar Pen Generic drug:  insulin glargine Inject 10 Units into the skin daily.   Menthol (Topical Analgesic) 4 % Gel Apply 1 application  topically in the morning and at bedtime. Apply to right shoulder for pain   MENTHOL-ZINC OXIDE EX Apply 1 application  topically in the morning and at bedtime. Apply to sacrum and gluteal folds   metFORMIN 500 MG 24 hr tablet Commonly known as: GLUCOPHAGE-XR Take 750 mg by mouth in the morning and at bedtime.   mirtazapine 15 MG tablet Commonly known as: REMERON Take 15 mg by mouth at bedtime.   rosuvastatin 20 MG tablet Commonly known as: CRESTOR Take 20 mg by mouth at bedtime.   senna 8.6 MG Tabs tablet Commonly known as: SENOKOT Take 2 tablets by mouth at bedtime.   traZODone 100 MG tablet Commonly known as: DESYREL Take 100 mg by mouth at bedtime.   UNABLE TO FIND Take by mouth in the morning and at bedtime. House Shake   ziprasidone 60 MG capsule  Commonly known as: GEODON Take 60 mg by mouth at bedtime.               Discharge Care Instructions  (From admission, onward)           Start     Ordered   12/27/22 0000  Discharge wound care:       Comments: Apply gel dressing as needed and frequently change posture   12/27/22 1206            Follow-up Information     [facility]. Schedule an appointment as soon as possible for a visit in 1 month(s).   Specialty: Neurology Why: stroke clinic Contact information: [address] [place] [address] [phone]        Robin Oaks, NP. Schedule an appointment as soon as possible for a visit in 1 week(s).   Specialty: Nurse Practitioner Contact information: [address] [place] [address] [phone]                Discharge Exam: Filed Weights   12/25/22 0305 12/26/22 0346 12/27/22 0400  Weight: 44.6 kg 47.2 kg 47.2 kg   General.  Malnourished elderly lady, in no acute distress. Pulmonary.  Lungs  clear bilaterally, normal respiratory effort. CV.  Regular rate and rhythm, no JVD, rub or murmur. Abdomen.  Soft, nontender, nondistended, BS positive. CNS.  Alert and oriented to self only.  No new focal neurologic deficit. Extremities.  No edema, no cyanosis, pulses intact and symmetrical. Psychiatry.  Judgment and insight appears impaired  Condition at discharge: stable  The results of significant diagnostics from this hospitalization (including imaging, microbiology, ancillary and laboratory) are listed below for reference.   Imaging Studies: CT ANGIO HEAD NECK W WO CM  Result Date: 12/26/2022 CLINICAL DATA:  Stroke/TIA, determine embolic source. EXAM: CT ANGIOGRAPHY HEAD AND NECK WITH AND WITHOUT CONTRAST TECHNIQUE: Multidetector CT imaging of the head and neck was performed using the standard protocol during bolus administration of intravenous contrast. Multiplanar CT image reconstructions and MIPs were obtained to evaluate the vascular anatomy. Carotid stenosis measurements (when applicable) are obtained utilizing NASCET criteria, using the distal internal carotid diameter as the denominator. RADIATION DOSE REDUCTION: This exam was performed according to the departmental dose-optimization program which includes automated exposure control, adjustment of the mA and/or kV according to patient size and/or use of iterative reconstruction technique. CONTRAST:  75mL OMNIPAQUE IOHEXOL 350 MG/ML SOLN COMPARISON:  Head MRI 12/23/2022 FINDINGS: CT HEAD FINDINGS Brain: A subcentimeter hypodensity is noted in the left centrum semiovale in the area of the known acute infarct which was better shown on MRI. An old infarct is noted in the right basal ganglia. Patchy hypodensities elsewhere in the cerebral white matter bilaterally are unchanged and nonspecific but compatible with moderate chronic small vessel ischemic disease. There is moderate cerebral atrophy. No acute intracranial hemorrhage, mass, midline  shift, or extra-axial fluid collection is identified. Vascular: Calcified atherosclerosis at the skull base. Skull: No acute fracture or suspicious osseous lesion. Sinuses/Orbits: Paranasal sinuses and mastoid air cells are clear. Unremarkable orbits. Other: None. Review of the MIP images confirms the above findings CTA NECK FINDINGS Aortic arch: Standard 3 vessel aortic arch with scattered calcified plaque as well as some soft plaque in the included proximal descending aorta. No significant stenosis of the arch vessel origins. Right carotid system: Patent with a moderate amount of calcified plaque in the proximal ICA resulting in less than  50% stenosis. Left carotid system: Patent with a large amount of calcified and soft plaque at the carotid bifurcation and in the proximal ICA resulting in less than 50% stenosis. Vertebral arteries: The vertebral arteries are patent with the left being strongly dominant. Extensive venous contrast limits assessment of the V1 and proximal V2 segments. Calcified plaque at the right vertebral origin may result in moderate to severe stenosis, however assessment is limited due to the small size of the hypoplastic vertebral artery and the density of the calcified plaque. Skeleton: C3-C7 ACDF and posterior fusion. Advanced disc and facet degeneration at T1-2. Other neck: No evidence of cervical lymphadenopathy or mass. Upper chest: Asymmetric peribronchial thickening and mild ground-glass opacities in the right lung apex. Review of the MIP images confirms the above findings CTA HEAD FINDINGS Anterior circulation: The internal carotid arteries are patent from skull base to carotid termini with relatively diffuse siphon calcification bilaterally not resulting in significant stenosis. ACAs and MCAs are patent without evidence of a proximal branch occlusion or significant A1 stenosis. There are moderate bilateral M1 stenoses. No aneurysm is identified. Posterior circulation: The intracranial  vertebral arteries are patent to the basilar with atherosclerotic irregularity resulting in up to mild stenosis on the right and a severe distal stenosis on the left. There are suspected severe stenoses of both AICA origins. The basilar artery is patent with mild-to-moderate stenosis proximally. There are robust posterior communicating arteries bilaterally with hypoplasia of the P1 segments as well as a suspected superimposed severe right P1 stenosis. There is also a severe left P2 stenosis. No aneurysm is identified. Venous sinuses: Absent opacification of the left sigmoid sinus and jugular bulb, favored to be secondary to contrast timing and diminished flow in the non dominant left-sided system given the maintenance of a flow void on the recent MRI. Anatomic variants: None. Review of the MIP images confirms the above findings IMPRESSION: 1. No large vessel occlusion. 2. Intracranial atherosclerosis including severe left V4, mild-to-moderate basilar, severe PCA, and moderate bilateral M1 stenoses. 3. Cervical carotid artery atherosclerosis without significant stenosis. 4. Asymmetric peribronchial thickening and mild ground-glass opacities in the right lung apex, likely infectious or inflammatory. 5.  Aortic Atherosclerosis (ICD10-I70.0). Electronically Signed   By: Sebastian Ache M.D.   On: 12/26/2022 16:41   VAS US CAROTID  Result Date: 12/24/2022 Carotid Arterial Duplex Study Patient Name:  BRYNAE QUINTAL  Date of Exam:   12/24/2022 Medical Rec #: [id number]           Accession #:    [id number] Date of Birth: 1946-04-12           Patient Gender: F Patient Age:   69 years Exam Location:  [facility] Procedure:      VAS US CAROTID Referring Phys: Terrilee Files [facility] --------------------------------------------------------------------------------  Indications:       CVA. Risk Factors:      Hypertension, Diabetes, prior CVA. Comparison Study:  No prior study Performing Technologist: Sherren Kerns RVS   Examination Guidelines: A complete evaluation includes B-mode imaging, spectral Doppler, color Doppler, and power Doppler as needed of all accessible portions of each vessel. Bilateral testing is considered an integral part of a complete examination. Limited examinations for reoccurring indications may be performed as noted.  Right Carotid Findings: +----------+--------+--------+--------+----------------------+--------+           PSV cm/sEDV cm/sStenosisPlaque Description    Comments +----------+--------+--------+--------+----------------------+--------+ CCA Prox  43      9  heterogenous                   +----------+--------+--------+--------+----------------------+--------+ CCA Distal43      7               heterogenous                   +----------+--------+--------+--------+----------------------+--------+ ICA Prox  53      14      1-39%   irregular and calcific         +----------+--------+--------+--------+----------------------+--------+ ICA Mid   72      18                                             +----------+--------+--------+--------+----------------------+--------+ ICA Distal58      18                                             +----------+--------+--------+--------+----------------------+--------+ ECA       67      6                                              +----------+--------+--------+--------+----------------------+--------+ +----------+--------+-------+--------+-------------------+           PSV cm/sEDV cmsDescribeArm Pressure (mmHG) +----------+--------+-------+--------+-------------------+ [id number]                                         +----------+--------+-------+--------+-------------------+ +---------+--------+--+--------+-+ VertebralPSV cm/s29EDV cm/s5 +---------+--------+--+--------+-+  Left Carotid Findings: +----------+--------+--------+--------+---------------------+------------------+            PSV cm/sEDV cm/sStenosisPlaque Description   Comments           +----------+--------+--------+--------+---------------------+------------------+ CCA Prox  62      12                                   intimal thickening +----------+--------+--------+--------+---------------------+------------------+ CCA Distal45      8                                    intimal thickening +----------+--------+--------+--------+---------------------+------------------+ ICA Prox  60      14      1-39%   irregular and                                                             calcific                                +----------+--------+--------+--------+---------------------+------------------+ ICA Mid   73      26                                                      +----------+--------+--------+--------+---------------------+------------------+  ICA Distal51      12                                                      +----------+--------+--------+--------+---------------------+------------------+ ECA       69      15                                                      +----------+--------+--------+--------+---------------------+------------------+ +----------+--------+--------+--------+-------------------+           PSV cm/sEDV cm/sDescribeArm Pressure (mmHG) +----------+--------+--------+--------+-------------------+ [id number]                                          +----------+--------+--------+--------+-------------------+ +---------+--------+--+--------+--+ VertebralPSV cm/s50EDV cm/s17 +---------+--------+--+--------+--+   Summary: Right Carotid: Velocities in the right ICA are consistent with a 1-39% stenosis. Left Carotid: Velocities in the left ICA are consistent with a 1-39% stenosis. Vertebrals:  Bilateral vertebral arteries demonstrate antegrade flow. Subclavians: Normal flow hemodynamics were seen in bilateral subclavian              arteries.  *See table(s) above for measurements and observations.     Preliminary    ECHOCARDIOGRAM COMPLETE  Result Date: 12/24/2022    ECHOCARDIOGRAM REPORT   Patient Name:   BRANDON MACKIEWICZ Date of Exam: 12/24/2022 Medical Rec #:  [id number]          Height:       63.0 in Accession #:    [id number]         Weight:       99.2 lb Date of Birth:  [birth year]          BSA:          1.435 m Patient Age:    76 years           BP:           131/82 mmHg Patient Gender: F                  HR:           67 bpm. Exam Location:  Inpatient Procedure: 2D Echo, Cardiac Doppler and Color Doppler Indications:    Pulmonary Embolus I26.09  History:        Patient has no prior history of Echocardiogram examinations.                 Risk Factors:Diabetes, Hypertension and Former Smoker.  Sonographer:    Dondra Prader RVT RCS Referring Phys: [id number] [facility] IMPRESSIONS  1. Appears to be narrow LVOT and partial SAM no LVOT gradient noted and no obvious sub valvular web. Left ventricular ejection fraction, by estimation, is 60 to 65%. The left ventricle has normal function. The left ventricle has no regional wall motion abnormalities. There is severe left ventricular hypertrophy. Left ventricular diastolic parameters are consistent with Grade I diastolic dysfunction (impaired relaxation).  2. Right ventricular systolic function is normal. The right ventricular size is normal.  3. The mitral valve is normal in structure. No evidence of mitral valve regurgitation. No evidence of mitral  stenosis.  4. The aortic valve is tricuspid. There is moderate calcification of the aortic valve. There is moderate thickening of the aortic valve. Aortic valve regurgitation is mild. Mild aortic valve stenosis.  5. The inferior vena cava is normal in size with greater than 50% respiratory variability, suggesting right atrial pressure of 3 mmHg. FINDINGS  Left Ventricle: Appears to be narrow LVOT and partial SAM no LVOT gradient noted and no obvious sub  valvular web. Left ventricular ejection fraction, by estimation, is 60 to 65%. The left ventricle has normal function. The left ventricle has no regional  wall motion abnormalities. The left ventricular internal cavity size was normal in size. There is severe left ventricular hypertrophy. Left ventricular diastolic parameters are consistent with Grade I diastolic dysfunction (impaired relaxation). Right Ventricle: The right ventricular size is normal. No increase in right ventricular wall thickness. Right ventricular systolic function is normal. Left Atrium: Left atrial size was normal in size. Right Atrium: Right atrial size was normal in size. Pericardium: There is no evidence of pericardial effusion. Mitral Valve: The mitral valve is normal in structure. No evidence of mitral valve regurgitation. No evidence of mitral valve stenosis. Tricuspid Valve: The tricuspid valve is normal in structure. Tricuspid valve regurgitation is mild . No evidence of tricuspid stenosis. Aortic Valve: The aortic valve is tricuspid. There is moderate calcification of the aortic valve. There is moderate thickening of the aortic valve. Aortic valve regurgitation is mild. Mild aortic stenosis is present. Aortic valve mean gradient measures 3.0 mmHg. Aortic valve peak gradient measures 6.8 mmHg. Aortic valve area, by VTI measures 1.71 cm. Pulmonic Valve: The pulmonic valve was normal in structure. Pulmonic valve regurgitation is mild. No evidence of pulmonic stenosis. Aorta: The aortic root is normal in size and structure. Venous: The inferior vena cava is normal in size with greater than 50% respiratory variability, suggesting right atrial pressure of 3 mmHg. IAS/Shunts: No atrial level shunt detected by color flow Doppler.  LEFT VENTRICLE PLAX 2D LVIDd:         3.50 cm   Diastology LVIDs:         2.20 cm   LV e' medial:    4.53 cm/s LV PW:         1.50 cm   LV E/e' medial:  7.8 LV IVS:        1.70 cm   LV e' lateral:   5.70 cm/s LVOT  diam:     1.40 cm   LV E/e' lateral: 6.2 LV SV:         39 LV SV Index:   27 LVOT Area:     1.54 cm  RIGHT VENTRICLE             IVC RV Basal diam:  3.10 cm     IVC diam: 0.90 cm RV Mid diam:    2.80 cm RV S prime:     10.10 cm/s TAPSE (M-mode): 2.4 cm LEFT ATRIUM             Index        RIGHT ATRIUM          Index LA diam:        2.80 cm 1.95 cm/m   RA Area:     8.40 cm LA Vol (A2C):   25.6 ml 17.83 ml/m  RA Volume:   15.20 ml 10.59 ml/m LA Vol (A4C):   20.9 ml 14.56 ml/m LA Biplane Vol: 23.1 ml 16.09 ml/m  AORTIC VALVE                    PULMONIC VALVE AV Area (Vmax):    1.78 cm     PV Vmax:          1.07 m/s AV Area (Vmean):   1.71 cm     PV Peak grad:     4.6 mmHg AV Area (VTI):     1.71 cm     PR End Diast Vel: 3.88 msec AV Vmax:           130.00 cm/s AV Vmean:          84.100 cm/s AV VTI:            0.227 m AV Peak Grad:      6.8 mmHg AV Mean Grad:      3.0 mmHg LVOT Vmax:         150.00 cm/s LVOT Vmean:        93.400 cm/s LVOT VTI:          0.252 m LVOT/AV VTI ratio: 1.11 AR Vena Contracta: 0.35 cm  AORTA Ao Root diam: 2.50 cm Ao Asc diam:  3.60 cm MITRAL VALVE               TRICUSPID VALVE MV Area (PHT): 3.17 cm    TR Peak grad:   14.0 mmHg MV Decel Time: 239 msec    TR Vmax:        187.00 cm/s MV E velocity: 35.30 cm/s MV A velocity: 59.10 cm/s  SHUNTS MV E/A ratio:  0.60        Systemic VTI:  0.25 m                            Systemic Diam: 1.40 cm Charlton Haws MD Electronically signed by Charlton Haws MD Signature Date/Time: 12/24/2022/11:16:59 AM    Final    MR BRAIN WO CONTRAST  Result Date: 12/23/2022 CLINICAL DATA:  Acute neurologic deficit EXAM: MRI HEAD WITHOUT CONTRAST TECHNIQUE: Multiplanar, multiecho pulse sequences of the brain and surrounding structures were obtained without intravenous contrast. COMPARISON:  01/26/2017 FINDINGS: Brain: There is a small area of subacute ischemia within the left frontal white matter, with no ADC correlate. Area of late subacute to chronic ischemia  at the right lentiform nucleus. No acute or chronic hemorrhage. There is multifocal hyperintense T2-weighted signal within the white matter. Generalized volume loss. The midline structures are normal. Vascular: Major flow voids are preserved. Skull and upper cervical spine: Normal calvarium and skull base. Visualized upper cervical spine and soft tissues are normal. Sinuses/Orbits:No paranasal sinus fluid levels or advanced mucosal thickening. No mastoid or middle ear effusion. Normal orbits. IMPRESSION: 1. Small area of subacute ischemia within the left frontal white matter. No hemorrhage or mass effect. 2. Area of late subacute to chronic ischemia at the right lentiform nucleus. Electronically Signed   By: Deatra Robinson M.D.   On: 12/23/2022 19:11   DG CHEST PORT 1 VIEW  Result Date: 12/22/2022 CLINICAL DATA:  Hyperglycemia. EXAM: PORTABLE CHEST 1 VIEW COMPARISON:  September 18, 2017 FINDINGS: The heart size and mediastinal contours are within normal limits. There is marked severity calcification of the thoracic aorta. Both lungs are clear. Multiple chronic right-sided rib fractures are seen. Stable postoperative changes are noted within the cervical spine. IMPRESSION: No active disease. Electronically Signed   By: Demetrius Revel.D.  On: 12/22/2022 18:29   CT Head Wo Contrast  Result Date: 12/22/2022 CLINICAL DATA:  Confusion EXAM: CT HEAD WITHOUT CONTRAST TECHNIQUE: Contiguous axial images were obtained from the base of the skull through the vertex without intravenous contrast. RADIATION DOSE REDUCTION: This exam was performed according to the departmental dose-optimization program which includes automated exposure control, adjustment of the mA and/or kV according to patient size and/or use of iterative reconstruction technique. COMPARISON:  Head CT 09/05/2021 FINDINGS: Brain: No acute territorial infarction, hemorrhage or intracranial mass. Atrophy and chronic small vessel ischemic changes of the  white matter. Small chronic right ganglial capsular infarct with minimal calcification. New focal hypodensity within the right posterior frontal white matter near the vertex, series 8 image 11 through 13. Ventricles are slightly. Vascular: No hyperdense vessels. Vertebral and carotid vascular calcifications. Calcifications in the region of the right MCA as before. Skull: No fracture Sinuses/Orbits: No acute finding. Other: None IMPRESSION: 1. New focal hypodensity within the right posterior frontal white matter near the vertex, indeterminate for acute to subacute infarct or small vessel disease. Consider correlation with MRI. 2. Atrophy and chronic small vessel ischemic changes of the white matter. Slight interval ventricular enlargement, probably related to atrophy progression. Electronically Signed   By: Jasmine Pang M.D.   On: 12/22/2022 18:17    Microbiology: Results for orders placed or performed during the hospital encounter of 12/22/22  Resp panel by RT-PCR (RSV, Flu A&B, Covid) Anterior Nasal Swab     Status: None   Collection Time: 12/22/22  5:21 PM   Specimen: Anterior Nasal Swab  Result Value Ref Range Status   SARS Coronavirus 2 by RT PCR NEGATIVE NEGATIVE Final   Influenza A by PCR NEGATIVE NEGATIVE Final   Influenza B by PCR NEGATIVE NEGATIVE Final    Comment: (NOTE) The Xpert Xpress SARS-CoV-2/FLU/RSV plus assay is intended as an aid in the diagnosis of influenza from Nasopharyngeal swab specimens and should not be used as a sole basis for treatment. Nasal washings and aspirates are unacceptable for Xpert Xpress SARS-CoV-2/FLU/RSV testing.  Fact Sheet for Patients: BloggerCourse.com  Fact Sheet for Healthcare Providers: SeriousBroker.it  This test is not yet approved or cleared by the [place] FDA and has been authorized for detection and/or diagnosis of SARS-CoV-2 by FDA under an Emergency Use Authorization (EUA). This  EUA will remain in effect (meaning this test can be used) for the duration of the COVID-19 declaration under Section 564(b)(1) of the Act, 21 U.S.C. section 360bbb-3(b)(1), unless the authorization is terminated or revoked.     Resp Syncytial Virus by PCR NEGATIVE NEGATIVE Final    Comment: (NOTE) Fact Sheet for Patients: BloggerCourse.com  Fact Sheet for Healthcare Providers: SeriousBroker.it  This test is not yet approved or cleared by the [place] FDA and has been authorized for detection and/or diagnosis of SARS-CoV-2 by FDA under an Emergency Use Authorization (EUA). This EUA will remain in effect (meaning this test can be used) for the duration of the COVID-19 declaration under Section 564(b)(1) of the Act, 21 U.S.C. section 360bbb-3(b)(1), unless the authorization is terminated or revoked.  Performed at [facility], [address] [place], [place] [address]   Culture, blood (Routine X 2) w Reflex to ID Panel     Status: None (Preliminary result)   Collection Time: 12/22/22  7:34 PM   Specimen: BLOOD  Result Value Ref Range Status   Specimen Description BLOOD SITE NOT SPECIFIED  Final   Special Requests  Final    BOTTLES DRAWN AEROBIC AND ANAEROBIC Blood Culture adequate volume   Culture   Final    NO GROWTH 4 DAYS Performed at [facility], [address] [place], [place] [address]    Report Status PENDING  Incomplete  Culture, blood (Routine X 2) w Reflex to ID Panel     Status: None (Preliminary result)   Collection Time: 12/22/22  7:35 PM   Specimen: BLOOD LEFT WRIST  Result Value Ref Range Status   Specimen Description BLOOD LEFT WRIST  Final   Special Requests   Final    BOTTLES DRAWN AEROBIC AND ANAEROBIC Blood Culture adequate volume   Culture   Final    NO GROWTH 4 DAYS Performed at [facility], [address], [place], [place] [address]    Report Status PENDING  Incomplete   MRSA Next Gen by PCR, Nasal     Status: None   Collection Time: 12/23/22  6:07 AM   Specimen: Nasal Mucosa; Nasal Swab  Result Value Ref Range Status   MRSA by PCR Next Gen NOT DETECTED NOT DETECTED Final    Comment: (NOTE) The GeneXpert MRSA Assay (FDA approved for NASAL specimens only), is one component of a comprehensive MRSA colonization surveillance program. It is not intended to diagnose MRSA infection nor to guide or monitor treatment for MRSA infections. Test performance is not FDA approved in patients less than 70 years old. Performed at [facility], [address], [place], [place] [address]     Labs: CBC: Recent Labs  Lab 12/22/22 1755 12/22/22 1844 12/25/22 0114 12/26/22 0048  WBC 14.2*  --  7.2 9.0  NEUTROABS 11.0*  --   --   --   HGB 12.5 13.3 10.3* 10.4*  HCT 41.9 39.0 31.6* 31.7*  MCV 93.5  --  87.5 88.3  PLT 172  --  74* 73*   Basic Metabolic Panel: Recent Labs  Lab 12/22/22 1755 12/22/22 1844 12/23/22 1115 12/23/22 1636 12/24/22 0032 12/25/22 0114 12/26/22 0048  NA 147*   < > 148* 144 143 137 133*  K 4.1   < > 4.1 4.2 3.8 3.6 4.3  CL 115*   < > 118* 115* 111 109 104  CO2 16*   < > 21* 20* 24 21* 21*  GLUCOSE 751*   < > 201* 244* 147* 86 216*  BUN 76*   < > 51* 47* 37* 24* 22  CREATININE 3.20*   < > 2.18* 2.01* 1.74* 1.30* 1.25*  CALCIUM 10.4*   < > 9.9 9.7 9.6 9.0 8.9  MG 2.5*  --   --   --   --   --   --   PHOS  --   --   --   --   --   --  2.1*   < > = values in this interval not displayed.   Liver Function Tests: Recent Labs  Lab 12/22/22 1755 12/26/22 0048  AST 27  --   ALT 31  --   ALKPHOS 123  --   BILITOT 0.4  --   PROT 6.9  --   ALBUMIN 3.5 2.6*   CBG: Recent Labs  Lab 12/26/22 1143 12/26/22 1646 12/26/22 2050 12/27/22 0613 12/27/22 1123  GLUCAP 189* 168* 181* 121* 165*    Discharge time spent: greater than 30 minutes.  This record has been created using Conservation officer, historic buildings. Errors have been  sought and corrected,but  may not always be located. Such creation errors do not reflect on the standard of care.   Signed: Arnetha Courser, MD [facility] 12/27/2022

## 2022-12-27 NOTE — TOC Progression Note (Signed)
Transition of Care [facility]) - Progression Note    Patient Details  Name: [name] MRN: [id number] Date of Birth: [birth year]  Transition of Care [facility]) CM/SW Contact  Leander Rams, LCSW Phone Number: 12/27/2022, 1:24 PM  Clinical Narrative:    CSW called [facility] to schedule transportation back to [facility]. CSW spoke Marylu Lund, who confirmed transportation would be here soon.   TOC will continue to follow   Expected Discharge Plan: Skilled Nursing Facility Barriers to Discharge: Continued Medical Work up  Expected Discharge Plan and Services In-house Referral: Clinical Social Work     Living arrangements for the past 2 months: Skilled Nursing Facility Expected Discharge Date: 12/27/22                                     Social Determinants of Health (SDOH) Interventions SDOH Screenings   Tobacco Use: Medium Risk (10/08/2021)    Readmission Risk Interventions     No data to display         Oletta Lamas, MSW, LCSWA, LCASA Transitions of Care  Clinical Social Worker I

## 2022-12-28 LAB — CULTURE, BLOOD (ROUTINE X 2)
Culture: NO GROWTH
Special Requests: ADEQUATE

## 2023-03-06 ENCOUNTER — Inpatient Hospital Stay: Payer: Medicare (Managed Care) | Admitting: Adult Health

## 2023-03-06 ENCOUNTER — Encounter: Payer: Self-pay | Admitting: Adult Health

## 2023-03-06 NOTE — Progress Notes (Deleted)
[facility] [address] [place]. [place] [address] [phone]       HOSPITAL FOLLOW UP NOTE  [name] Date of Birth:  16-Dec-1945 Medical Record Number:  [id number]   Reason for Referral:  hospital stroke follow up    SUBJECTIVE:   CHIEF COMPLAINT:  No chief complaint on file.   HPI:   [name] is a 77 y.o. female with history of DMII, Hypertension, HLD, prior stroke 2014 with residual dysarthria and dysphagia schizophrenia/depression, and B12 deficiency who presented to ED on 12/22/2022 with lethargy and confusion in the setting of hyperglycemia (glucose levels >1000) and found to have a new left frontal white matter stroke. Stroke etiology likely small vessel disease.  Recommended DAPT for 3 weeks and continue Crestor 20 mg daily, LDL 33.  Advise close PCP follow-up for uncontrolled DM, A1c 13.5, on metformin PTA but patient reported noncompliance and advised to restart.  Treated for hyperosmolar hyperglycemia by primary team with Lantus and advised outpatient PCP follow-up.  Discharged to SNF per therapy recommendations.        PERTINENT WORK UP  CT Right posterior frontal white matter near the vertex.  Chronic small vessel disease and Atrophy.    MRI  Subacute left frontal white matter infarct MRA  not able to tolerate CTA head and neck Intracranial atherosclerosis including severe left V4, mild-to-moderate basilar, severe PCA, and moderate bilateral M1 stenoses. Carotid Doppler unremarkable 2D Echo EF 60-65% LDL 33 HgbA1c 13.5    ROS:   14 system review of systems performed and negative with exception of ***  PMH:  Past Medical History:  Diagnosis Date   Anxiety    Cervical spondylosis with myelopathy    Complication of anesthesia    hard time waking up with last procedure years ago   COPD (chronic obstructive pulmonary disease) (HCC)    Depression    Diabetes mellitus without complication (HCC)    Diabetic  neuropathy (HCC)    Dyspnea    Headache    migraines   Hypertension    Memory deficit    Pancreatitis    Schizophrenia (HCC)    Stroke (HCC)    no deficits   Wears dentures    Wears glasses     PSH:  Past Surgical History:  Procedure Laterality Date   ANTERIOR CERVICAL DECOMPRESSION/DISCECTOMY FUSION 4 LEVELS N/A 04/01/2016   Procedure: ANTERIOR CERVICAL DECOMPRESSION/DISCECTOMY FUSION CERVICAL THREE- CERVICAL FOUR, CERVICAL FOUR-CERVICAL FIVE, CORPECTOMY C6;  Surgeon: Lisbeth Renshaw, MD;  Location: [facility];  Service: Neurosurgery;  Laterality: N/A;  ANTERIOR CERVICAL DECOMPRESSION/DISCECTOMY FUSION C3-C4, C4-C5,C5-C6,C6-C7   FOOT SURGERY Right    MULTIPLE TOOTH EXTRACTIONS     POSTERIOR CERVICAL FUSION/FORAMINOTOMY N/A 06/09/2016   Procedure: Cervical three-four, four-five, five-six, six-seven posterior cervical fusion;  Surgeon: Lisbeth Renshaw, MD;  Location: [facility] OR;  Service: Neurosurgery;  Laterality: N/A;   RHINOPLASTY     ROTATOR CUFF REPAIR     TONSILLECTOMY     TUBAL LIGATION      Social History:  Social History   Socioeconomic History   Marital status: Widowed    Spouse name: Not on file   Number of children: Not on file   Years of education: Not on file   Highest education level: Not on file  Occupational History   Not on file  Tobacco Use   Smoking status: Former    Types: Cigarettes   Smokeless tobacco: Never   Tobacco comments:  in 2013  Substance and Sexual Activity   Alcohol use: No    Alcohol/week: 0.0 standard drinks of alcohol   Drug use: No   Sexual activity: Not on file  Other Topics Concern   Not on file  Social History Narrative   Lives with 2 sisters in a one story home.  Retired from [employer] work.  Education: GED    Social Determinants of Corporate investment banker Strain: Not on file  Food Insecurity: Not on file  Transportation Needs: Not on file  Physical Activity: Not on file  Stress: Not on file  Social  Connections: Not on file  Intimate Partner Violence: Not on file    Family History: No family history on file.  Medications:   Current Outpatient Medications on File Prior to Visit  Medication Sig Dispense Refill   acetaminophen (TYLENOL) 500 MG tablet Take 500 mg by mouth in the morning and at bedtime. for shoulder pain     amLODipine-benazepril (LOTREL) 5-10 MG capsule Take 1 capsule by mouth daily.     aspirin EC 81 MG tablet Take 81 mg by mouth daily.     cyanocobalamin 1000 MCG tablet Take 1,000 mcg by mouth daily.     dextromethorphan-guaiFENesin (ROBITUSSIN-DM) 10-100 MG/5ML liquid Take 10 mLs by mouth every 6 (six) hours as needed for cough.     donepezil (ARICEPT) 10 MG tablet Take 10 mg by mouth at bedtime.     DULoxetine (CYMBALTA) 60 MG capsule Take 60 mg by mouth daily.     Ensure (ENSURE) Take 237 mLs by mouth 3 (three) times daily.     insulin glargine (LANTUS SOLOSTAR) 100 UNIT/ML Solostar Pen Inject 10 Units into the skin daily. 15 mL 11   Menthol, Topical Analgesic, 4 % GEL Apply 1 application  topically in the morning and at bedtime. Apply to right shoulder for pain     MENTHOL-ZINC OXIDE EX Apply 1 application  topically in the morning and at bedtime. Apply to sacrum and gluteal folds     metFORMIN (GLUCOPHAGE-XR) 500 MG 24 hr tablet Take 750 mg by mouth in the morning and at bedtime. (Patient not taking: Reported on 12/23/2022)     mirtazapine (REMERON) 15 MG tablet Take 15 mg by mouth at bedtime.     rosuvastatin (CRESTOR) 20 MG tablet Take 20 mg by mouth at bedtime.     senna (SENOKOT) 8.6 MG TABS tablet Take 2 tablets by mouth at bedtime.     traZODone (DESYREL) 100 MG tablet Take 100 mg by mouth at bedtime.     UNABLE TO FIND Take by mouth in the morning and at bedtime. House Shake     ziprasidone (GEODON) 60 MG capsule Take 60 mg by mouth at bedtime.     No current facility-administered medications on file prior to visit.    Allergies:   Allergies  Allergen  Reactions   Soy Allergy Other (See Comments)    Unknown reaction per MAR   Vitis Viniferae Other (See Comments)    Grapes and raisins - unknown reaction per MAR      OBJECTIVE:  Physical Exam  There were no vitals filed for this visit. There is no height or weight on file to calculate BMI. No results found.      No data to display           General: well developed, well nourished, seated, in no evident distress Head: head normocephalic and atraumatic.   Neck:  supple with no carotid or supraclavicular bruits Cardiovascular: regular rate and rhythm, no murmurs Musculoskeletal: no deformity Skin:  no rash/petichiae Vascular:  Normal pulses all extremities   Neurologic Exam Mental Status: Awake and fully alert. Oriented to place and time. Recent and remote memory intact. Attention span, concentration and fund of knowledge appropriate. Mood and affect appropriate.  Cranial Nerves: Fundoscopic exam reveals sharp disc margins. Pupils equal, briskly reactive to light. Extraocular movements full without nystagmus. Visual fields full to confrontation. Hearing intact. Facial sensation intact. Face, tongue, palate moves normally and symmetrically.  Motor: Normal bulk and tone. Normal strength in all tested extremity muscles Sensory.: intact to touch , pinprick , position and vibratory sensation.  Coordination: Rapid alternating movements normal in all extremities. Finger-to-nose and heel-to-shin performed accurately bilaterally. Gait and Station: Arises from chair without difficulty. Stance is normal. Gait demonstrates normal stride length and balance with ***. Tandem walk and heel toe ***.  Reflexes: 1+ and symmetric. Toes downgoing.     NIHSS  *** Modified Rankin  ***      ASSESSMENT: [name] is a 77 y.o. year old female with left frontal white matter small infarct 12/22/2022 likely secondary to small vessel disease. Vascular risk factors include HTN, HLD,  uncontrolled DM, hx of stroke 2014 with residual dysarthria and dysphagia and advanced age.      PLAN:  Left frontal stroke:  Residual deficit: ***.  Continue aspirin 81mg  daily and rosuvastatin (Crestor) for secondary stroke prevention.   Discussed secondary stroke prevention measures and importance of close PCP follow up for aggressive stroke risk factor management including BP goal<130/90, HLD with LDL goal<70 and DM with A1c.<7 .  Stroke labs 11/2022: LDL 33, A1c 13.5 I have gone over the pathophysiology of stroke, warning signs and symptoms, risk factors and their management in some detail with instructions to go to the closest emergency room for symptoms of concern.     Follow up in *** or call earlier if needed   CC:  [facility] provider: Dr. Pearlean Brownie PCP: Estevan Oaks, NP    I spent *** minutes of face-to-face and non-face-to-face time with patient.  This included previsit chart review including review of recent hospitalization, lab review, study review, order entry, electronic health record documentation, patient education regarding recent stroke including etiology, secondary stroke prevention measures and importance of managing stroke risk factors, residual deficits and typical recovery time and answered all other questions to patient satisfaction   Ihor Austin, AGNP-BC  [facility] [address] [place], [place] [address]  Phone [phone] Fax [phone] Note: This document was prepared with digital dictation and possible smart phrase technology. Any transcriptional errors that result from this process are unintentional.

## 2023-04-06 ENCOUNTER — Inpatient Hospital Stay: Payer: Medicare (Managed Care) | Admitting: Adult Health

## 2023-05-02 DEATH — deceased
